# Patient Record
Sex: Female | Born: 1945 | Race: White | Hispanic: No | Smoking: Never smoker
Health system: Southern US, Community
[De-identification: ages and names within clinical notes are randomized; demographics above are authoritative.]

## PROBLEM LIST (undated history)

## (undated) DIAGNOSIS — K219 Gastro-esophageal reflux disease without esophagitis: Secondary | ICD-10-CM

## (undated) DIAGNOSIS — T4145XA Adverse effect of unspecified anesthetic, initial encounter: Secondary | ICD-10-CM

## (undated) DIAGNOSIS — R569 Unspecified convulsions: Secondary | ICD-10-CM

## (undated) DIAGNOSIS — F419 Anxiety disorder, unspecified: Secondary | ICD-10-CM

## (undated) DIAGNOSIS — F329 Major depressive disorder, single episode, unspecified: Secondary | ICD-10-CM

## (undated) DIAGNOSIS — G9332 Myalgic encephalomyelitis/chronic fatigue syndrome: Secondary | ICD-10-CM

## (undated) DIAGNOSIS — H3530 Unspecified macular degeneration: Secondary | ICD-10-CM

## (undated) DIAGNOSIS — K5790 Diverticulosis of intestine, part unspecified, without perforation or abscess without bleeding: Secondary | ICD-10-CM

## (undated) DIAGNOSIS — Z8719 Personal history of other diseases of the digestive system: Secondary | ICD-10-CM

## (undated) DIAGNOSIS — T8859XA Other complications of anesthesia, initial encounter: Secondary | ICD-10-CM

## (undated) DIAGNOSIS — L309 Dermatitis, unspecified: Secondary | ICD-10-CM

## (undated) DIAGNOSIS — K589 Irritable bowel syndrome without diarrhea: Secondary | ICD-10-CM

## (undated) DIAGNOSIS — K648 Other hemorrhoids: Secondary | ICD-10-CM

## (undated) DIAGNOSIS — M1990 Unspecified osteoarthritis, unspecified site: Secondary | ICD-10-CM

## (undated) DIAGNOSIS — F32A Depression, unspecified: Secondary | ICD-10-CM

## (undated) DIAGNOSIS — R5382 Chronic fatigue, unspecified: Secondary | ICD-10-CM

## (undated) HISTORY — PX: THERAPEUTIC ABORTION: SHX798

## (undated) HISTORY — DX: Dermatitis, unspecified: L30.9

## (undated) HISTORY — DX: Diverticulosis of intestine, part unspecified, without perforation or abscess without bleeding: K57.90

## (undated) HISTORY — PX: BUNIONECTOMY: SHX129

## (undated) HISTORY — DX: Other hemorrhoids: K64.8

## (undated) HISTORY — PX: FACELIFT W/BLEPHAROPLASTY: SHX1568

## (undated) HISTORY — PX: BLADDER SUSPENSION: SHX72

## (undated) HISTORY — PX: ABDOMINAL HYSTERECTOMY: SHX81

## (undated) HISTORY — PX: NASAL RECONSTRUCTION: SHX2069

## (undated) HISTORY — PX: CATARACT EXTRACTION, BILATERAL: SHX1313

---

## 1999-11-22 ENCOUNTER — Emergency Department (HOSPITAL_COMMUNITY): Admission: EM | Admit: 1999-11-22 | Discharge: 1999-11-22 | Payer: Self-pay | Admitting: Emergency Medicine

## 2000-05-02 ENCOUNTER — Emergency Department (HOSPITAL_COMMUNITY): Admission: EM | Admit: 2000-05-02 | Discharge: 2000-05-02 | Payer: Self-pay | Admitting: *Deleted

## 2001-08-07 ENCOUNTER — Emergency Department (HOSPITAL_COMMUNITY): Admission: EM | Admit: 2001-08-07 | Discharge: 2001-08-07 | Payer: Self-pay | Admitting: Emergency Medicine

## 2001-11-03 ENCOUNTER — Ambulatory Visit (HOSPITAL_COMMUNITY): Admission: RE | Admit: 2001-11-03 | Discharge: 2001-11-03 | Payer: Self-pay | Admitting: Gastroenterology

## 2001-11-07 ENCOUNTER — Emergency Department (HOSPITAL_COMMUNITY): Admission: EM | Admit: 2001-11-07 | Discharge: 2001-11-07 | Payer: Self-pay | Admitting: Emergency Medicine

## 2002-07-01 ENCOUNTER — Emergency Department (HOSPITAL_COMMUNITY): Admission: EM | Admit: 2002-07-01 | Discharge: 2002-07-01 | Payer: Self-pay | Admitting: Emergency Medicine

## 2003-10-20 ENCOUNTER — Emergency Department (HOSPITAL_COMMUNITY): Admission: EM | Admit: 2003-10-20 | Discharge: 2003-10-20 | Payer: Self-pay | Admitting: Emergency Medicine

## 2003-10-25 ENCOUNTER — Encounter: Admission: RE | Admit: 2003-10-25 | Discharge: 2003-12-06 | Payer: Self-pay | Admitting: Podiatry

## 2003-11-23 ENCOUNTER — Encounter: Payer: Self-pay | Admitting: Gastroenterology

## 2003-11-30 ENCOUNTER — Ambulatory Visit (HOSPITAL_COMMUNITY): Admission: RE | Admit: 2003-11-30 | Discharge: 2003-11-30 | Payer: Self-pay | Admitting: Gastroenterology

## 2003-11-30 ENCOUNTER — Encounter: Payer: Self-pay | Admitting: Gastroenterology

## 2003-12-01 ENCOUNTER — Emergency Department (HOSPITAL_COMMUNITY): Admission: EM | Admit: 2003-12-01 | Discharge: 2003-12-01 | Payer: Self-pay | Admitting: Emergency Medicine

## 2003-12-16 ENCOUNTER — Other Ambulatory Visit: Admission: RE | Admit: 2003-12-16 | Discharge: 2003-12-16 | Payer: Self-pay | Admitting: Family Medicine

## 2003-12-16 ENCOUNTER — Encounter: Admission: RE | Admit: 2003-12-16 | Discharge: 2003-12-16 | Payer: Self-pay | Admitting: Family Medicine

## 2004-01-04 ENCOUNTER — Encounter: Admission: RE | Admit: 2004-01-04 | Discharge: 2004-01-04 | Payer: Self-pay | Admitting: Family Medicine

## 2004-01-05 ENCOUNTER — Encounter: Admission: RE | Admit: 2004-01-05 | Discharge: 2004-01-05 | Payer: Self-pay | Admitting: Family Medicine

## 2004-01-10 ENCOUNTER — Encounter: Admission: RE | Admit: 2004-01-10 | Discharge: 2004-01-10 | Payer: Self-pay | Admitting: Sports Medicine

## 2004-02-14 ENCOUNTER — Encounter: Admission: RE | Admit: 2004-02-14 | Discharge: 2004-02-14 | Payer: Self-pay | Admitting: Sports Medicine

## 2004-02-17 ENCOUNTER — Encounter: Admission: RE | Admit: 2004-02-17 | Discharge: 2004-02-17 | Payer: Self-pay | Admitting: Family Medicine

## 2004-03-09 ENCOUNTER — Encounter: Admission: RE | Admit: 2004-03-09 | Discharge: 2004-03-09 | Payer: Self-pay | Admitting: Family Medicine

## 2004-07-11 ENCOUNTER — Ambulatory Visit: Payer: Self-pay | Admitting: Family Medicine

## 2004-08-30 ENCOUNTER — Ambulatory Visit: Payer: Self-pay | Admitting: Family Medicine

## 2004-09-08 ENCOUNTER — Ambulatory Visit: Payer: Self-pay | Admitting: Sports Medicine

## 2004-09-08 ENCOUNTER — Encounter: Admission: RE | Admit: 2004-09-08 | Discharge: 2004-09-08 | Payer: Self-pay | Admitting: Sports Medicine

## 2004-09-26 ENCOUNTER — Ambulatory Visit: Payer: Self-pay | Admitting: Sports Medicine

## 2005-01-11 ENCOUNTER — Ambulatory Visit: Payer: Self-pay | Admitting: Family Medicine

## 2005-01-12 ENCOUNTER — Emergency Department (HOSPITAL_COMMUNITY): Admission: EM | Admit: 2005-01-12 | Discharge: 2005-01-12 | Payer: Self-pay | Admitting: Emergency Medicine

## 2005-01-19 ENCOUNTER — Ambulatory Visit: Payer: Self-pay | Admitting: Family Medicine

## 2005-01-26 ENCOUNTER — Ambulatory Visit: Payer: Self-pay | Admitting: Family Medicine

## 2005-04-02 ENCOUNTER — Emergency Department (HOSPITAL_COMMUNITY): Admission: EM | Admit: 2005-04-02 | Discharge: 2005-04-03 | Payer: Self-pay | Admitting: Emergency Medicine

## 2005-04-04 ENCOUNTER — Ambulatory Visit: Payer: Self-pay | Admitting: Family Medicine

## 2005-04-06 ENCOUNTER — Encounter: Admission: RE | Admit: 2005-04-06 | Discharge: 2005-04-06 | Payer: Self-pay | Admitting: Sports Medicine

## 2005-04-18 ENCOUNTER — Ambulatory Visit (HOSPITAL_COMMUNITY): Admission: RE | Admit: 2005-04-18 | Discharge: 2005-04-18 | Payer: Self-pay | Admitting: Orthopedic Surgery

## 2005-04-18 ENCOUNTER — Other Ambulatory Visit: Admission: RE | Admit: 2005-04-18 | Discharge: 2005-04-18 | Payer: Self-pay | Admitting: Obstetrics and Gynecology

## 2005-11-05 ENCOUNTER — Ambulatory Visit: Payer: Self-pay | Admitting: Gastroenterology

## 2005-11-07 ENCOUNTER — Ambulatory Visit: Payer: Self-pay | Admitting: Gastroenterology

## 2005-11-07 ENCOUNTER — Ambulatory Visit (HOSPITAL_COMMUNITY): Admission: RE | Admit: 2005-11-07 | Discharge: 2005-11-07 | Payer: Self-pay | Admitting: Gastroenterology

## 2005-11-07 ENCOUNTER — Encounter (INDEPENDENT_AMBULATORY_CARE_PROVIDER_SITE_OTHER): Payer: Self-pay | Admitting: Specialist

## 2005-11-18 ENCOUNTER — Encounter (INDEPENDENT_AMBULATORY_CARE_PROVIDER_SITE_OTHER): Payer: Self-pay | Admitting: *Deleted

## 2005-11-18 LAB — CONVERTED CEMR LAB

## 2005-11-21 ENCOUNTER — Emergency Department (HOSPITAL_COMMUNITY): Admission: EM | Admit: 2005-11-21 | Discharge: 2005-11-21 | Payer: Self-pay | Admitting: Family Medicine

## 2005-12-03 ENCOUNTER — Ambulatory Visit: Payer: Self-pay | Admitting: Sports Medicine

## 2005-12-03 ENCOUNTER — Other Ambulatory Visit: Admission: RE | Admit: 2005-12-03 | Discharge: 2005-12-03 | Payer: Self-pay | Admitting: Family Medicine

## 2005-12-10 ENCOUNTER — Ambulatory Visit: Payer: Self-pay | Admitting: Gastroenterology

## 2006-01-15 ENCOUNTER — Ambulatory Visit: Payer: Self-pay | Admitting: Gastroenterology

## 2006-06-10 ENCOUNTER — Ambulatory Visit: Payer: Self-pay | Admitting: Family Medicine

## 2006-10-17 DIAGNOSIS — F411 Generalized anxiety disorder: Secondary | ICD-10-CM | POA: Insufficient documentation

## 2006-10-17 DIAGNOSIS — K5732 Diverticulitis of large intestine without perforation or abscess without bleeding: Secondary | ICD-10-CM | POA: Insufficient documentation

## 2006-10-17 DIAGNOSIS — K219 Gastro-esophageal reflux disease without esophagitis: Secondary | ICD-10-CM | POA: Insufficient documentation

## 2006-10-17 DIAGNOSIS — K649 Unspecified hemorrhoids: Secondary | ICD-10-CM | POA: Insufficient documentation

## 2006-10-17 DIAGNOSIS — K589 Irritable bowel syndrome without diarrhea: Secondary | ICD-10-CM

## 2006-10-17 DIAGNOSIS — F339 Major depressive disorder, recurrent, unspecified: Secondary | ICD-10-CM | POA: Insufficient documentation

## 2006-10-18 ENCOUNTER — Encounter (INDEPENDENT_AMBULATORY_CARE_PROVIDER_SITE_OTHER): Payer: Self-pay | Admitting: *Deleted

## 2006-10-22 ENCOUNTER — Telehealth: Payer: Self-pay | Admitting: *Deleted

## 2006-10-23 ENCOUNTER — Ambulatory Visit: Payer: Self-pay | Admitting: Family Medicine

## 2006-10-23 DIAGNOSIS — M19049 Primary osteoarthritis, unspecified hand: Secondary | ICD-10-CM | POA: Insufficient documentation

## 2007-01-01 ENCOUNTER — Telehealth: Payer: Self-pay | Admitting: *Deleted

## 2007-01-03 ENCOUNTER — Encounter (INDEPENDENT_AMBULATORY_CARE_PROVIDER_SITE_OTHER): Payer: Self-pay | Admitting: *Deleted

## 2007-01-03 ENCOUNTER — Ambulatory Visit: Payer: Self-pay | Admitting: Family Medicine

## 2007-01-03 ENCOUNTER — Encounter (INDEPENDENT_AMBULATORY_CARE_PROVIDER_SITE_OTHER): Payer: Self-pay | Admitting: Family Medicine

## 2007-01-03 DIAGNOSIS — K2970 Gastritis, unspecified, without bleeding: Secondary | ICD-10-CM | POA: Insufficient documentation

## 2007-01-03 DIAGNOSIS — K2990 Gastroduodenitis, unspecified, without bleeding: Secondary | ICD-10-CM

## 2007-01-03 LAB — CONVERTED CEMR LAB
ALT: 15 units/L (ref 0–35)
AST: 22 units/L (ref 0–37)
Albumin: 4.4 g/dL (ref 3.5–5.2)
BUN: 22 mg/dL (ref 6–23)
CO2: 25 meq/L (ref 19–32)
Calcium: 9.7 mg/dL (ref 8.4–10.5)
Chloride: 106 meq/L (ref 96–112)
Potassium: 4.1 meq/L (ref 3.5–5.3)

## 2007-01-04 ENCOUNTER — Encounter (INDEPENDENT_AMBULATORY_CARE_PROVIDER_SITE_OTHER): Payer: Self-pay | Admitting: Family Medicine

## 2007-01-06 ENCOUNTER — Telehealth: Payer: Self-pay | Admitting: *Deleted

## 2007-01-10 ENCOUNTER — Telehealth: Payer: Self-pay | Admitting: *Deleted

## 2007-01-11 ENCOUNTER — Emergency Department (HOSPITAL_COMMUNITY): Admission: EM | Admit: 2007-01-11 | Discharge: 2007-01-11 | Payer: Self-pay | Admitting: Emergency Medicine

## 2007-02-11 ENCOUNTER — Ambulatory Visit: Payer: Self-pay | Admitting: Family Medicine

## 2007-02-24 ENCOUNTER — Emergency Department (HOSPITAL_COMMUNITY): Admission: EM | Admit: 2007-02-24 | Discharge: 2007-02-24 | Payer: Self-pay | Admitting: Family Medicine

## 2007-02-25 ENCOUNTER — Encounter (INDEPENDENT_AMBULATORY_CARE_PROVIDER_SITE_OTHER): Payer: Self-pay | Admitting: Family Medicine

## 2007-03-03 ENCOUNTER — Encounter (INDEPENDENT_AMBULATORY_CARE_PROVIDER_SITE_OTHER): Payer: Self-pay | Admitting: Family Medicine

## 2007-03-03 ENCOUNTER — Ambulatory Visit: Payer: Self-pay | Admitting: Family Medicine

## 2007-03-04 ENCOUNTER — Encounter (INDEPENDENT_AMBULATORY_CARE_PROVIDER_SITE_OTHER): Payer: Self-pay | Admitting: Family Medicine

## 2007-03-04 LAB — CONVERTED CEMR LAB
Cholesterol: 184 mg/dL (ref 0–200)
HDL: 41 mg/dL (ref >39)

## 2007-03-17 ENCOUNTER — Telehealth: Payer: Self-pay | Admitting: *Deleted

## 2007-03-18 ENCOUNTER — Encounter (INDEPENDENT_AMBULATORY_CARE_PROVIDER_SITE_OTHER): Payer: Self-pay | Admitting: *Deleted

## 2007-03-26 ENCOUNTER — Encounter (INDEPENDENT_AMBULATORY_CARE_PROVIDER_SITE_OTHER): Payer: Self-pay | Admitting: Family Medicine

## 2007-04-18 ENCOUNTER — Encounter (INDEPENDENT_AMBULATORY_CARE_PROVIDER_SITE_OTHER): Payer: Self-pay | Admitting: Family Medicine

## 2007-04-24 ENCOUNTER — Encounter (INDEPENDENT_AMBULATORY_CARE_PROVIDER_SITE_OTHER): Payer: Self-pay | Admitting: Family Medicine

## 2007-04-24 ENCOUNTER — Other Ambulatory Visit: Admission: RE | Admit: 2007-04-24 | Discharge: 2007-04-24 | Payer: Self-pay | Admitting: Family Medicine

## 2007-04-24 ENCOUNTER — Ambulatory Visit: Payer: Self-pay | Admitting: Sports Medicine

## 2007-04-24 DIAGNOSIS — R599 Enlarged lymph nodes, unspecified: Secondary | ICD-10-CM | POA: Insufficient documentation

## 2007-04-24 DIAGNOSIS — N329 Bladder disorder, unspecified: Secondary | ICD-10-CM | POA: Insufficient documentation

## 2007-04-24 LAB — CONVERTED CEMR LAB: Whiff Test: NEGATIVE

## 2007-04-28 ENCOUNTER — Telehealth: Payer: Self-pay | Admitting: *Deleted

## 2007-04-28 ENCOUNTER — Ambulatory Visit: Payer: Self-pay | Admitting: Family Medicine

## 2007-04-28 LAB — CONVERTED CEMR LAB
Bilirubin Urine: NEGATIVE
Glucose, Urine, Semiquant: NEGATIVE
Ketones, urine, test strip: NEGATIVE
Specific Gravity, Urine: 1.015
pH: 8

## 2007-04-30 ENCOUNTER — Ambulatory Visit: Payer: Self-pay | Admitting: Cardiology

## 2007-05-02 ENCOUNTER — Encounter: Admission: RE | Admit: 2007-05-02 | Discharge: 2007-05-02 | Payer: Self-pay | Admitting: Sports Medicine

## 2007-05-04 ENCOUNTER — Emergency Department (HOSPITAL_COMMUNITY): Admission: EM | Admit: 2007-05-04 | Discharge: 2007-05-04 | Payer: Self-pay | Admitting: Emergency Medicine

## 2007-06-12 ENCOUNTER — Telehealth (INDEPENDENT_AMBULATORY_CARE_PROVIDER_SITE_OTHER): Payer: Self-pay | Admitting: Family Medicine

## 2008-04-21 ENCOUNTER — Telehealth: Payer: Self-pay | Admitting: *Deleted

## 2008-04-28 ENCOUNTER — Telehealth: Payer: Self-pay | Admitting: *Deleted

## 2008-04-29 ENCOUNTER — Ambulatory Visit: Payer: Self-pay | Admitting: Family Medicine

## 2008-04-29 DIAGNOSIS — N8111 Cystocele, midline: Secondary | ICD-10-CM | POA: Insufficient documentation

## 2008-04-29 LAB — CONVERTED CEMR LAB
Bilirubin Urine: NEGATIVE
Blood in Urine, dipstick: NEGATIVE
Ketones, urine, test strip: NEGATIVE
Protein, U semiquant: NEGATIVE
Urobilinogen, UA: 0.2

## 2008-04-30 ENCOUNTER — Telehealth: Payer: Self-pay | Admitting: *Deleted

## 2008-05-11 ENCOUNTER — Encounter (INDEPENDENT_AMBULATORY_CARE_PROVIDER_SITE_OTHER): Payer: Self-pay | Admitting: Family Medicine

## 2008-06-03 ENCOUNTER — Ambulatory Visit: Payer: Self-pay | Admitting: Obstetrics & Gynecology

## 2008-06-03 ENCOUNTER — Other Ambulatory Visit: Admission: RE | Admit: 2008-06-03 | Discharge: 2008-06-03 | Payer: Self-pay | Admitting: Obstetrics & Gynecology

## 2008-06-04 ENCOUNTER — Encounter (INDEPENDENT_AMBULATORY_CARE_PROVIDER_SITE_OTHER): Payer: Self-pay | Admitting: Family Medicine

## 2008-06-04 ENCOUNTER — Ambulatory Visit: Payer: Self-pay | Admitting: Family Medicine

## 2008-06-04 ENCOUNTER — Other Ambulatory Visit: Admission: RE | Admit: 2008-06-04 | Discharge: 2008-06-04 | Payer: Self-pay | Admitting: Family Medicine

## 2008-06-11 ENCOUNTER — Encounter (INDEPENDENT_AMBULATORY_CARE_PROVIDER_SITE_OTHER): Payer: Self-pay | Admitting: Family Medicine

## 2008-06-11 ENCOUNTER — Ambulatory Visit (HOSPITAL_COMMUNITY): Admission: RE | Admit: 2008-06-11 | Discharge: 2008-06-11 | Payer: Self-pay | Admitting: Family Medicine

## 2008-06-11 ENCOUNTER — Ambulatory Visit: Payer: Self-pay | Admitting: Family Medicine

## 2008-06-14 ENCOUNTER — Ambulatory Visit (HOSPITAL_COMMUNITY): Admission: RE | Admit: 2008-06-14 | Discharge: 2008-06-14 | Payer: Self-pay | Admitting: Obstetrics and Gynecology

## 2008-07-08 ENCOUNTER — Telehealth (INDEPENDENT_AMBULATORY_CARE_PROVIDER_SITE_OTHER): Payer: Self-pay | Admitting: Family Medicine

## 2008-07-14 ENCOUNTER — Ambulatory Visit: Payer: Self-pay | Admitting: Obstetrics & Gynecology

## 2008-08-27 ENCOUNTER — Telehealth: Payer: Self-pay | Admitting: *Deleted

## 2008-10-16 ENCOUNTER — Telehealth (INDEPENDENT_AMBULATORY_CARE_PROVIDER_SITE_OTHER): Payer: Self-pay | Admitting: Family Medicine

## 2008-10-30 ENCOUNTER — Emergency Department (HOSPITAL_COMMUNITY): Admission: EM | Admit: 2008-10-30 | Discharge: 2008-10-30 | Payer: Self-pay | Admitting: Emergency Medicine

## 2008-11-24 ENCOUNTER — Ambulatory Visit: Payer: Self-pay | Admitting: Obstetrics and Gynecology

## 2008-11-25 ENCOUNTER — Encounter: Payer: Self-pay | Admitting: Obstetrics and Gynecology

## 2008-11-25 LAB — CONVERTED CEMR LAB
Clue Cells Wet Prep HPF POC: NONE SEEN
GC Probe Amp, Genital: NEGATIVE

## 2008-12-21 ENCOUNTER — Emergency Department (HOSPITAL_COMMUNITY): Admission: EM | Admit: 2008-12-21 | Discharge: 2008-12-21 | Payer: Self-pay | Admitting: Emergency Medicine

## 2009-01-10 ENCOUNTER — Encounter (INDEPENDENT_AMBULATORY_CARE_PROVIDER_SITE_OTHER): Payer: Self-pay | Admitting: Family Medicine

## 2009-07-01 ENCOUNTER — Emergency Department (HOSPITAL_COMMUNITY): Admission: EM | Admit: 2009-07-01 | Discharge: 2009-07-01 | Payer: Self-pay | Admitting: Emergency Medicine

## 2009-10-06 ENCOUNTER — Ambulatory Visit: Payer: Self-pay | Admitting: Family Medicine

## 2009-10-06 ENCOUNTER — Encounter (INDEPENDENT_AMBULATORY_CARE_PROVIDER_SITE_OTHER): Payer: Self-pay | Admitting: *Deleted

## 2009-10-07 ENCOUNTER — Ambulatory Visit: Payer: Self-pay | Admitting: Gastroenterology

## 2009-10-07 ENCOUNTER — Telehealth: Payer: Self-pay | Admitting: Gastroenterology

## 2009-10-07 DIAGNOSIS — R198 Other specified symptoms and signs involving the digestive system and abdomen: Secondary | ICD-10-CM

## 2009-10-10 ENCOUNTER — Ambulatory Visit: Payer: Self-pay | Admitting: Gastroenterology

## 2009-10-12 ENCOUNTER — Telehealth: Payer: Self-pay | Admitting: Gastroenterology

## 2009-10-17 ENCOUNTER — Telehealth: Payer: Self-pay | Admitting: Gastroenterology

## 2009-10-28 ENCOUNTER — Ambulatory Visit: Payer: Self-pay | Admitting: Gastroenterology

## 2009-11-14 ENCOUNTER — Ambulatory Visit: Payer: Self-pay | Admitting: Family Medicine

## 2009-11-14 DIAGNOSIS — R1012 Left upper quadrant pain: Secondary | ICD-10-CM

## 2009-11-14 LAB — CONVERTED CEMR LAB
Bilirubin Urine: NEGATIVE
Hemoglobin: 12.1 g/dL (ref 12.0–15.0)
Lymphocytes Relative: 37 % (ref 12–46)
Monocytes Absolute: 0.5 10*3/uL (ref 0.1–1.0)
Neutro Abs: 4.6 10*3/uL (ref 1.7–7.7)
Nitrite: NEGATIVE
Platelets: 265 10*3/uL (ref 150–400)
RDW: 12.5 % (ref 11.5–15.5)
WBC Urine, dipstick: NEGATIVE
pH: 6

## 2009-11-15 LAB — CONVERTED CEMR LAB
Albumin: 4.4 g/dL (ref 3.5–5.2)
BUN: 16 mg/dL (ref 6–23)
Calcium: 9.4 mg/dL (ref 8.4–10.5)
Chloride: 104 meq/L (ref 96–112)
Glucose, Bld: 116 mg/dL — ABNORMAL HIGH (ref 70–99)
Potassium: 4 meq/L (ref 3.5–5.3)
TSH: 6.298 microintl units/mL — ABNORMAL HIGH (ref 0.350–4.500)

## 2009-11-16 ENCOUNTER — Telehealth: Payer: Self-pay | Admitting: Family Medicine

## 2009-11-17 ENCOUNTER — Ambulatory Visit: Payer: Self-pay | Admitting: Family Medicine

## 2009-11-17 DIAGNOSIS — R5383 Other fatigue: Secondary | ICD-10-CM

## 2009-11-17 DIAGNOSIS — E559 Vitamin D deficiency, unspecified: Secondary | ICD-10-CM | POA: Insufficient documentation

## 2009-11-17 DIAGNOSIS — E079 Disorder of thyroid, unspecified: Secondary | ICD-10-CM | POA: Insufficient documentation

## 2009-11-17 DIAGNOSIS — R5381 Other malaise: Secondary | ICD-10-CM | POA: Insufficient documentation

## 2009-11-18 LAB — CONVERTED CEMR LAB
Free T4: 0.9 ng/dL (ref 0.80–1.80)
T3, Free: 2.9 pg/mL (ref 2.3–4.2)

## 2009-11-21 ENCOUNTER — Encounter: Payer: Self-pay | Admitting: Family Medicine

## 2009-11-22 ENCOUNTER — Encounter: Payer: Self-pay | Admitting: Family Medicine

## 2009-11-22 LAB — CONVERTED CEMR LAB
Iron: 92 ug/dL (ref 42–145)
Vitamin B-12: 661 pg/mL (ref 211–911)

## 2009-11-23 LAB — CONVERTED CEMR LAB
Basophils Absolute: 0 10*3/uL (ref 0.0–0.1)
Cholesterol: 194 mg/dL (ref 0–200)
Eosinophils Relative: 2 % (ref 0–5)
LDL Cholesterol: 132 mg/dL — ABNORMAL HIGH (ref 0–99)
Lymphocytes Relative: 40 % (ref 12–46)
Neutro Abs: 3.3 10*3/uL (ref 1.7–7.7)
Platelets: 250 10*3/uL (ref 150–400)
RDW: 13.1 % (ref 11.5–15.5)
Triglycerides: 74 mg/dL (ref <150)
Vit D, 25-Hydroxy: 32 ng/mL (ref 30–89)

## 2009-11-25 ENCOUNTER — Emergency Department (HOSPITAL_COMMUNITY): Admission: EM | Admit: 2009-11-25 | Discharge: 2009-11-25 | Payer: Self-pay | Admitting: Emergency Medicine

## 2009-12-01 ENCOUNTER — Ambulatory Visit: Payer: Self-pay | Admitting: Obstetrics & Gynecology

## 2009-12-12 ENCOUNTER — Ambulatory Visit (HOSPITAL_COMMUNITY): Admission: RE | Admit: 2009-12-12 | Discharge: 2009-12-12 | Payer: Self-pay | Admitting: Obstetrics & Gynecology

## 2009-12-22 ENCOUNTER — Encounter: Admission: RE | Admit: 2009-12-22 | Discharge: 2009-12-22 | Payer: Self-pay | Admitting: Obstetrics & Gynecology

## 2009-12-26 ENCOUNTER — Encounter: Payer: Self-pay | Admitting: Obstetrics & Gynecology

## 2009-12-26 ENCOUNTER — Inpatient Hospital Stay (HOSPITAL_COMMUNITY): Admission: RE | Admit: 2009-12-26 | Discharge: 2009-12-27 | Payer: Self-pay | Admitting: Obstetrics & Gynecology

## 2009-12-26 ENCOUNTER — Ambulatory Visit: Payer: Self-pay | Admitting: Obstetrics & Gynecology

## 2009-12-29 ENCOUNTER — Inpatient Hospital Stay (HOSPITAL_COMMUNITY): Admission: AD | Admit: 2009-12-29 | Discharge: 2009-12-29 | Payer: Self-pay | Admitting: Obstetrics & Gynecology

## 2009-12-29 ENCOUNTER — Ambulatory Visit: Payer: Self-pay | Admitting: Family Medicine

## 2010-01-02 ENCOUNTER — Ambulatory Visit: Payer: Self-pay | Admitting: Obstetrics & Gynecology

## 2010-01-03 ENCOUNTER — Inpatient Hospital Stay (HOSPITAL_COMMUNITY): Admission: AD | Admit: 2010-01-03 | Discharge: 2010-01-03 | Payer: Self-pay | Admitting: Obstetrics and Gynecology

## 2010-01-03 ENCOUNTER — Ambulatory Visit: Payer: Self-pay | Admitting: Family

## 2010-01-11 ENCOUNTER — Ambulatory Visit: Payer: Self-pay | Admitting: Obstetrics & Gynecology

## 2010-01-12 ENCOUNTER — Encounter: Payer: Self-pay | Admitting: Obstetrics & Gynecology

## 2010-02-07 ENCOUNTER — Ambulatory Visit: Payer: Self-pay | Admitting: Obstetrics & Gynecology

## 2010-02-28 ENCOUNTER — Encounter: Admission: RE | Admit: 2010-02-28 | Discharge: 2010-02-28 | Payer: Self-pay | Admitting: Family Medicine

## 2010-02-28 ENCOUNTER — Ambulatory Visit: Payer: Self-pay | Admitting: Family Medicine

## 2010-02-28 DIAGNOSIS — R1031 Right lower quadrant pain: Secondary | ICD-10-CM

## 2010-02-28 DIAGNOSIS — R339 Retention of urine, unspecified: Secondary | ICD-10-CM

## 2010-03-13 ENCOUNTER — Encounter: Payer: Self-pay | Admitting: Family Medicine

## 2010-06-30 ENCOUNTER — Ambulatory Visit: Payer: Self-pay | Admitting: Family Medicine

## 2010-06-30 DIAGNOSIS — R300 Dysuria: Secondary | ICD-10-CM

## 2010-06-30 LAB — CONVERTED CEMR LAB
Ketones, urine, test strip: NEGATIVE
Protein, U semiquant: NEGATIVE
pH: 6

## 2010-07-01 ENCOUNTER — Encounter: Payer: Self-pay | Admitting: Family Medicine

## 2010-07-05 ENCOUNTER — Encounter: Payer: Self-pay | Admitting: Family Medicine

## 2010-07-25 ENCOUNTER — Telehealth: Payer: Self-pay | Admitting: Family Medicine

## 2010-08-01 ENCOUNTER — Ambulatory Visit: Payer: Self-pay | Admitting: Obstetrics & Gynecology

## 2010-08-02 ENCOUNTER — Encounter: Payer: Self-pay | Admitting: Obstetrics & Gynecology

## 2010-09-21 NOTE — Progress Notes (Signed)
  Phone Note Call from Patient Call back at Home Phone [phone]   Caller: Patient Reason for Call: Lab or Test Results Details of Action Taken: Referred to Dr. Patrcia Dolly office Summary of Call: I spoke with Shelley [name] on 11/16/09 in regards to her lab work. I informed her of her thyroid level and the note you added to the results. After explaining the symptoms of hypothyroidism she said they fit her exactly. She does not have a PCP so I referred her to Dr. Shelah Lewandowsky office for possible treatment and told her I would inform you of our call.  Initial call taken by: Lajean Saver RN,  November 16, 2009 1:42 PM

## 2010-09-21 NOTE — Assessment & Plan Note (Signed)
Summary: Frequent, painful urination x 1 wk rm 4   Vital Signs:  Patient Profile:   65 Years Old Female CC:      Frequent, painful urination x 1 wk Height:     64.25 inches Weight:      148 pounds O2 Sat:      100 % O2 treatment:    Room Air Temp:     98.0 degrees F oral Pulse rate:   84 / minute Pulse rhythm:   regular Resp:     16 per minute BP sitting:   109 / 75  (left arm) Cuff size:   regular  Vitals Entered By: Areta Haber CMA (June 30, 2010 4:27 PM)                  Current Allergies: ! PCN ! ASA ! AMOXICILLIN ! DEMEROL ! BETADINE     History of Present Illness Chief Complaint: Frequent, painful urination x 1 wk History of Present Illness:  Subjective:  Patient complains of increased dysuria for about a week.  She has a history of chronic urinary retention and incontinence, followed by [facility].  She normally catheterizes herself, and has been having increased discomfort.  No fevers, chills, and sweats.  No nausea/vomiting.  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) BLADDER PROLAPSE (ICD-596.9) SYMPTOM, ENLARGEMENT, LYMPH NODES (ICD-785.6) GASTRITIS (ICD-535.50) OSTEOARTHROSIS NOS, HAND (ICD-715.94) IRRITABLE BOWEL SYNDROME (ICD-564.1) HEMORRHOIDS, NOS (ICD-455.6) GASTROESOPHAGEAL REFLUX, NO ESOPHAGITIS (ICD-530.81) DIVERTICULITIS OF COLON, NOS (ICD-562.11) DEPRESSION, MAJOR, RECURRENT (ICD-296.30) ANXIETY (ICD-300.00)   Current Meds AMITRIPTYLINE HCL 150 MG TABS (AMITRIPTYLINE HCL) Take 1 tablet by mouth at bedtime PEPTO-BISMOL 262 MG TABS (BISMUTH  SUBSALICYLATE) Take as needed GAS-X EXTRA STRENGTH 125 MG CAPS (SIMETHICONE) Take as needed FAMOTIDINE 40 MG TABS (FAMOTIDINE) one tablet by mouth once daily ATIVAN 0.5 MG TABS (LORAZEPAM) Take 1 tab by mouth three times a day  as needed anxiety MACROBID 100 MG CAPS (NITROFURANTOIN MONOHYD MACRO) 1 by mouth q12hr with food PYRIDIUM 200 MG TABS (PHENAZOPYRIDINE HCL) 1 by mouth three times a day pc  REVIEW OF SYSTEMS Constitutional Symptoms      Denies fever, chills, night sweats, weight loss, weight gain, and fatigue.  Eyes       Denies change in vision, eye pain, eye discharge, glasses, contact lenses, and eye surgery. Ear/Nose/Throat/Mouth       Denies hearing loss/aids, change in hearing, ear pain, ear discharge, dizziness, frequent runny nose, frequent nose bleeds, sinus problems, sore throat, hoarseness, and tooth pain or bleeding.  Respiratory       Denies dry cough, productive cough, wheezing, shortness of breath, asthma, bronchitis, and emphysema/COPD.  Cardiovascular       Denies murmurs, chest pain, and tires easily with exhertion.    Gastrointestinal       Denies stomach pain, nausea/vomiting, diarrhea, constipation, blood in bowel movements, and indigestion. Genitourniary       Complains of painful urination.      Denies kidney stones and loss of urinary control.      Comments: Frequent x 1 wk Neurological       Denies paralysis, seizures, and fainting/blackouts. Musculoskeletal       Denies muscle pain, joint pain, joint stiffness, decreased range of motion, redness, swelling,  muscle weakness, and gout.  Skin       Denies bruising, unusual mles/lumps or sores, and hair/skin or nail changes.  Psych       Denies mood changes, temper/anger issues, anxiety/stress, speech problems, depression, and sleep problems. Other Comments: Pt states she has been out of town x 2wks and has not seen her Urologist or PCP for this.   Past History:  Past Medical History: Last updated:  11/17/2009 Depression, Major recurrent--  [facility] in [place] Anxiety GERD IBS Diverticuliis of colon Hemmerhoids Osteoarthritis ? 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) VAGINAL DISCHARGE (ICD-623.5) DYSURIA (ICD-788.1) BLADDER PROLAPSE (ICD-596.9) SCREENING FOR MALIGNANT NEOPLASM, CERVIX (ICD-V76.2) SYMPTOM, ENLARGEMENT, LYMPH NODES (ICD-785.6) HEALTH SCREENING (ICD-V70.0) GASTRITIS (ICD-535.50) OSTEOARTHROSIS NOS, HAND (ICD-715.94) IRRITABLE BOWEL SYNDROME (ICD-564.1) HEMORRHOIDS, NOS (ICD-455.6) GASTROESOPHAGEAL REFLUX, NO ESOPHAGITIS (ICD-530.81) DIVERTICULITIS OF COLON, NOS (ICD-562.11) DEPRESSION, MAJOR, RECURRENT (ICD-296.30) ANXIETY (ICD-300.00)    Past Surgical History: Last updated: 10/07/2009 Bunion surgery bilateral - 12/16/2003 Colonoscopy -  diverticuli - 12/16/2003,  Endoscopy - 12/16/2003, 12/2006-mild H. Pylori gastritis esophagial dilation - 12/16/2003,  hemorroid banding - 12/16/2003,  Lipoma removal axillary `79/84 - 12/16/2003 rhinoplasty  Family History: Last updated: 10/07/2009 dad with chf, htn, stroke? Mom had COPD. Dad and brother with Gout. Maternal GM w/ CVA Family History of Esophageal Cancer: Cousin No FH of Colon Cancer: Family History of Colon Polyps: Mother, Brother Family History of Stomach Cancer: Maternal Great Grandmother, Uncle??? Family History of Diabetes: Father, Paternal Aunts, Brother Family History of Heart Disease: Father Family History of Clotting disorder: Brother  Social History: Last updated: 10/07/2009 exercises by Sarina Ser, ab lounge.  on disability;  going through a very compicated difficult divorce. Patient has never smoked.  Alcohol Use - no Illicit Drug Use - no  Risk Factors: Smoking Status: never (10/07/2009)   Objective:  No acute distress  Eyes:  Pupils are equal, round, and reactive to light and  accomdation.  Extraocular movement is intact.  Conjunctivae are not inflamed.  Mouth:  moist mucous membranes  Neck:  Supple.  No adenopathy is present. Lungs:  Clear to auscultation.  Breath sounds are equal.  Heart:  Regular rate and rhythm without murmurs, rubs, or gallops.  Abdomen:   Mild suprapubic tenderness without masses or hepatosplenomegaly.  Bowel sounds are present.  No CVA or flank tenderness.  urinalysis (dipstick):  trace blood, +1 leuks Assessment New Problems: DYSURIA (ICD-788.1)  SUSPECT UTI  Plan New Medications/Changes: PYRIDIUM 200 MG TABS (PHENAZOPYRIDINE HCL) 1 by mouth three times a day pc  #9 x 0, 06/30/2010, Donna Christen MD MACROBID 100 MG CAPS (NITROFURANTOIN MONOHYD MACRO) 1 by mouth q12hr with food  #14 x 0, 06/30/2010, Donna Christen MD  New Orders: Urinalysis [81003-65000] T-Culture, Urine [[id number]] Est. Patient Level III [35009] Planning Comments:   Culture urine. Begin Macrobid and Pyridium. Follow-up with [facility].  To ER for increasing pain, fever, etc.   The patient and/or caregiver has been counseled thoroughly with regard to medications prescribed including dosage, schedule, interactions, rationale for use, and possible side effects and they verbalize understanding.  Diagnoses and expected course of recovery discussed and will return if not improved as expected or if the condition worsens. Patient and/or caregiver verbalized understanding.  Prescriptions: PYRIDIUM 200 MG TABS (PHENAZOPYRIDINE HCL) 1 by mouth three times a day pc  #9 x 0   Entered and Authorized by:   Donna Christen MD  Signed by:   Donna Christen MD on 06/30/2010   Method used:   Print then Give to Patient   RxID:   [id number] MACROBID 100 MG CAPS (NITROFURANTOIN MONOHYD MACRO) 1 by mouth q12hr with food  #14 x 0   Entered and Authorized by:   Donna Christen MD   Signed by:   Donna Christen MD on 06/30/2010   Method used:   Print then Give to  Patient   RxID:   [id number]   Orders Added: 1)  Urinalysis [81003-65000] 2)  T-Culture, Urine [[id number]] 3)  Est. Patient Level III [63875]    Laboratory Results   Urine Tests  Date/Time Received: June 30, 2010 4:50 PM  Date/Time Reported: June 30, 2010 4:50 PM   Routine Urinalysis   Color: lt. yellow Appearance: Hazy Glucose: negative   (Normal Range: Negative) Bilirubin: negative   (Normal Range: Negative) Ketone: negative   (Normal Range: Negative) Spec. Gravity: 1.015   (Normal Range: 1.003-1.035) Blood: trace-intact   (Normal Range: Negative) pH: 6.0   (Normal Range: 5.0-8.0) Protein: negative   (Normal Range: Negative) Urobilinogen: 0.2   (Normal Range: 0-1) Nitrite: negative   (Normal Range: Negative) Leukocyte Esterace: small   (Normal Range: Negative)

## 2010-09-21 NOTE — Assessment & Plan Note (Signed)
Summary: voiding problems   Vital Signs:  Patient profile:   65 year old female Height:      64.25 inches Weight:      133 pounds BMI:     22.73 O2 Sat:      96 % on Room air Pulse rate:   101 / minute BP sitting:   110 / 71  (left arm) Cuff size:   regular  Vitals Entered By: Payton Spark CMA (February 28, 2010 1:38 PM)  O2 Flow:  Room air CC: f/u thyroid   Primary Care Provider:  Seymour Bars DO  CC:  f/u thyroid.  History of Present Illness: 65 yo WF presents for f/u borderline hypothyroidism.  She never did start on her Synthroid 25 micrograms/ day and is still tired.    She had her bladder  sling with vaginal hysterectomy by Dr Marice Potter in May.  She is healing up but still having troiuble with leaking urine and getting her bladder to empty completely.  She is also having frequent stools and some constipation which is new along with RLQ pain on and off and more frequent stools.  Denies any vag bleeding but has some irritation and pain.  Denies rectal bleeding or blood in the urine.    She had a colonoscopy in Feb.  with Dr Arlyce Dice which showed diverticulosis and constipation.     Current Medications (verified): 1)  Amitriptyline Hcl 150 Mg Tabs (Amitriptyline Hcl) .... Take 1 Tablet By Mouth At Bedtime 2)  Pepto-Bismol 262 Mg Tabs (Bismuth Subsalicylate) .... Take As Needed 3)  Gas-X Extra Strength 125 Mg Caps (Simethicone) .... Take As Needed 4)  Famotidine 40 Mg Tabs (Famotidine) .... One Tablet By Mouth Once Daily 5)  Ativan 0.5 Mg Tabs (Lorazepam) .... Take 1 Tab By Mouth Three Times A Day  As Needed Anxiety 6)  Synthroid 25 Mcg Tabs (Levothyroxine Sodium) .Marland Kitchen.. 1 Tab By Mouth Daily As Directed  Allergies (verified): 1)  ! Pcn 2)  ! Asa 3)  ! Amoxicillin 4)  ! Demerol 5)  ! Betadine  Past History:  Past Medical History: Reviewed history from 11/17/2009 and no changes required. Depression, Major recurrent--  [facility] in [place] Anxiety GERD IBS Diverticuliis of  colon Hemmerhoids Osteoarthritis ? 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 Bunion surgery bilateral - 12/16/2003 Colonoscopy -  diverticuli - 12/16/2003,  Endoscopy - 12/16/2003, 12/2006-mild H. Pylori gastritis esophagial dilation - 12/16/2003,  hemorroid banding - 12/16/2003,  Lipoma removal axillary `79/84 - 12/16/2003 rhinoplasty  Social History: Reviewed history from 10/07/2009 and no changes required. 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 Patient has never smoked.  Alcohol Use - no Illicit Drug Use - no  Review of Systems      See HPI  Physical Exam  General:  alert, well-developed, well-nourished, and well-hydrated.   Head:  normocephalic and atraumatic.   Eyes:  sclera non icteric Mouth:  pharynx pink and moist.   Neck:  no masses.  Lungs:  Normal respiratory effort, chest expands symmetrically. Lungs are clear to auscultation, no crackles or wheezes. Heart:  Normal rate and regular rhythm. S1 and S2 normal without gallop, murmur, click, rub or other extra sounds. Abdomen:  RLQ with vol guarding on deep palpation.  ND.  soft.  No HSM.  NABS Extremities:  no LE edema Skin:  color normal.   Psych:  good eye contact, not anxious appearing,  and not depressed appearing.     Impression & Recommendations:  Problem # 1:  RLQ PAIN (ICD-789.03) Started after surgery.  DDx includes constipation, adhesions.  Will get a flat/ upright abdomen due to recent surgery.  No sign of acute abdomen.  REviewed her recent colonoscopy with Dr Arlyce Dice in Feb.  May need to go back to see him if not improving. Her updated medication list for this problem includes:    Gas-x Extra Strength 125 Mg Caps (Simethicone) .Marland Kitchen... Take as needed  Orders: T-DG ABD 2 Views [phone])  Problem # 2:  INCOMPLETE VOIDING (ICD-788.21) After bladder sling, she continues to have some stress incontinence and incomplete emptying.  Will refer to urology for urodynamic studies. Orders: Urology Referral (Urology)  Problem # 3:  THYROID STIMULATING HORMONE, ABNORMAL (ICD-246.9) Subclinical hypothyroidism.  Given the option to start low dose synthroid a few mos ago due to fatigue but she never did fill the RX.    Complete Medication List: 1)  Amitriptyline Hcl 150 Mg Tabs (Amitriptyline hcl) .... Take 1 tablet by mouth at bedtime 2)  Pepto-bismol 262 Mg Tabs (Bismuth subsalicylate) .... Take as needed 3)  Gas-x Extra Strength 125 Mg Caps (Simethicone) .... Take as needed 4)  Famotidine 40 Mg Tabs (Famotidine) .... One tablet by mouth once daily 5)  Ativan 0.5 Mg Tabs (Lorazepam) .... Take 1 tab by mouth three times a day  as needed anxiety  Patient Instructions: 1)  Xray Abdomen downstairs today. 2)  Will call you w/ results tomorrow. 3)  Will get you in with urologist for voiding issues. 4)  I will send a note to Dr Arlyce Dice about your ongoing GI issues.

## 2010-09-21 NOTE — Procedures (Signed)
Summary: Colonoscopy  Patient: [name] Note: All result statuses are Final unless otherwise noted.  Tests: (1) Colonoscopy (COL)   COL Colonoscopy           DONE (C)     [facility]     [address]     [place], [place]  [address]           COLONOSCOPY PROCEDURE REPORT           PATIENT:  Shelley [name], Shelley [name]  MR#:  [id number]     BIRTHDATE:  1945/12/23, 63 yrs. old  GENDER:  female           ENDOSCOPIST:  Barbette Hair. Arlyce Dice, MD     Referred by:           PROCEDURE DATE:  10/10/2009     PROCEDURE:  Colonoscopy, Diagnostic     ASA CLASS:  Class II     INDICATIONS:  constipation           MEDICATIONS:   Fentanyl 75 mcg IV, Versed 10 mg IV, benadryl 25mg      IV (correction)           DESCRIPTION OF PROCEDURE:   After the risks benefits and     alternatives of the procedure were thoroughly explained, informed     consent was obtained.  Digital rectal exam was performed and     revealed no abnormalities.   The LB [id number] endoscope     was introduced through the anus and advanced to the cecum, which     was identified by both the appendix and ileocecal valve, without     limitations.  The quality of the prep was Moviprep fair.  The     instrument was then slowly withdrawn as the colon was fully     examined.     <<PROCEDUREIMAGES>>           FINDINGS:  Mild diverticulosis was found sigmoid to descending     This was otherwise a normal examination of the colon (see image2,     image3, image5, image6, image8, image9, image12, and image13).     Retroflexed views in the rectum revealed no abnormalities.    The     scope was then withdrawn from the patient and the procedure     completed.           COMPLICATIONS:  None           ENDOSCOPIC IMPRESSION:     1) Mild diverticulosis in the sigmoid to descending     2) Otherwise normal examination     3) Functional constipation     RECOMMENDATIONS:     1) call office next 1-3 days to schedule followup visit in  2-3     weeks     2) begin miralax 1-2 times daily every 3 days as needed           REPEAT EXAM:  No           ______________________________     Barbette Hair. Arlyce Dice, MD           CC:           n.     REVISED:  10/26/2009 10:41 AM     eSIGNED:   Barbette Hair. Kaplan at 10/26/2009 10:41 AM           Shelley [name], [id number]  Note: An exclamation mark (!) indicates a result that was not  dispersed into the flowsheet. Document Creation Date: 10/26/2009 10:42 AM _______________________________________________________________________  (1) Order result status: Final Collection or observation date-time: 10/10/2009 15:37 Requested date-time:  Receipt date-time:  Reported date-time:  Referring Physician:   Ordering Physician: Melvia Heaps [phone]) Specimen Source:  Source: Shelley [name] Order Number: [id number] Lab site:

## 2010-09-21 NOTE — Procedures (Signed)
Summary: Colonoscopy/[facility]  Colonoscopy/[facility]   Imported By: Sherian Rein 10/11/2009 09:43:43  _____________________________________________________________________  External Attachment:    Type:   Image     Comment:   External Document

## 2010-09-21 NOTE — Letter (Signed)
Summary: [facility] Instructions  [facility]  [address] [place], [place] [address]   Phone: [phone]  Fax: [phone]       Shelley [name]    1945-10-09    MRN: [id number]        Procedure Day /Date:MONDAY 10/10/2009     Arrival Time:2PM     Procedure Time:3PM     Location of Procedure:                    [facility] (4th Floor)   PREPARATION FOR COLONOSCOPY WITH MOVIPREP/ENDO   Starting 5 days prior to your procedure TODAY do not eat nuts, seeds, popcorn, corn, beans, peas,  salads, or any raw vegetables.  Do not take any fiber supplements (e.g. Metamucil, Citrucel, and Benefiber).  THE DAY BEFORE YOUR PROCEDURE         DATE:10/09/2009  DAY: SUNDAY  1.  Drink clear liquids the entire day-NO SOLID FOOD  2.  Do not drink anything colored red or purple.  Avoid juices with pulp.  No orange juice.  3.  Drink at least 64 oz. (8 glasses) of fluid/clear liquids during the day to prevent dehydration and help the prep work efficiently.  CLEAR LIQUIDS INCLUDE: Water Jello Ice Popsicles Tea (sugar ok, no milk/cream) Powdered fruit flavored drinks Coffee (sugar ok, no milk/cream) Gatorade Juice: apple, white grape, white cranberry  Lemonade Clear bullion, consomm, broth Carbonated beverages (any kind) Strained chicken noodle soup Hard Candy                             4.  In the morning, mix first dose of MoviPrep solution:    Empty 1 Pouch A and 1 Pouch B into the disposable container    Add lukewarm drinking water to the top line of the container. Mix to dissolve    Refrigerate (mixed solution should be used within 24 hrs)  5.  Begin drinking the prep at 5:00 p.m. The MoviPrep container is divided by 4 marks.   Every 15 minutes drink the solution down to the next mark (approximately 8 oz) until the full liter is complete.   6.  Follow completed prep with 16 oz of clear liquid of your choice (Nothing red or purple).  Continue to  drink clear liquids until bedtime.  7.  Before going to bed, mix second dose of MoviPrep solution:    Empty 1 Pouch A and 1 Pouch B into the disposable container    Add lukewarm drinking water to the top line of the container. Mix to dissolve    Refrigerate  THE DAY OF YOUR PROCEDURE      DATE: 10/10/2009 DAY: MONDAY  Beginning at 10a.m. (5 hours before procedure):         1. Every 15 minutes, drink the solution down to the next mark (approx 8 oz) until the full liter is complete.  2. Follow completed prep with 16 oz. of clear liquid of your choice.    3. You may drink clear liquids until 1PM (2 HOURS BEFORE PROCEDURE).   MEDICATION INSTRUCTIONS  Unless otherwise instructed, you should take regular prescription medications with a small sip of water   as early as possible the morning of your procedure.        OTHER INSTRUCTIONS  You will need a responsible adult at least 63 years of age to accompany you and drive you home.  This person must remain in the waiting room during your procedure.  Wear loose fitting clothing that is easily removed.  Leave jewelry and other valuables at home.  However, you may wish to bring a book to read or  an iPod/MP3 player to listen to music as you wait for your procedure to start.  Remove all body piercing jewelry and leave at home.  Total time from sign-in until discharge is approximately 2-3 hours.  You should go home directly after your procedure and rest.  You can resume normal activities the  day after your procedure.  The day of your procedure you should not:   Drive   Make legal decisions   Operate machinery   Drink alcohol   Return to work  You will receive specific instructions about eating, activities and medications before you leave.    The above instructions have been reviewed and explained to me by   _______________________    I fully understand and can verbalize these instructions  _____________________________ Date _________

## 2010-09-21 NOTE — Assessment & Plan Note (Signed)
Summary: INJURY TO L SIDE/KH   Vital Signs:  Patient Profile:   65 Years Old Female CC:      Left flank pain from fall x 1 week Height:     64.25 inches Weight:      130 pounds O2 Sat:      99 % O2 treatment:    Room Air Temp:     97.5 degrees F oral Pulse rate:   85 / minute Pulse rhythm:   regular Resp:     12 per minute BP sitting:   105 / 66  (right arm) Cuff size:   regular  Vitals Entered By: Emilio Math (November 14, 2009 3:12 PM)                  Current Allergies (reviewed today): ! PCN ! ASA ! AMOXICILLIN ! DEMEROL ! BETADINEHistory of Present Illness Chief Complaint: Left flank pain from fall x 1 week History of Present Illness: Subjective:  Patient was playing with her boyfriend about 1.5 weeks ago when she fell, striking her left anterior/lateral ribs, and has had persistent pain in that area.  The pain is worse with movement and respiration.  No shortness of breath or cough.  She has had ? chills at night past several days.  She has a history of uterine/bladder prolapse scheduled for surgery April 14.  She complains of vague left upper quadrant discomfort.  She has a long history of constipation, with no recent changes in her bowel movements.  No dysuria or frequency.  Current Meds AMITRIPTYLINE HCL 150 MG TABS (AMITRIPTYLINE HCL) Take 1 tablet by mouth at bedtime PHILLIPS LIQUI-GELS 100 MG CAPS (DOCUSATE SODIUM) Take 1 capsule by mouth as needed PEPTO-BISMOL 262 MG TABS (BISMUTH SUBSALICYLATE) Take as needed GAS-X EXTRA STRENGTH 125 MG CAPS (SIMETHICONE) Take as needed ATIVAN 0.5 MG TABS (LORAZEPAM) Take 1 tablet by mouth 1-3 times daily as needed FAMOTIDINE 40 MG TABS (FAMOTIDINE) one tablet by mouth once daily ATIVAN 0.5 MG TABS (LORAZEPAM)   REVIEW OF SYSTEMS Constitutional Symptoms      Denies fever, chills, night sweats, weight loss, weight gain, and fatigue.  Eyes       Denies change in vision, eye pain, eye discharge, glasses, contact lenses, and  eye surgery. Ear/Nose/Throat/Mouth       Denies hearing loss/aids, change in hearing, ear pain, ear discharge, dizziness, frequent runny nose, frequent nose bleeds, sinus problems, sore throat, hoarseness, and tooth pain or bleeding.  Respiratory       Denies dry cough, productive cough, wheezing, shortness of breath, asthma, bronchitis, and emphysema/COPD.  Cardiovascular       Denies murmurs, chest pain, and tires easily with exhertion.    Gastrointestinal       Denies stomach pain, nausea/vomiting, diarrhea, constipation, blood in bowel movements, and indigestion. Genitourniary       Denies painful urination, kidney stones, and loss of urinary control. Neurological       Denies paralysis, seizures, and fainting/blackouts. Musculoskeletal       Complains of muscle pain, joint pain, joint stiffness, and decreased range of motion.      Denies redness, swelling, muscle weakness, and gout.  Skin       Denies bruising, unusual mles/lumps or sores, and hair/skin or nail changes.  Psych       Denies mood changes, temper/anger issues, anxiety/stress, speech problems, depression, and sleep problems.  Past History:  Past Medical History: Reviewed history from 06/04/2008 and no changes required.  Depression, Major recurrent Anxiety GERD IBS Diverticuliis of colon Hemmerhoids Osteoarthritis ? 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 Bunion surgery bilateral - 12/16/2003 Colonoscopy -  diverticuli - 12/16/2003,  Endoscopy - 12/16/2003, 12/2006-mild H. Pylori gastritis esophagial dilation - 12/16/2003,  hemorroid banding - 12/16/2003,  Lipoma removal axillary `79/84 - 12/16/2003 rhinoplasty  Family History: Reviewed history from 10/07/2009 and no changes required. dad with chf, htn, stroke? Mom had COPD. Dad and brother with Gout. Maternal GM w/ CVA Family History of Esophageal Cancer: Cousin No FH of Colon Cancer: Family History of Colon Polyps: Mother, Brother Family History of Stomach Cancer: Maternal Great Grandmother, Uncle??? Family History of Diabetes: Father, Paternal Aunts, Brother Family History of Heart Disease: Father Family History of Clotting disorder: Brother  Social History: Reviewed history from 10/07/2009 and no changes required. 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 Patient has never smoked.  Alcohol Use - no Illicit Drug Use - no   Objective:  No acute distress  Eyes:  Pupils are equal, round, and reactive to light and accomdation.  Extraocular movement is intact.  Conjunctivae are not inflamed.  Pharynx:  Normal  Neck:  Supple.  No adenopathy is present.  No thyromegaly is present  Lungs:  Clear to auscultation.  Breath sounds are equal.  Chest:  Tenderness left anterior/lateral/inferior ribs.  No crepitance Heart:  Regular rate and rhythm without murmurs, rubs, or gallops.  Abdomen:  Vague diffuse mild tenderness without masses or hepatosplenomegaly.  Bowel sounds are present.   Mild left flank tenderness present Extremities:  No edema.   urinalysis (dipstick):  Negative CBC:  WBC 8.1, Hgb 12.1   Assessment New Problems: LUQ PAIN (ICD-789.02) CONTUSION, LEFT RIB (ICD-922.1)  No evidence UTI.  With  history of chronic constipation, will check TSH (chart review reveals no recent TSH).  Plan New Orders: T-DG Ribs Unilateral w/Chest*L* [71101] T-Comprehensive Metabolic Panel [[id number]] T-CBC w/Diff [[id number]] Urinalysis [81003-65000] T-TSH [[id number]] New Patient Level III [32440] Planning Comments:   Dispensed rib belt.  Apply heating pad to ribs 2 or 3 times daily Tylenol for pain CMP pending. Follow-up with PCP   The patient and/or caregiver has been counseled thoroughly with regard to medications prescribed including dosage, schedule, interactions, rationale for use, and possible side effects and they verbalize understanding.  Diagnoses and expected course of recovery discussed and will return if not improved as expected or if the condition worsens. Patient and/or caregiver verbalized understanding.   Laboratory Results   Urine Tests  Date/Time Received: November 14, 2009 4:38 PM  Date/Time Reported: November 14, 2009 4:38 PM   Routine Urinalysis   Color: yellow Appearance: Clear Glucose: negative   (Normal Range: Negative) Bilirubin: negative   (Normal Range: Negative) Ketone: negative   (Normal Range: Negative) Spec. Gravity: 1.010   (Normal Range: 1.003-1.035) Blood: negative   (Normal Range: Negative) pH: 6.0   (Normal Range: 5.0-8.0) Protein: negative   (Normal Range: Negative) Urobilinogen: 0.2   (Normal Range: 0-1) Nitrite:  negative   (Normal Range: Negative) Leukocyte Esterace: negative   (Normal Range: Negative)

## 2010-09-21 NOTE — Progress Notes (Signed)
Summary: constipation   Phone Note Call from Patient Call back at Work Phone [phone]   Caller: Patient Call For: Dr. Arlyce Dice Reason for Call: Talk to Nurse Summary of Call: pt says she had EGD last Monday and has not had a BM since Initial call taken by: Vallarie Mare,  October 17, 2009 4:26 PM  Follow-up for Phone Call        Pt. had an Endo/Colon on 10-10-09, no BM since. Pt. has chronic constipation.  1) Miralax 17gm mixed in 8oz. water or juice-daily.May use BID PRN. 2) Pt. to keep scheduled office visit. 3) Pt. instructed to call back as needed.  Follow-up by: Laureen Ochs LPN,  October 17, 2009 4:40 PM

## 2010-09-21 NOTE — Progress Notes (Signed)
Summary: Triage   Phone Note Call from Patient Call back at Home Phone [phone] Call back at [phone]  (cell)   Caller: Patient Call For: Dr. Arlyce Dice Reason for Call: Talk to Nurse Summary of Call: pt would like to be worked in sooner then current appt date/time... pt complaining of waking up with a lot of acid in her esophagus and not being able to get bladder surgery done until this is taken care of Initial call taken by: Vallarie Mare,  October 07, 2009 11:05 AM  Follow-up for Phone Call        Pt. c/o worsening GERD for 2 weeks. Takes Zantac as needed and was started on Prilosec yesterday. Also c/o severe constipation. She cannot have bladder surgery until her GI issues get addressed.  She will see Dr.Taysean Wager today at 4pm. Follow-up by: Laureen Ochs LPN,  October 07, 2009 11:30 AM

## 2010-09-21 NOTE — Consult Note (Signed)
Summary: [facility]  [facility]   Imported By: Lanelle Bal 03/20/2010 12:41:34  _____________________________________________________________________  External Attachment:    Type:   Image     Comment:   External Document

## 2010-09-21 NOTE — Assessment & Plan Note (Signed)
Summary: NOV TSH f/u   Vital Signs:  Patient profile:   65 year old female Height:      64.25 inches Weight:      133 pounds BMI:     22.73 O2 Sat:      98 % on Room air Temp:     98.3 degrees F oral Pulse rate:   90 / minute BP sitting:   114 / 68  (left arm) Cuff size:   regular  Vitals Entered By: Payton Spark CMA (November 17, 2009 2:07 PM)  O2 Flow:  Room air CC: New to est. F/U labs from UC.    Primary Care Provider:  Seymour Bars DO  CC:  New to est. F/U labs from UC. Marland Kitchen  History of Present Illness: 65 yo WF presents for NOV.  She was previous seen at [facility].  She has hx of H. Pylori.  She is due for fasting labs.  She c/o feeling tired, having chronic constipation and hair loss.  She had CMP and TSH done by UC last wk and her TSH was > 6.  She has never had hypothyroidism and denies a fam hx of this.    She sees Dr Arlyce Dice for chronic GI problems.  She r/o'd for rib fx at UC with normal Xrays.  Her rib pain has improved.  She has hx of depression and anxiety -- follows with psych in [place].    Current Medications (verified): 1)  Amitriptyline Hcl 150 Mg Tabs (Amitriptyline Hcl) .... Take 1 Tablet By Mouth At Bedtime 2)  Phillips Liqui-Gels 100 Mg Caps (Docusate Sodium) .... Take 1 Capsule By Mouth As Needed 3)  Pepto-Bismol 262 Mg Tabs (Bismuth Subsalicylate) .... Take As Needed 4)  Gas-X Extra Strength 125 Mg Caps (Simethicone) .... Take As Needed 5)  Ativan 0.5 Mg Tabs (Lorazepam) .... Take 1 Tablet By Mouth 1-3 Times Daily As Needed 6)  Famotidine 40 Mg Tabs (Famotidine) .... One Tablet By Mouth Once Daily 7)  Ativan 0.5 Mg Tabs (Lorazepam) .... Take 1 Tab By Mouth Three Times A Day  As Needed Anxiety  Allergies (verified): 1)  ! Pcn 2)  ! Asa 3)  ! Amoxicillin 4)  ! Demerol 5)  ! Betadine  Past History:  Past Medical History: Depression, Major recurrent--  [facility] in [place] Anxiety GERD IBS Diverticuliis of colon Hemmerhoids Osteoarthritis ? 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 Bunion surgery bilateral - 12/16/2003 Colonoscopy -  diverticuli - 12/16/2003,  Endoscopy - 12/16/2003, 12/2006-mild H. Pylori gastritis esophagial dilation - 12/16/2003,  hemorroid banding - 12/16/2003,  Lipoma removal axillary `79/84 - 12/16/2003 rhinoplasty  Family History: Reviewed history from 10/07/2009 and no changes required. dad with chf, htn, stroke? Mom had COPD. Dad and brother with Gout. Maternal GM w/ CVA Family History of Esophageal Cancer: Cousin No FH of Colon Cancer: Family History of Colon Polyps: Mother, Brother Family History of Stomach Cancer: Maternal Great Grandmother, Uncle??? Family History of Diabetes: Father, Paternal Aunts, Brother Family History of Heart Disease: Father  Family History of Clotting disorder: Brother  Social History: Reviewed history from 10/07/2009 and no changes required. 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 Patient has never smoked.  Alcohol Use - no Illicit Drug Use - no  Review of Systems       no fevers/sweats/weakness, unexplained wt loss/gain, no change in vision, no difficulty hearing, ringing in ears, no hay fever/allergies, no CP/discomfort, no palpitations,  no breast lump/nipple discharge, no cough/wheeze, no blood in stool, N/V/D, no nocturia, no leaking urine, no unusual vag bleeding, no vaginal/penile discharge, no muscle/joint pain, no rash, no new/changing mole, no HA, no memory loss, no anxiety, no sleep problem, no depression, no unexplained lumps, no easy bruising/bleeding, no concern with sexual function   Physical Exam  General:  alert, well-developed, well-nourished, and well-hydrated.   Head:  normocephalic and atraumatic.   Eyes:  slightly sunken orbits with loss of temporal fat Ears:  no external deformities.   Nose:  no nasal discharge.   Mouth:  pharynx pink and moist and fair dentition.   Neck:  no masses.   Lungs:  Normal respiratory effort, chest expands symmetrically. Lungs are clear to auscultation, no crackles or wheezes. Heart:  Normal rate and regular rhythm. S1 and S2 normal without gallop, murmur, click, rub or other extra sounds. Abdomen:  Bowel sounds positive,abdomen soft and non-tender without masses, organomegaly  Extremities:  no LE edema Skin:  color normal and no rashes.   Psych:  flat affect.     Impression & Recommendations:  Problem # 1:  THYROID STIMULATING HORMONE, ABNORMAL (ICD-246.9) Added Free T3 and Free T4 on today.  F/U results tomorrow.  Will add thyroid meds if needed. She has many symptoms of hypothyroidism.    Problem # 2:  DEPRESSION, MAJOR, RECURRENT (ICD-296.30) Copy of labs given to pt to bring her her psychiatrist.  Has f/u in [place].  Stable on current meds.  Problem # 3:  IRRITABLE BOWEL SYNDROME (ICD-564.1) Sees Dr Arlyce Dice.  Complete Medication List: 1)  Amitriptyline Hcl 150 Mg Tabs (Amitriptyline hcl) .... Take 1 tablet by mouth at bedtime 2)  Phillips Liqui-gels 100 Mg Caps (Docusate sodium) .... Take 1 capsule by mouth as needed 3)  Pepto-bismol 262 Mg Tabs (Bismuth subsalicylate) .... Take as needed 4)  Gas-x Extra Strength 125 Mg Caps (Simethicone) .... Take as needed 5)   Ativan 0.5 Mg Tabs (Lorazepam) .... Take 1 tablet by mouth 1-3 times daily as needed 6)  Famotidine 40 Mg Tabs (Famotidine) .... One tablet by mouth once daily 7)  Ativan 0.5 Mg Tabs (Lorazepam) .... Take 1 tab by mouth three times a day  as needed anxiety  Other Orders: T-Glucose, Blood [phone]) T-Lipid Profile [phone]) T-CBC w/Diff [phone]) T-Vitamin D (25-Hydroxy) ([id number])  Patient Instructions: 1)  Free T3 and Free T4 added to your labs.  Will call you w/ these results tomorrow. 2)  Update fasting glucose with fasting cholesterol.   3)  Will call you w/ results. 4)  F/U with psychiatrist. 5)  Return for f/u in 3 mos.

## 2010-09-21 NOTE — Progress Notes (Signed)
Summary: referral request from Patient  Phone Note Call from Patient Call back at Home Phone [phone]   Caller: Patient Summary of Call: Pt called and feels as if she needs a referral to [facility]ne[facility]ology, because of her histerectomy and bladder prolapse trouble. Their phone number is [phone].... If you have any questions please call pt.Michaelle Copas  July 25, 2010 4:38 PM  Initial call taken by: Michaelle Copas,  July 25, 2010 4:38 PM  Follow-up for Phone Call        since she has a gynecologist (Dr Marice Potter), this will need to come from her. Follow-up by: Seymour Bars DO,  July 25, 2010 4:49 PM

## 2010-09-21 NOTE — Assessment & Plan Note (Signed)
Summary: F/U FROM ENDO/COLON                [facility]    History of Present Illness Visit Type: Follow-up Visit Primary GI MD: Melvia Heaps MD [facility] Primary Provider: n/a Requesting Provider: n/a Chief Complaint: F/u from EGD and Colon. Pt states that she still has acid reflux and constipation History of Present Illness:   Shelley [name] is returned for followup of her reflux and constipation.  Upper endoscopy was normal.  She took Nexium but developed GI upset and switch back to famotidine.  She still complains of frequent breakthrough pyrosis.  Colonoscopy demonstrated diverticulosis.  She went 9 days following colonoscopy without a bowel movement.  With constipation she may have lower bowel discomfort.   GI Review of Systems    Reports acid reflux.      Denies abdominal pain, belching, bloating, chest pain, dysphagia with liquids, dysphagia with solids, heartburn, loss of appetite, nausea, vomiting, vomiting blood, weight loss, and  weight gain.      Reports constipation.     Denies anal fissure, black tarry stools, change in bowel habit, diarrhea, diverticulosis, fecal incontinence, heme positive stool, hemorrhoids, irritable bowel syndrome, jaundice, light color stool, liver problems, rectal bleeding, and  rectal pain.    Current Medications (verified): 1)  Amitriptyline Hcl 150 Mg Tabs (Amitriptyline Hcl) .... Take 1 Tablet By Mouth At Bedtime 2)  Phillips Liqui-Gels 100 Mg Caps (Docusate Sodium) .... Take 1 Capsule By Mouth As Needed 3)  Pepto-Bismol 262 Mg Tabs (Bismuth Subsalicylate) .... Take As Needed 4)  Gas-X Extra Strength 125 Mg Caps (Simethicone) .... Take As Needed 5)  Ativan 0.5 Mg Tabs (Lorazepam) .... Take 1 Tablet By Mouth 1-3 Times Daily As Needed 6)  Famotidine 40 Mg Tabs (Famotidine) .... One Tablet By Mouth Once Daily  Allergies (verified): 1)  ! Pcn 2)  ! Asa 3)  ! Amoxicillin 4)  ! Demerol 5)  ! Betadine  Past History:  Past Medical History: Reviewed  history from 06/04/2008 and no changes required. Depression, Major recurrent Anxiety GERD IBS Diverticuliis of colon Hemmerhoids Osteoarthritis ? 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 Bunion surgery bilateral - 12/16/2003 Colonoscopy -  diverticuli - 12/16/2003,  Endoscopy - 12/16/2003, 12/2006-mild H. Pylori gastritis esophagial dilation - 12/16/2003,  hemorroid banding - 12/16/2003,  Lipoma removal axillary `79/84 - 12/16/2003 rhinoplasty  Family History: Reviewed history from 10/07/2009 and no changes required. dad with chf, htn, stroke? Mom had COPD. Dad and brother with Gout. Maternal GM w/ CVA Family History of Esophageal Cancer: Cousin No FH of Colon Cancer: Family History of Colon Polyps: Mother, Brother Family History of Stomach Cancer: Maternal Great Grandmother, Uncle??? Family History of Diabetes: Father, Paternal Aunts, Brother Family History of Heart Disease: Father Family History of Clotting disorder: Brother  Social History: Reviewed history from 10/07/2009 and no changes required. 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 Patient has never smoked.  Alcohol Use - no Illicit Drug Use -  no  Review of Systems       The patient  complains of anxiety-new, back pain, and urination - excessive.  The patient denies allergy/sinus, anemia, arthritis/joint pain, blood in urine, breast changes/lumps, change in vision, confusion, cough, coughing up blood, depression-new, fainting, fatigue, fever, headaches-new, hearing problems, heart murmur, heart rhythm changes, itching, menstrual pain, muscle pains/cramps, night sweats, nosebleeds, pregnancy symptoms, shortness of breath, skin rash, sleeping problems, sore throat, swelling of feet/legs, swollen lymph glands, thirst - excessive , urination - excessive , urination changes/pain, urine leakage, vision changes, and voice change.    Vital Signs:  Patient profile:   65 year old female Height:      64.25 inches Weight:      132 pounds BMI:     22.56 BSA:     1.65 Pulse rate:   88 / minute Pulse rhythm:   regular BP sitting:   106 / 64  (left arm) Cuff size:   regular  Vitals Entered By: Ok Anis CMA (October 28, 2009 2:44 PM)   Impression & Recommendations:  Problem # 1:  GASTROESOPHAGEAL REFLUX, NO ESOPHAGITIS (ICD-530.81) Assessment Unchanged I. given the patient samples of Nexium and AcipHex.  She also claims to have used were Maisie Fus and passed with improvement.  She will decide which PPI tissues.  Problem # 2:  CHANGE IN BOWELS (ZOX-096.04) She is suffered from functional constipation.  Recommendations #1 fiber supplementation daily #2 patient was instructed to use MiraLax every 3-4 days as necessary

## 2010-09-21 NOTE — Assessment & Plan Note (Signed)
Summary: Followup call  Followup call to patient about urine culture results:  left message Donna Christen MD  July 05, 2010 11:07 AM   Patient called back.  She reports that she continues to have urine symptoms.  She states that she has tolerated Cipro in the past, but has GI upset with Septra. Will treat with a lower dose of Cipro.  Stop Macrobid.  Follow-up with urologist. Donna Christen MD  July 05, 2010 4:44 PM    Prescriptions: CIPROFLOXACIN HCL 250 MG TABS (CIPROFLOXACIN HCL) 1 po two times a day  #20 x 0   Entered and Authorized by:   Donna Christen MD   Signed by:   Donna Christen MD on 07/05/2010   Method used:   Electronically to        [facility]  [place] [address] (retail)       [address]       [place], [place]  [address]       Ph: [phone] or [phone]       Fax: [phone]   RxID:   [id number]

## 2010-09-21 NOTE — Letter (Signed)
Summary: New Patient letter  [facility]  [address] [place], [place] [address]   Phone: [phone]  Fax: [phone]       10/06/2009 MRN: [id number]  Shelley [name] [address] [place], [place]  [address]  Dear Shelley [name],  Welcome to the Gastroenterology Division at [facility].    You are scheduled to see Dr.  Arlyce Dice   on  11-07-09 at 2pm on the 3rd floor at [facility], [address]  We ask that you try to arrive at our office 15 minutes prior to your appointment time to allow for check-in.  We would like you to complete the enclosed self-administered evaluation form prior to your visit and bring it with you on the day of your appointment.  We will review it with you.  Also, please bring a complete list of all your medications or, if you prefer, bring the medication bottles and we will list them.  Please bring your insurance card so that we may make a copy of it.  If your insurance requires a referral to see a specialist, please bring your referral form from your primary care physician.  Co-payments are due at the time of your visit and may be paid by cash, check or credit card.     Your office visit will consist of a consult with your physician (includes a physical exam), any laboratory testing he/she may order, scheduling of any necessary diagnostic testing (e.g. x-ray, ultrasound, CT-scan), and scheduling of a procedure (e.g. Endoscopy, Colonoscopy) if required.  Please allow enough time on your schedule to allow for any/all of these possibilities.    If you cannot keep your appointment, please call [phone] to cancel or reschedule prior to your appointment date.  This allows [place] the opportunity to schedule an appointment for another patient in need of care.  If you do not cancel or reschedule by 5 p.m. the business day prior to your appointment date, you will be charged a $50.00 late cancellation/no-show fee.    Thank you for choosing  [facility] for your medical needs.  We appreciate the opportunity to care for you.  Please visit [place] at our website  to learn more about our practice.                     Sincerely,                                                             The Gastroenterology Division

## 2010-09-21 NOTE — Procedures (Signed)
Summary: EGD/[facility]  EGD/[facility]   Imported By: Sherian Rein 10/11/2009 09:46:12  _____________________________________________________________________  External Attachment:    Type:   Image     Comment:   External Document

## 2010-09-21 NOTE — Progress Notes (Signed)
Summary: triage   Phone Note Call from Patient Call back at Home Phone [phone]   Caller: Patient Call For: Dr. Arlyce Dice Reason for Call: Talk to Nurse Summary of Call: per pt, she was instructed at [facility] to sch a 2-3 wk f/u... nothing available until end of March Initial call taken by: Vallarie Mare,  October 12, 2009 4:35 PM  Follow-up for Phone Call        Pt. will see Dr.Ever Halberg on 10-28-09 at 2pm. Pt. instructed to call back as needed.  Follow-up by: Laureen Ochs LPN,  October 12, 2009 4:39 PM

## 2010-09-21 NOTE — Procedures (Signed)
Summary: Upper Endoscopy  Patient: Shelley [name] Note: All result statuses are Final unless otherwise noted.  Tests: (1) Upper Endoscopy (EGD)   EGD Upper Endoscopy       DONE     [facility]     [address]     [place], [place]  [address]           ENDOSCOPY PROCEDURE REPORT           PATIENT:  Shelley [name], Shelley [name]  MR#:  [id number]     BIRTHDATE:  [birth year], 63 yrs. old  GENDER:  female           ENDOSCOPIST:  Barbette Hair. Arlyce Dice, MD     Referred by:           PROCEDURE DATE:  10/10/2009     PROCEDURE:  EGD, diagnostic     ASA CLASS:  Class II     INDICATIONS:  GERD           MEDICATIONS:   There was residual sedation effect present from     prior procedure., Fentanyl 25 mcg IV, Benadryl 25 mg IV,     glycopyrrolate (Robinal) 0.2 mg IV, 0.6cc simethancone 0.6 cc PO     TOPICAL ANESTHETIC:  Exactacain Spray           DESCRIPTION OF PROCEDURE:   After the risks benefits and     alternatives of the procedure were thoroughly explained, informed     consent was obtained.  The LB GIF-H180 [id number] endoscope was     introduced through the mouth and advanced to the third portion of     the duodenum, without limitations.  The instrument was slowly     withdrawn as the mucosa was fully examined.     <<PROCEDUREIMAGES>>           The upper, middle, and distal third of the esophagus were     carefully inspected and no abnormalities were noted. The z-line     was well seen at the GEJ. The endoscope was pushed into the fundus     which was normal including a retroflexed view. The antrum,gastric     body, first and second part of the duodenum were unremarkable (see     image1, image2, image4, image5, image8, image9, and image10).     Retroflexed views revealed no abnormalities.    The scope was then     withdrawn from the patient and the procedure completed.           COMPLICATIONS:  None           ENDOSCOPIC IMPRESSION:           RECOMMENDATIONS:     1) continue PPI -  nexium     2) Call office next 2-3 days to schedule an office appointment     for 2-3 weeks           REPEAT EXAM:  No           ______________________________     Barbette Hair. Arlyce Dice, MD           CC:           n.     eSIGNED:   Barbette Hair. Kaplan at 10/10/2009 03:52 PM           Theophilus Kinds, [phone]  Note: An exclamation mark (!) indicates a result that was not dispersed into the flowsheet. Document Creation Date: 10/10/2009 3:53 PM _______________________________________________________________________  Marland Kitchen  1) Order result status: Final Collection or observation date-time: 10/10/2009 15:47 Requested date-time:  Receipt date-time:  Reported date-time:  Referring Physician:   Ordering Physician: Melvia Heaps [id number]) Specimen Source:  Source: Launa Grill Order Number: [id number] Lab site:

## 2010-09-21 NOTE — Procedures (Signed)
Summary: EGD/[facility]  EGD/[facility]   Imported By: Sherian Rein 10/11/2009 09:45:07  _____________________________________________________________________  External Attachment:    Type:   Image     Comment:   External Document

## 2010-09-21 NOTE — Assessment & Plan Note (Signed)
Summary: WORSENING GERD, SEVERE CONSTIPATION     Shelley [name]    History of Present Illness Visit Type: Initial Consult Primary GI MD: Melvia Heaps MD [facility] Primary Provider: n/a Chief Complaint: Patient c/o 2 days chest pain as well as increased belching and bloating. She also has some nausea but denies any vomiting. She has severe constipation as well and states that she only has 1-2 bm's daily. History of Present Illness:   Ms. Shelley [name] is a 65 year old white female here for evaluation of constipation and reflux.  Constipation has been a severe problem over the past few months.  She may go days without a bowel movement.  This could be followed by several loose stools when she takes a bowel stimulant.  There is no history melena or hematochezia.   She is scheduled for a bladder repair and was requested to be evaluated and treated prior to her surgery.  Colonoscopy in 2005 demonstrated left sided diverticulosis.  [name] also complains of severe pyrosis with belching and regurgitation of gastric contents.  She takes famotidine.  She denies dysphagia, per se.  She has a history of an esophageal stricture.  She was last dilated in 2007.   GI Review of Systems    Reports abdominal pain, acid reflux, belching, bloating, chest pain, and  nausea.     Location of  Abdominal pain: upper abdomen.    Denies dysphagia with liquids, dysphagia with solids, heartburn, loss of appetite, vomiting, vomiting blood, weight loss, and  weight gain.      Reports change in bowel habits, constipation, diverticulosis, and  irritable bowel syndrome.     Denies anal fissure, black tarry stools, diarrhea, fecal incontinence, heme positive stool, hemorrhoids, jaundice, light color stool, liver problems, rectal bleeding, and  rectal pain. Preventive Screening-Counseling & Management  Alcohol-Tobacco     Smoking Status: never      Drug Use:  no.      Current Medications (verified): 1)  Amitriptyline Hcl 150 Mg Tabs  (Amitriptyline Hcl) .... Take 1 Tablet By Mouth At Bedtime 2)  Famotidine 40 Mg  Tabs (Famotidine) .Marland Kitchen.. 1 Tab By Mouth At Bedtime 3)  Phillips Liqui-Gels 100 Mg Caps (Docusate Sodium) .... Take 1 Capsule By Mouth As Needed 4)  Pepto-Bismol 262 Mg Tabs (Bismuth Subsalicylate) .... Take As Needed 5)  Gas-X Extra Strength 125 Mg Caps (Simethicone) .... Take As Needed 6)  Ativan 0.5 Mg Tabs (Lorazepam) .... Take 1 Tablet By Mouth 1-3 Times Daily As Needed  Allergies (verified): 1)  ! Pcn 2)  ! Asa 3)  ! Amoxicillin 4)  ! Demerol 5)  ! Betadine  Past History:  Past Medical History: Reviewed history from 06/04/2008 and no changes required. Depression, Major recurrent Anxiety GERD IBS Diverticuliis of colon Hemmerhoids Osteoarthritis ? 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) VAGINAL DISCHARGE (ICD-623.5) DYSURIA (ICD-788.1) BLADDER PROLAPSE (ICD-596.9) SCREENING FOR MALIGNANT NEOPLASM, CERVIX (ICD-V76.2) SYMPTOM, ENLARGEMENT, LYMPH NODES (ICD-785.6) HEALTH SCREENING (ICD-V70.0) GASTRITIS (ICD-535.50) OSTEOARTHROSIS NOS, HAND (ICD-715.94) IRRITABLE BOWEL SYNDROME (ICD-564.1) HEMORRHOIDS, NOS (ICD-455.6) GASTROESOPHAGEAL REFLUX, NO ESOPHAGITIS (ICD-530.81) DIVERTICULITIS OF COLON, NOS (ICD-562.11) DEPRESSION, MAJOR, RECURRENT (ICD-296.30) ANXIETY (ICD-300.00)    Past Surgical History: Bunion surgery bilateral - 12/16/2003 Colonoscopy -  diverticuli - 12/16/2003,  Endoscopy - 12/16/2003, 12/2006-mild H. Pylori gastritis esophagial dilation - 12/16/2003,  hemorroid banding - 12/16/2003,  Lipoma removal axillary `79/84 - 12/16/2003 rhinoplasty  Family History: dad with chf, htn, stroke? Mom  had COPD. Dad and brother with Gout. Maternal GM w/ CVA Family History of Esophageal Cancer: Cousin No FH of Colon Cancer: Family History of Colon Polyps: Mother, Brother Family History of  Stomach Cancer: Maternal Great Grandmother, Uncle??? 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  on disability;  going through a very compicated difficult divorce. Patient has never smoked.  Alcohol Use - no Illicit Drug Use - no Drug Use:  no  Review of Systems       The patient complains of arthritis/joint pain, back pain, muscle pains/cramps, and urination changes/pain.  The patient denies allergy/sinus, anemia, anxiety-new, blood in urine, breast changes/lumps, confusion, cough, coughing up blood, depression-new, fainting, fatigue, fever, headaches-new, hearing problems, heart murmur, heart rhythm changes, itching, menstrual pain, night sweats, nosebleeds, pregnancy symptoms, shortness of breath, skin rash, sleeping problems, sore throat, swelling of feet/legs, swollen lymph glands, thirst - excessive , urination - excessive , urine leakage, vision changes, and voice change.         All other systems were reviewed and were negative   Vital Signs:  Patient profile:   65 year old female Height:      64.25 inches Weight:      132 pounds BMI:     22.56 BSA:     1.65 Pulse rate:   88 / minute Pulse rhythm:   regular BP sitting:   92 / 62  (right arm)  Vitals Entered By: Hortense Ramal CMA Duncan Dull) (October 07, 2009 4:18 PM)  Physical Exam  Additional Exam:  She is a well-developed well-nourished female  skin: anicteric HEENT: normocephalic; PEERLA; no nasal or pharyngeal abnormalities neck: supple nodes: no cervical lymphadenopathy chest: clear to ausculatation and percussion heart: no murmurs, gallops, or rubs abd: soft, nontender; BS normoactive; no abdominal masses, tenderness, organomegaly rectal: deferred ext: no cynanosis, clubbing, edema skeletal: no deformities neuro: oriented x 3; no focal  abnormalities    Impression & Recommendations:  Problem # 1:  CHANGE IN BOWELS (ICD-787.99) This is probably a variant of her IBS.  A structural abnormality in the colon including neoplasm is less likely.  She could have progression of her diverticular disease.  Recommendations #1 colonoscopy #2 I will work on a  bowel regimen with her provided that no specific active abnormalities  are seen at colonoscopy  Risks, alternatives, and complications of the procedure, including bleeding, perforation, and possible need for surgery, were explained to the patient.  Patient's questions were answered.  Problem # 2:  GASTROESOPHAGEAL REFLUX, NO ESOPHAGITIS (ICD-530.81) Assessment: Deteriorated Patient has symptoms despite therapy with Pepcid.  She has chest discomfort with swallowing which raises the question of a recurrent early esophageal stricture.  Recommendations #1 upper endoscopy with possible dilatation #2 begin Nexium 40 mg daily and discontinue famotidine Orders: Colon/Endo (Colon/Endo)  Patient Instructions: 1)  Your Colon/Endo is scheduled for 10/10/2009 2)  Your MoviPrep will be sent to your pharmacy today 3)  The medication list was reviewed and reconciled.  All changed / newly prescribed medications were explained.  A complete medication list was provided to the patient / caregiver. Prescriptions: MOVIPREP 100 GM  SOLR (PEG-KCL-NACL-NASULF-NA ASC-C) As per prep instructions.  #1 x 0   Entered by:   Merri Ray CMA (AAMA)   Authorized by:   Louis Meckel MD   Signed by:   Merri Ray CMA (AAMA) on 10/07/2009  Method used:   Electronically to        [facility]  [place] [address] (retail)       [address]       [place], [place]  [address]       Ph: [phone] or [phone]       Fax: [phone]   RxID:   [id number] NEXIUM 40 MG  CPDR (ESOMEPRAZOLE MAGNESIUM) 1 capsule each day 30 minutes before meal  #30 x 2   Entered and Authorized by:   Louis Meckel MD    Signed by:   Merri Ray CMA (AAMA) on 10/07/2009   Method used:   [facility]        [facility]  [place] [address] (retail[facility]       [address]       [place], [place]  [address]       Ph: [phone] or [phone]       Fax: [phone]   RxID:   [id number]

## 2010-10-09 ENCOUNTER — Observation Stay (HOSPITAL_COMMUNITY)
Admission: EM | Admit: 2010-10-09 | Discharge: 2010-10-12 | Disposition: A | Discharge status: Home / Self Care | Payer: Medicaid Other | Attending: Family Medicine | Admitting: Family Medicine

## 2010-10-09 DIAGNOSIS — R112 Nausea with vomiting, unspecified: Principal | ICD-10-CM | POA: Insufficient documentation

## 2010-10-09 DIAGNOSIS — R1033 Periumbilical pain: Secondary | ICD-10-CM | POA: Insufficient documentation

## 2010-10-09 DIAGNOSIS — K5900 Constipation, unspecified: Secondary | ICD-10-CM | POA: Insufficient documentation

## 2010-10-09 DIAGNOSIS — K219 Gastro-esophageal reflux disease without esophagitis: Secondary | ICD-10-CM | POA: Insufficient documentation

## 2010-10-09 DIAGNOSIS — F411 Generalized anxiety disorder: Secondary | ICD-10-CM | POA: Insufficient documentation

## 2010-10-09 DIAGNOSIS — R300 Dysuria: Secondary | ICD-10-CM | POA: Insufficient documentation

## 2010-10-09 DIAGNOSIS — E876 Hypokalemia: Secondary | ICD-10-CM | POA: Insufficient documentation

## 2010-10-09 DIAGNOSIS — D649 Anemia, unspecified: Secondary | ICD-10-CM | POA: Insufficient documentation

## 2010-10-09 DIAGNOSIS — N390 Urinary tract infection, site not specified: Secondary | ICD-10-CM | POA: Insufficient documentation

## 2010-10-09 LAB — COMPREHENSIVE METABOLIC PANEL
ALT: 13 U/L (ref 0–35)
Albumin: 2.8 g/dL — ABNORMAL LOW (ref 3.5–5.2)
Alkaline Phosphatase: 52 U/L (ref 39–117)
Chloride: 108 mEq/L (ref 96–112)
Glucose, Bld: 123 mg/dL — ABNORMAL HIGH (ref 70–99)
Potassium: 3.8 mEq/L (ref 3.5–5.1)
Sodium: 139 mEq/L (ref 135–145)
Total Bilirubin: 0.3 mg/dL (ref 0.3–1.2)
Total Protein: 5.8 g/dL — ABNORMAL LOW (ref 6.0–8.3)

## 2010-10-09 LAB — CBC
HCT: 36.4 % (ref 36.0–46.0)
Hemoglobin: 12.4 g/dL (ref 12.0–15.0)
MCH: 31.5 pg (ref 26.0–34.0)
MCHC: 34.1 g/dL (ref 30.0–36.0)
MCV: 92.4 fL (ref 78.0–100.0)
RDW: 12.8 % (ref 11.5–15.5)

## 2010-10-09 LAB — URINE MICROSCOPIC-ADD ON

## 2010-10-09 LAB — POCT I-STAT, CHEM 8
BUN: 19 mg/dL (ref 6–23)
Chloride: 106 mEq/L (ref 96–112)
Creatinine, Ser: 0.9 mg/dL (ref 0.4–1.2)
Glucose, Bld: 126 mg/dL — ABNORMAL HIGH (ref 70–99)
Hemoglobin: 12.9 g/dL (ref 12.0–15.0)
Potassium: 4.1 mEq/L (ref 3.5–5.1)

## 2010-10-09 LAB — URINALYSIS, ROUTINE W REFLEX MICROSCOPIC
Bilirubin Urine: NEGATIVE
Protein, ur: NEGATIVE mg/dL
Urine Glucose, Fasting: NEGATIVE mg/dL
Urobilinogen, UA: 1 mg/dL (ref 0.0–1.0)

## 2010-10-09 LAB — DIFFERENTIAL
Eosinophils Relative: 1 % (ref 0–5)
Lymphocytes Relative: 5 % — ABNORMAL LOW (ref 12–46)
Lymphs Abs: 0.5 10*3/uL — ABNORMAL LOW (ref 0.7–4.0)
Monocytes Absolute: 0.5 10*3/uL (ref 0.1–1.0)
Monocytes Relative: 5 % (ref 3–12)
Neutro Abs: 7.6 10*3/uL (ref 1.7–7.7)

## 2010-10-09 LAB — RAPID URINE DRUG SCREEN, HOSP PERFORMED
Amphetamines: NOT DETECTED
Benzodiazepines: POSITIVE — AB

## 2010-10-10 DIAGNOSIS — R112 Nausea with vomiting, unspecified: Secondary | ICD-10-CM

## 2010-10-10 LAB — URINE CULTURE: Culture  Setup Time: 201202210148

## 2010-10-11 DIAGNOSIS — R112 Nausea with vomiting, unspecified: Secondary | ICD-10-CM

## 2010-10-11 LAB — BASIC METABOLIC PANEL
BUN: 6 mg/dL (ref 6–23)
CO2: 27 mEq/L (ref 19–32)
Calcium: 7.8 mg/dL — ABNORMAL LOW (ref 8.4–10.5)
Creatinine, Ser: 0.81 mg/dL (ref 0.4–1.2)
Glucose, Bld: 99 mg/dL (ref 70–99)

## 2010-10-11 LAB — CBC
HCT: 30.2 % — ABNORMAL LOW (ref 36.0–46.0)
Hemoglobin: 9.9 g/dL — ABNORMAL LOW (ref 12.0–15.0)
MCH: 30.2 pg (ref 26.0–34.0)
MCHC: 32.8 g/dL (ref 30.0–36.0)
MCV: 92.1 fL (ref 78.0–100.0)

## 2010-10-11 NOTE — H&P (Signed)
NAME:  Shelley [name], Shelley [name]              ACCOUNT NO.:  [id number]  MEDICAL RECORD NO.:  [id number]           PATIENT TYPE:  E  LOCATION:  [facility]                         FACILITY:  [facility]  PHYSICIAN:  Mariea Stable, MD   DATE OF BIRTH:  1945/10/27  DATE OF ADMISSION:  10/09/2010 DATE OF DISCHARGE:                             HISTORY & PHYSICAL   PRIMARY CARE PROVIDER:  Dr. Cathey Endow in [place], the patient is changing PCP currently.  CHIEF COMPLAINT:  Nausea, vomiting.  HISTORY OF PRESENT ILLNESS:  Ms. Shelley [name] is a 65 year old woman with past medical history significant for symptomatic cystocele with urinary incontinence and uterine prolapse who underwent a total vaginal hysterectomy with bilateral salpingo-oophorectomy, anterior colporrhaphy and pubovaginal sling on Dec 27, 2009.  The patient reports that since that time, she has had ongoing issues with a urinary tract infections requiring repeated antibiotics.  Subsequently, few months after her surgery, she started having difficulty voiding to the point where she was requiring in and out caths.  Due to this, the patient reports having undergone surgery on Friday (3 days prior to admission to help correct this need for in-and-out caths with urinary retention.  She states that she had called her doctor's office on Thursday prior to surgery complaining of a urinary tract infection symptoms.  At that time, she was prescribed nitrofurantoin 100 mg p.o. b.i.d., although she did not start that until Saturday after her surgery when she feels a prescription.  Of note, the doctor's office apparently called her yesterday saying that the bacteria was resistant to the antibiotic and had called in a prescription for Bactrim instead.  However, the patient has had increasing nausea and vomiting since Sunday and unable to tolerate or keep anything oral due to the nausea and vomiting.  Because of this, she has come to the emergency department for  further evaluation.  PAST MEDICAL HISTORY:  History of mixed incontinence with a third-degree cystocele, second-degree uterine prolapse, irritable bowel syndrome, diverticulosis, chronic constipation, endometriosis, recurrent UTIs, and reported urinary retention requiring in and out caths.  MEDICATIONS: 1. Elavil 150 mg p.o. at bedtime. 2. Ativan 0.5 mg daily 2 t.i.d. p.r.n. anxiety. 3. Famotidine 40 mg p.o. at bedtime. 4. Colace p.r.n. constipation. 5. MiraLax p.r.n. constipation.  SOCIAL HISTORY:  The patient is divorced and has 2 children.  She denies any tobacco, alcohol or drug use.  She is currently engaged.  FAMILY HISTORY:  Father died at age 33 from complications of CHF. Mother died at age 62 secondary to COPD/emphysema.  There is also some diabetes in the family.  REVIEW OF SYSTEMS:  As per HPI, all others reviewed and negative.  PHYSICAL EXAMINATION:  VITAL SIGNS:  Temperature 98.0, blood pressure 116/67, heart rate 109, respirations 18, oxygen saturation 98% on room air. GENERAL:  This is an older woman lying in bed, in no acute distress. HEENT:  Head is normocephalic, atraumatic.  Pupils equally round and reactive to light and accommodation.  Extraocular movements are intact. Sclerae anicteric.  Mucous membranes were slightly dry.  There is no oropharyngeal lesions. NECK:  Supple.  There is no carotid bruits or  JVD. LUNGS:  Good air movement, are clear to auscultation bilaterally. HEART:  There is a normal S1 and S2 with a regular rate and rhythm.  No murmurs, gallops or rubs. ABDOMEN:  Positive bowel sounds, soft, nontender, nondistended with no guarding or rebound. EXTREMITIES:  No edema. NEUROLOGIC:  She is alert, oriented x3 and nonfocal.  LABORATORY DATA:  Sodium of 141, potassium 4.1, chloride 106, bicarb of 25, BUN 19, creatinine 0.9, glucose 106.  WBC 8.6, hemoglobin 12.4, platelets 265.  Urinalysis shows 15 ketones, moderate blood, and small leukocyte  esterase but microscopy only has 0-2 wbcs, 7-10 rbcs with rare bacteria and many squamous epithelial cells.  ASSESSMENT AND PLAN: 1. Nausea, vomiting.  This is of unclear etiology and probably     multifactorial.  The patient does have a history of irritable bowel     syndrome making this a possible etiology.  Furthermore, she reports     having a history of Helicobacter pylori that was never successfully     treated because she could not tolerate the oral antibiotic regimen     secondary to nausea and vomiting.  She also takes famotidine for     presumed gastritis, which may be a contributing etiology.     Furthermore, the antibiotic is another possible etiology of side     effect.  Currently, there were no grossly abnormal finding such as     fever or leukocytosis to suggest a more severe infection, whether     it be from the urine or from this recent surgery on Friday.     Furthermore, her electrolyte status and acid base status are rather     unremarkable.  At this point, we will admit for observation and     will hydrate given her initial tachycardia on presentation.  We     will treat this presume the urinary tract infection mentioned in     the history of present illness with Rocephin IV given her     intolerance to oral intake.  However, I do not think that she has     much of a urinary tract infection right now with 0-2 wbcs noted on     her urinalysis.  We will also check a complete metabolic panel to     assess her liver function, although on exam.  There is no     tenderness in the right upper quadrant and as mentioned there is no     fever or leukocytosis to suggest things such as cholecystitis.     Furthermore, we will also check a lipase to rule out pancreatitis,     although clinically the patient does not have pancreatitis because     of the above mentioned.  She reports a diagnosis of H. pylori many     years ago that again was not successfully treated.  We will go      ahead and check a stool antigen to see if H.  pylori is still     present and if so we will consider treatment as an outpatient with     some p.o. antiemetics to see if the patient does tolerate her     antibiotics.  Of note, the serum  H.  pylori antibody would be     useless at this point as she has documented history of H. pylori at     least per reports an this may be a false positive. 2. Anxiety.  We will continue  the patient's home regimen of Ativan     p.r.n. 3. Constipation.  We will continue the patient's Colace/MiraLax     regimen of a p.r.n. basis. 4. Code status.  The patient is a full code.  She states that in case     of an emergency and/or need for medical decision making her fiance,     Orpah Cobb, is to be contacted and his phone number is [phone].     Mariea Stable, MD     MA/MEDQ  D:  10/09/2010  T:  10/09/2010  Job:  [id number]  cc:   Dr. Cathey Endow  Electronically Signed by Mariea Stable MD on 10/11/2010 04:08:20 PM

## 2010-10-12 LAB — IRON AND TIBC
Saturation Ratios: 15 % — ABNORMAL LOW (ref 20–55)
TIBC: 291 ug/dL (ref 250–470)

## 2010-10-12 LAB — BASIC METABOLIC PANEL
CO2: 27 mEq/L (ref 19–32)
Chloride: 108 mEq/L (ref 96–112)
Creatinine, Ser: 0.85 mg/dL (ref 0.4–1.2)
GFR calc Af Amer: >60 mL/min (ref >60)

## 2010-10-12 LAB — VITAMIN B12: Vitamin B-12: 536 pg/mL (ref 211–911)

## 2010-10-12 LAB — FOLATE: Folate: >20 ng/mL

## 2010-10-12 LAB — CBC
Hemoglobin: 9.6 g/dL — ABNORMAL LOW (ref 12.0–15.0)
MCH: 30.8 pg (ref 26.0–34.0)
MCHC: 33 g/dL (ref 30.0–36.0)
Platelets: 231 10*3/uL (ref 150–400)
RDW: 13.5 % (ref 11.5–15.5)

## 2010-10-12 LAB — H.PYLORI ANTIGEN, STOOL

## 2010-10-15 NOTE — Discharge Summary (Addendum)
NAME:  Shelley [name], Shelley [name] NO.:  [id number]  MEDICAL RECORD NO.:  [id number]           PATIENT TYPE:  I  LOCATION:  5525                         FACILITY:  [facility]  PHYSICIAN:  Brendia Sacks, MD    DATE OF BIRTH:  [birth year]  DATE OF ADMISSION:  10/09/2010 DATE OF DISCHARGE:  10/12/2010                              DISCHARGE SUMMARY   PRIMARY CARE PROVIDER:  Dr. Cathey [name] at the [facility] in [place]  DISCHARGE DIAGNOSES: 1. Persistent nausea and vomiting probably secondary to antibiotics. 2. Hypokalemia secondary to persistent nausea and vomiting. 3. Chronic anemia. 4. Urinary tract infection. 5. Gastroesophageal reflux disease. 6. Recent surgery at [facility] to relax vaginal bladder sling. 7. Chronic constipation.  MEDICATIONS: 1. Zofran 8 mg p.o. every 8 hours for 3 days, then Zofran 8 mg p.o.     every 8 hours as needed for nausea. 2. Protonix 40 mg p.o. daily. 3. Phenergan 25 mg p.o. daily at bedtime for 3 days. 4. Elavil 150 mg p.o. daily at bedtime. 5. Lorazepam 0.5 mg p.o. b.i.d. 6. Pepto-Bismol 1-2 tablets p.o. every 4 hours as needed. 7. Famotidine 20 mg 2 tablets p.o. daily at bedtime.  LABS ON ADMISSION:  WBC is 8.6, hemoglobin 12.4, hematocrit 36.4, MCV 92.4, platelets 265, neutrophils 89%, absolute neutrophil 7.6.  Sodium 139, potassium 3.8, chloride 108, CO2 of 25, BUN 14, creatinine 0.77, glucose 123.  Total protein 5.8, albumin 2.8, calcium 8.1, lipase 26. Urinalysis with small leukocytes, moderate blood, 15 ketones, cloudy in appearance, 7-10 rbc's, 0-2 wbc's, many squamous epithelial cells. Urine drug screen was positive for benzos, otherwise negative.  Urine culture yield no growth.  On discharge, sodium 143, potassium 3.7, chloride 108, CO2 of 27, BUN 8, creatinine 0.85, glucose 92. Helicobacter pylori antigens positive.  Anemia panel with iron of 44, TIBC 291, percent saturation 15, UIBC 247.  Vitamin B12 of 536, folate greater than  20, ferritin 52.  RADIOLOGY:  None.  PROCEDURES:  None.  CONSULTS:  Shelley Fee, MD with Gastroenterology.  BRIEF HISTORY:  Shelley [name] is a 65 year old female with past medical history significant for symptomatic cystocele with urinary incontinence and uterine prolapse who underwent a total vaginal hysterectomy with bilateral salpingo-oophorectomy, anterior colporrhaphy and pubovaginal sling on Dec 27, 2009.  The patient reports that since that time, she had on ongoing issues with urinary tract infections requiring repeated antibiotics.  Subsequently few months after her surgery, she started having difficulty voiding to the point where she was required in and out of cast.  Do due this, the patient underwent surgery on October 06, 2010 to relax the sling to correct urinary retention and minimize the need for an in-and-out cath.  She reports to the emergency room on October 09, 2010 complaining of persistent nausea and vomiting.  She indicates that she had been prescribed nitrofurantoin 100 mg p.o. b.i.d. at the time of surgery and since that time she has felt nauseated.  She was unable to tolerate or keep anything oral due to the nausea and vomiting.  She came to the emergency room for further evaluation and treatment.  HOSPITAL COURSE  BY PROBLEM: 1. Persistent nausea, vomiting, probably secondary to nitrofurantoin.     Antibiotic has been discontinued.  The patient was admitted to the     medical floor, was provided with Zofran on an as-needed basis and     improved and short order.  She experienced no vomiting during her     hospitalization.  GI was consulted and recommended that she be put     on scheduled Zofran and nightly Phenergan which was implemented.     The next day, the patient was able to tolerate a regular diet and     reported no nausea.  Of note, the patient reports having a history     of Helicobacter pylori that was never successfully treated because      she could not tolerate the oral antibiotic regimen secondary to     nausea and vomiting.  H. pylori antigen test done during this     hospitalization came back positive.  The patient will follow up     with Dr. Arlyce [name] and [facility] in 10-14 days.  The patient will go     home on scheduled Zofran 8 mg for 3 days and then Zofran to be used     as needed for nausea. 2. Hypokalemia.  Potassium dropped to 3.3 probably secondary to     persistent nausea and vomiting during her hospitalization.     Potassium chloride was provided.  On discharge, the patient's     potassium 3.7. 3. Anemia.  The patient reports no recent history of being treated for     anemia.  The patient experienced no signs or symptoms of bleeding.     Her hemoglobin on admission 12.4.  On discharge after hydration     with IV antibiotics is 9.6.  Anemia panel yields a saturation     percentage of 15, otherwise was unremarkable to be followed up on     outpatient basis with her primary care provider. 4. Urinary tract infection.  The patient was started on Rocephin at     the time of admission in spite of urine wbc's being only 0-2 on     urinalysis.  The patient did report clinical symptoms of UTI when     GI consulted.  On October 11, 2010, Rocephin was discontinued. 5. Gastroesophageal reflux disease, stable during hospitalization.     Her PPI was continued. 6. Recent surgery at [facility] to relax her vaginal sling.  No issues with     urination, currently stable. 7. Chronic constipation.  The patient had 2 BMs while in the hospital.     We will continue on her home medications. 8. Physical exam documented in chart via rounding note dated October 12, 2010.  DISPOSITION AND FOLLOWUP:  The patient is to be discharged home. Followup with gastroenterologist, Dr. Arlyce [name] and [facility] in 10-14 days.  CONDITION ON DISCHARGE:  Stable.  Time spent coordinating discharge, 45 minutes.     Shelley Bender,  NP   ______________________________ Brendia Sacks, MD    KMB/MEDQ  D:  10/12/2010  T:  10/12/2010  Job:  [id number]  Electronically Signed by Toya Smothers  on 10/15/2010 08:29:41 AM Electronically Signed by Brendia Sacks  on 10/25/2010 09:00:42 PM

## 2010-10-20 ENCOUNTER — Telehealth: Payer: Self-pay | Admitting: Gastroenterology

## 2010-10-20 ENCOUNTER — Ambulatory Visit: Payer: Medicaid Other | Admitting: Gastroenterology

## 2010-10-26 NOTE — Progress Notes (Signed)
Summary: Pt cancelled   ---- Converted from flag ---- ---- 10/20/2010 8:07 AM, Tawni Levy wrote: patient cx because she was not feeling well , states she had surgery a couple weeks ago and still not better. OK ------------------------------ Dr Leota Jacobsen

## 2010-10-26 NOTE — Discharge Summary (Signed)
  NAME:  Shelley [name], DEFOOR NO.:  [id number]  MEDICAL RECORD NO.:  [id number]           PATIENT TYPE:  I  LOCATION:  5525                         FACILITY:  [facility]  PHYSICIAN:  Brendia Sacks, MD    DATE OF BIRTH:  1945-09-16  DATE OF ADMISSION:  10/09/2010 DATE OF DISCHARGE:  10/12/2010                              DISCHARGE SUMMARY   ADDENDUM  PRIMARY CARE PHYSICIAN:  Dr. Talmage Nap at [facility] in [place].  This is an addendum to [employer]  Updated medication list which supercedes previous dictation. 1. Promethazine 12.5 mg every 6 hours as needed for nausea or     vomiting, #20, no refills. 2. Protonix 40 mg p.o. daily. 3. Elavil 150 mg p.o. nightly. 4. Lorazepam 0.5 mg p.o. b.i.d. 5. Pepto-Bismol one to two tablets rather p.o. q.4 h. as needed for     stomach distress. 6. Simethicone 80 mg one to two tablets every 6 hours as needed for     gas.  Discontinuing the following medications once matting.     Brendia Sacks, MD     DG/MEDQ  D:  10/12/2010  T:  10/13/2010  Job:  [id number]  cc:   Dorisann Frames, M.D.  Electronically Signed by Brendia Sacks  on 10/25/2010 09:01:20 PM

## 2010-11-06 LAB — URINALYSIS, ROUTINE W REFLEX MICROSCOPIC
Bilirubin Urine: NEGATIVE
Hgb urine dipstick: NEGATIVE
Protein, ur: NEGATIVE mg/dL
Urobilinogen, UA: 0.2 mg/dL (ref 0.0–1.0)

## 2010-11-06 LAB — URINE CULTURE: Colony Count: >=100000

## 2010-11-07 LAB — COMPREHENSIVE METABOLIC PANEL
AST: 22 U/L (ref 0–37)
Albumin: 3.8 g/dL (ref 3.5–5.2)
Alkaline Phosphatase: 68 U/L (ref 39–117)
Chloride: 105 mEq/L (ref 96–112)
GFR calc Af Amer: >60 mL/min (ref >60)
Potassium: 4.3 mEq/L (ref 3.5–5.1)
Total Bilirubin: 0.3 mg/dL (ref 0.3–1.2)

## 2010-11-07 LAB — CBC
Hemoglobin: 10.6 g/dL — ABNORMAL LOW (ref 12.0–15.0)
MCHC: 34 g/dL (ref 30.0–36.0)
MCV: 95.4 fL (ref 78.0–100.0)
Platelets: 235 10*3/uL (ref 150–400)
RDW: 12.9 % (ref 11.5–15.5)
WBC: 6 10*3/uL (ref 4.0–10.5)

## 2010-11-28 LAB — URINALYSIS, ROUTINE W REFLEX MICROSCOPIC
Glucose, UA: NEGATIVE mg/dL
Hgb urine dipstick: NEGATIVE
Protein, ur: NEGATIVE mg/dL

## 2010-11-28 LAB — DIFFERENTIAL
Basophils Absolute: 0 10*3/uL (ref 0.0–0.1)
Basophils Relative: 0 % (ref 0–1)
Eosinophils Absolute: 0.1 10*3/uL (ref 0.0–0.7)
Eosinophils Relative: 2 % (ref 0–5)
Lymphocytes Relative: 48 % — ABNORMAL HIGH (ref 12–46)
Lymphs Abs: 4 10*3/uL (ref 0.7–4.0)
Monocytes Absolute: 0.6 10*3/uL (ref 0.1–1.0)
Monocytes Relative: 7 % (ref 3–12)
Neutro Abs: 3.5 10*3/uL (ref 1.7–7.7)
Neutrophils Relative %: 43 % (ref 43–77)

## 2010-11-28 LAB — CBC
HCT: 37.8 % (ref 36.0–46.0)
Hemoglobin: 13 g/dL (ref 12.0–15.0)
MCHC: 34.5 g/dL (ref 30.0–36.0)
MCV: 93.6 fL (ref 78.0–100.0)
Platelets: 255 10*3/uL (ref 150–400)
RBC: 4.04 MIL/uL (ref 3.87–5.11)
RDW: 13.1 % (ref 11.5–15.5)
WBC: 8.2 10*3/uL (ref 4.0–10.5)

## 2010-11-28 LAB — HEPATIC FUNCTION PANEL
Bilirubin, Direct: <0.1 mg/dL (ref 0.0–0.3)
Total Bilirubin: 0.4 mg/dL (ref 0.3–1.2)

## 2010-11-28 LAB — BASIC METABOLIC PANEL
BUN: 16 mg/dL (ref 6–23)
CO2: 26 mEq/L (ref 19–32)
Calcium: 9.5 mg/dL (ref 8.4–10.5)
Chloride: 104 mEq/L (ref 96–112)
Creatinine, Ser: 0.94 mg/dL (ref 0.4–1.2)
GFR calc Af Amer: >60 mL/min (ref >60)
GFR calc non Af Amer: >60 mL/min (ref >60)
Glucose, Bld: 106 mg/dL — ABNORMAL HIGH (ref 70–99)
Potassium: 3.6 mEq/L (ref 3.5–5.1)
Sodium: 141 mEq/L (ref 135–145)

## 2010-11-28 LAB — CK TOTAL AND CKMB (NOT AT ARMC)
CK, MB: 1.6 ng/mL (ref 0.3–4.0)
Relative Index: INVALID (ref 0.0–2.5)
Total CK: 99 U/L (ref 7–177)

## 2010-11-28 LAB — POCT CARDIAC MARKERS
Myoglobin, poc: 49.8 ng/mL (ref 12–200)
Troponin i, poc: <0.05 ng/mL (ref 0.00–0.09)

## 2010-11-28 LAB — TROPONIN I: Troponin I: 0.01 ng/mL (ref 0.00–0.06)

## 2011-01-02 NOTE — Assessment & Plan Note (Signed)
NAME:  Shelley [name], BASHOR NO.:  [id number]   MEDICAL RECORD NO.:  [id number]          PATIENT TYPE:  POB   LOCATION:  [facility] [place]         FACILITY:  [facility]   PHYSICIAN:  Allie Bossier, MD        DATE OF BIRTH:  [birth year]   DATE OF SERVICE:  08/01/2010                                  CLINIC NOTE   [name] is a 65 year old single white lady who had a TVH/BSO,  anterior colporrhaphy and an advantage pubovaginal sling for the  diagnoses of stress incontinence symptomatic, cystocele and uterine  prolapse.  This surgery was done on Dec 26, 2009.  During that case, she  did have a bladder perforation and she wore her catheter for  approximately a week.  She returns here on Jan 11, 2010, at her 2-week  postop visit, complaining that she was feeling like she was not  emptying.  I recommended timed voiding; however, she said she just did  not have time to do that.  At that time, ultrasound showed a 100 mL of  urine and post void residue was 90.  A culture was sent.  Over the  course of the next couple weeks, she continued to feel like she was not  emptying and on February 07, 2010, I did a postvoid residual here and it was  down to 45 mL.  Her exam was entirely within normal limits.  She  continued to complain of burning and urgency and she might see Dr. Anda Kraft on July 08, 2010.  At that time, Dr. Beverely Pace again  recommended a voiding diary, but she had not done it.  Dr. Beverely Pace  gave her a prescription for VESIcare for bladder spasms and the patient  said she took one, but she decided that she did not want to become  addicted to it.  She has do faith in god and feels that medicines  would not be necessary.  Her desire is to have the sling removed.   On exam today, her urinalysis shows positive nitrites.  I have sent a  culture.  Also, treating her with Bactrim DS for 3 days.  She says that  at the 5-day mark she becomes sensitized to Bactrim or sensitive  to  Bactrim.  I have also recommended that she take the Vesicare and told  her that may help relieve her symptoms.  She has severe vaginal atrophy  on exam, so I have given her samples of Estrace along with a  prescription to be used 1 g every other night.  She says that she found  somebody on the internet that she thinks she would like to see and the  number she has given me as for the [facility].  I spoke with the  secretary there and scheduled her an appointment at her request.  She  has an appointment there August 28, 2010, at 1:40 p.m.  I will see her  back in a few months to see how her visits with these other physicians  has gone.      Allie Bossier, MD     MCD/MEDQ  D:  08/01/2010  T:  08/02/2010  Job:  [id number]

## 2011-01-02 NOTE — Assessment & Plan Note (Signed)
NAME:  Shelley [name], Shelley [name] NO.:  [id number]   MEDICAL RECORD NO.:  [id number]          PATIENT TYPE:  POB   LOCATION:  [facility] at [place]         FACILITY:  [facility]   PHYSICIAN:  Allie Bossier, MD        DATE OF BIRTH:  1946/01/04   DATE OF SERVICE:  01/11/2010                                  CLINIC NOTE   She is a 65 year old lady, who is now 2 weeks postop status post TVH-  BSO, anterior colporrhaphy, and pubovaginal sling (Advantage Fit).  She  comes in because she says she feels like she is not emptying.  She spoke  with Vernona Rieger, the RN, earlier and Vernona Rieger told her to do timed voiding;  however, she says that she has got too many other things on her mind to  do timed voiding.  Her exam is normal.  Please note that she says her  chronic constipation initially resolved after surgery but now is  returning to her normal preop chronic constipation status.  Her urine  appears clear.  Ultrasound shows about 100 mL of urine in the bladder,  postvoid residual shows 90 mL, I sent a culture.  I have explained to  her that it may take a few more weeks before her complete emptying  returns to normal.  I have also recommended that she finds time to do  the timed voiding during the day and that she should come back if she  feels that she is not emptying or if she is uncomfortable.  She will  make a regular scheduled visit for 4 weeks now unless she needs to be  seen earlier.      Allie Bossier, MD     MCD/MEDQ  D:  01/11/2010  T:  01/12/2010  Job:  [id number]

## 2011-01-02 NOTE — Group Therapy Note (Signed)
NAME:  Shelley [name], Shelley [name] NO.:  [id number]   MEDICAL RECORD NO.:  [id number]          PATIENT TYPE:  WOC   [facility]ATION[facility]  [facility]                   FACILITY:  [facility]   PHYSICIAN:  Allie Bossier, MD        DATE OF BIRTH:  06-05-1946   DATE OF SERVICE:  06/03/2008                                  CLINIC NOTE   IDENTIFICATION:  Shelley [name] is a 65 year old non-sexually active, gravida 5,  para 2, abortus 3, who comes in as a referral from [facility] at the  [facility] for evaluation for cystocele and associated  urinary stress incontinence.  She states that she has been using a  pessary daily (she removes it at night) for the last 6 months, but it  was given to her years ago and it has not been refitted.  She has seen  one physician (Dr. Tenny Craw), who told her that she need a hysterectomy and  she did not see him again.  She has suffered from vaginal atrophy and in  fact has vaginal itching, but has not used vaginal estrogen.  On further  discussion with this patient, she mentions that for the last year she  has been having off and on postmenopausal bleeding.  This has not been  evaluated either.  On physical exam, she has a third-degree cystocele,  no rectocele, she has second-degree uterine prolapse, and somewhat  stenotic cervix.   REVIEW OF SYSTEMS:  She has been abstinent for 3 years and also  complains of intermittent bilateral pelvic pain.  On exam, in addition  to the prolapse issue, she had a right periclitoral lesion.  This was  biopsied and sent to Pathology today.  She has excellent bone structure  for a vaginal hysterectomy, should this be needed.   ASSESSMENT AND PLAN:  1. Postmenopausal bleeding.  I will schedule an ultrasound.  2. Symptomatic uterine/bladder prolapse with genuine stress urinary      incontinence.  I have suggested a vaginal hysterectomy, anterior      repair, and tension-free vaginal tape.  She does seem amenable to      this.  I have  recommended that she use 0.5 g of vaginal estrogen      every other night, starting now, and for at least a month preceding      surgery.  All questions were answered.      Allie Bossier, MD     MCD/MEDQ  D:  06/03/2008  T:  06/04/2008  Job:  [id number]
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
## 2011-01-02 NOTE — Assessment & Plan Note (Signed)
[facility]                            CARDIOLOGY OFFICE NOTE   NAME:JOHNSONPorsha, Skilton                     MRN:          [id number]  DATE:04/30/2007                            DOB:          Jun 16, 1946    The patient is a 65 year old female with a past medical history if  irritable bowel syndrome, diverticulitis, hemorrhoids, gastroesophageal  reflux disease, depression, anxiety, and bladder prolapse, who we are  asked to evaluate for chest pain.  She has no prior cardiac history.  Of  note, she also has some dyspnea on exertion, but there is no orthopnea,  PND or peripheral edema.  She has had intermittent chest pain for quite  some time.  The pain is in the left breast area and described as a  squeezing.  The pain does not radiate.  It is not pleuritic or  positional, nor it is related to food.  It is not exertional.  It can  last hours at a time.  Note:  She can have associated shortness of  breath, but this is not routine.  There is an occasional diaphoresis and  nausea, but again, this is not there at all times.  Because of her pain,  we were asked to further evaluate.  Note:  She is also concerned about  these symptoms, as she has had a friend who has had coronary artery  bypass and graft.   PRESENT MEDICATIONS:  1. Elavil 150 mg p.o. nightly.  2. Ativan 0.5 mg p.o. b.i.d.   ALLERGIES:  She states she has multiple drug allergies including  PENICILLIN, ASPIRIN, BETADINE, DEMEROL and LIBRIUM.  Note:  She  apparently has had MULTIPLE MEDICATION INTOLERANCES in the past.  She  states that she has not tolerated any ANTIBIOTICS.   FAMILY HISTORY:  Positive for congestive heart failure in her father,  but this occurred at age 48 by her report.   PAST MEDICAL HISTORY:  There is no diabetes mellitus, hypertension or  hyperlipidemia.  There is a history of a decreased TSH, but followup was  normal by her report.  She does occasionally have bronchitis.   She has a  history of esophageal dilatations, gastroesophageal reflux disease and  hiatal hernia.  She has also diverticulitis and irritable bowel  syndrome.  She has a history of bladder prolapse.  There is a history of  anxiety and depression.   SOCIAL HISTORY:  She does not smoke and only rarely consume alcohol.   REVIEW OF SYSTEMS:  She denies any headaches or fevers or chills.  She  does have a cough.  There is no hemoptysis.  There is no odynophagia,  but she occasionally has dysphagia, but is being seen by a  gastroenterologist.  She also has had an hematochezia, which her primary  care physician knows about.  There is no melena.  There is no dysuria or  hematuria.  There are no rashes or seizure activity.  There is no  orthopnea, PND or peripheral edema.  The remainder systems are negative.   PHYSICAL EXAMINATION:  VITAL SIGNS:  Her blood pressure is  107/67 and  her pulse is 75.  She weighs 147 pounds.  She is well-developed and somewhat anxious.  She is in no acute  distress.  SKIN:  Warm and dry.  There is no peripheral clubbing.  BACK:  Normal.  HEENT:  Normal with normal eyelids.  NECK:  Supple with a normal upstroke bilaterally and there are no bruits  noted.  There is no jugulovenous distention and no thyromegaly is noted.  CHEST:  Clear to auscultation with normal expansion.  CARDIOVASCULAR:  Regular rate and rhythm, normal S1 and S2.  There are  no murmurs, rubs, or gallops noted.  ABDOMEN:  Not tender or distended.  Positive bowel sounds.  No  hepatosplenomegaly and no mass appreciated.  There is no abdominal  bruit.  EXTREMITIES:  She has 2+ femoral pulses bilaterally and no bruits.  Extremities show no edema and I can palpate no cords. She has 2+  dorsalis pedis pulses bilaterally.  NEUROLOGICAL:  Exam is grossly intact.   ELECTROCARDIOGRAM:  Shows a sinus rhythm at a rate of 83.  The axis is  normal.  There are no ST changes noted.   DIAGNOSES:  1.  Atypical chest pain -- her symptoms are very atypical in their      description.  She also has no significant risk factors.  Her      electrocardiogram is normal.  However, she is very concerned about      these symptoms and we will plan to proceed with a stress Myoview      for risk stratification.  If it shows normal perfusion, we will not      proceed with further cardiac workup.  2. History of irritable bowel syndrome/gastroesophageal reflux disease      and hiatal hernia -- this will be managed per her primary care      physician.  3. History of anxiety and depression -- this will be managed by her      primary care physician.  4. The patient has requested carotid Dopplers, abdominal ultrasound      and ankle-brachial indices.  She apparently had no claudication and      I do not hear bruits or feel pulsatile masses on exam.  We will      arrange this, but I have instructed her that this may not be      covered.  She could also consider having these obtained as an      outpatient with one of the services that she asked about today.  We      will see her back on an as-needed basis pending results of her      Myoview.     Madolyn Frieze Jens Som, MD, [facility]  Electronically Signed    BSC/MedQ  DD: 04/30/2007  DT: 05/01/2007  Job #: [id number]   cc:   Johney Maine, M.D.

## 2011-01-02 NOTE — Assessment & Plan Note (Signed)
NAME:  Shelley [name], Shelley [name] NO.:  [id number]   MEDICAL RECORD NO.:  [id number]          PATIENT TYPE:  POB   LOCATION:  [facility] [place]         FACILITY:  [facility]   PHYSICIAN:  Allie Bossier, MD        DATE OF BIRTH:  [birth year]   DATE OF SERVICE:  02/07/2010                                  CLINIC NOTE   [name] is a 65 year old single white lady who is now 6 weeks postop  status post TVH, BSO, anterior colporrhaphy, advantage fit  pubovaginal  sling for the diagnoses of stress incontinence, symptomatic cystocele,  and uterine prolapse.  She came in on Jan 11, 2010, complaining of a  burning sensation in her bladder with voiding and feeling like she is  incompletely emptying.  At that time, her postvoid residual was 90 mL.  Culture was sent, it was negative.  It was recommended that she do timed  voiding, but again she says that she does not have time.  She is too  busy to do timed voiding, but she still complains that she feels like  she is not emptying today.  Her postvoid residual was 45 mL.  Her  urinalysis today is negative.  She also complains of intermittent  diarrhea and chronic constipation.  She saw her GI doctor, Dr. Corinda Gubler  prior to surgery and I have recommended that she see him again.   On exam, the scars from her pubovaginal sling on her symphysis pubis are  not visible.  Her vaginal cuff is healed well.  Bimanual is normal.   Postvoid residual was 45 mL.  Urinalysis is negative today, and I have  told her that I feel that she is getting better.  I have recommended  timed voiding once more, but I feel that she will continue to improve  over time.  I told her that to come back next month if she has not  improved.  With regard to her alternating diarrhea and constipation, I  have recommended she go back to see her gastroenterologist.  Please note  that she has already had intercourse, but to a limited decrease.  I have  told her that she is now  clear to have resumed normal function.      Allie Bossier, MD     MCD/MEDQ  D:  02/07/2010  T:  02/08/2010  Job:  [id number]

## 2011-01-02 NOTE — Group Therapy Note (Signed)
NAME:  Shelley [name], Shelley [name] NO.:  [id number]   MEDICAL RECORD NO.:  [id number]          PATIENT TYPE:  WOC   LOCATION:  [facility]                   FACILITY:  [facility]   PHYSICIAN:  Allie Bossier, MD        DATE OF BIRTH:  [birth year]   DATE OF SERVICE:  07/14/2008                                  CLINIC NOTE   Shelley [name] is a 65 year old gravida 5, para 2, abortus 3, who comes in here  as a follow-up from her last visit on 06/03/2008.  At that time she had  a periclitoral lesion that I biopsied and it showed herpes simplex  virus.  I have given her prescription for acyclovir ointment.  She says  it is currently asymptomatic and we discussed the recurrent nature of  herpes.   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  She has been wearing  a pessary for the last 4 to 5 months but she is displeased with the  results she gets from this and wishes to proceed with a hysterectomy and  TVT and anterior repair.  In preparation for this, I had given her a  prescription for vaginal estrogen at her last visit.  She says that she  tried this but it made her nauseated, but I again explained that due to  her atrophy, she will need to be on some form of vaginal estrogen at  least a month prior to surgery.  I have therefore given her a  prescription for Vagifem 25 mcg tablets to be used 2 to 3 times a week  and preparation for surgery to be scheduled in the beginning of the new  year.  The surgery would be a vaginal hysterectomy, anterior repair,  possible posterior repair, bilateral salpingo-oophorectomy, and a  tension-free vaginal tape.  We discussed risks of surgery, all questions  were explained and all questions were answered, and her paperwork will  be turned in to schedule surgery.      Allie Bossier, MD     MCD/MEDQ  D:  07/14/2008  T:  07/15/2008  Job:  [id number]

## 2011-01-19 ENCOUNTER — Other Ambulatory Visit: Payer: Self-pay | Admitting: Sports Medicine

## 2011-01-26 ENCOUNTER — Other Ambulatory Visit: Payer: Self-pay | Admitting: Sports Medicine

## 2011-03-15 ENCOUNTER — Other Ambulatory Visit: Payer: Self-pay | Admitting: Podiatry

## 2011-06-01 LAB — POCT URINALYSIS DIP (DEVICE)
Hgb urine dipstick: NEGATIVE
Ketones, ur: NEGATIVE
Protein, ur: NEGATIVE
Specific Gravity, Urine: 1.015
pH: 7

## 2011-08-23 ENCOUNTER — Encounter: Payer: Self-pay | Admitting: Emergency Medicine

## 2011-08-23 ENCOUNTER — Emergency Department (HOSPITAL_COMMUNITY)
Admission: EM | Admit: 2011-08-23 | Discharge: 2011-08-23 | Disposition: A | Discharge status: Home / Self Care | Payer: Medicaid Other | Attending: Emergency Medicine | Admitting: Emergency Medicine

## 2011-08-23 ENCOUNTER — Emergency Department (HOSPITAL_COMMUNITY): Payer: Medicaid Other

## 2011-08-23 DIAGNOSIS — K589 Irritable bowel syndrome without diarrhea: Secondary | ICD-10-CM | POA: Insufficient documentation

## 2011-08-23 DIAGNOSIS — Z79899 Other long term (current) drug therapy: Secondary | ICD-10-CM | POA: Insufficient documentation

## 2011-08-23 DIAGNOSIS — K219 Gastro-esophageal reflux disease without esophagitis: Secondary | ICD-10-CM | POA: Insufficient documentation

## 2011-08-23 DIAGNOSIS — M546 Pain in thoracic spine: Secondary | ICD-10-CM | POA: Insufficient documentation

## 2011-08-23 DIAGNOSIS — M549 Dorsalgia, unspecified: Secondary | ICD-10-CM

## 2011-08-23 HISTORY — DX: Irritable bowel syndrome, unspecified: K58.9

## 2011-08-23 HISTORY — DX: Gastro-esophageal reflux disease without esophagitis: K21.9

## 2011-08-23 LAB — COMPREHENSIVE METABOLIC PANEL
ALT: 12 U/L (ref 0–35)
Albumin: 3.4 g/dL — ABNORMAL LOW (ref 3.5–5.2)
BUN: 20 mg/dL (ref 6–23)
CO2: 26 mEq/L (ref 19–32)
Creatinine, Ser: 0.8 mg/dL (ref 0.50–1.10)
GFR calc Af Amer: 89 mL/min — ABNORMAL LOW (ref >90)
Potassium: 4.1 mEq/L (ref 3.5–5.1)
Total Bilirubin: 0.1 mg/dL — ABNORMAL LOW (ref 0.3–1.2)
Total Protein: 7 g/dL (ref 6.0–8.3)

## 2011-08-23 LAB — D-DIMER, QUANTITATIVE: D-Dimer, Quant: 0.42 ug/mL-FEU (ref 0.00–0.48)

## 2011-08-23 LAB — CBC
HCT: 33.8 % — ABNORMAL LOW (ref 36.0–46.0)
MCHC: 32.8 g/dL (ref 30.0–36.0)
MCV: 93.4 fL (ref 78.0–100.0)
Platelets: 264 10*3/uL (ref 150–400)
RDW: 12.9 % (ref 11.5–15.5)

## 2011-08-23 MED ORDER — CYCLOBENZAPRINE HCL 10 MG PO TABS: 10.0000 mg | ORAL_TABLET | Freq: Two times a day (BID) | ORAL | Status: AC | PRN

## 2011-08-23 MED ORDER — IBUPROFEN 800 MG PO TABS: 800.0000 mg | ORAL_TABLET | Freq: Three times a day (TID) | ORAL | Status: AC

## 2011-08-23 MED ORDER — HYDROCODONE-ACETAMINOPHEN 5-500 MG PO TABS: 1.0000 | ORAL_TABLET | Freq: Four times a day (QID) | ORAL | Status: AC | PRN

## 2011-08-23 NOTE — ED Notes (Signed)
PT. REPORTS RIGHT UPPER BACK PAIN FOR 3 DAYS , DENIES FALL OR INJURY ,  SLIGHT SOB , NO COUGH . NO FEVER , SLIGHT CHILLS.

## 2011-08-23 NOTE — ED Notes (Signed)
Patient transported to X-ray 

## 2011-08-23 NOTE — ED Provider Notes (Signed)
History     CSN: [id number]  Arrival date & time 08/23/11  0303   First MD Initiated Contact with Patient 08/23/11 0501      Chief Complaint  Patient presents with  . Back Pain    (Consider location/radiation/quality/duration/timing/severity/associated sxs/prior treatment) Patient is a 66 y.o. female presenting with back pain. The history is provided by the patient.  Back Pain  This is a new problem. Episode onset: About 3 days ago. The problem occurs constantly. The problem has not changed since onset.The pain is associated with no known injury. The pain is present in the thoracic spine. The quality of the pain is described as shooting and stabbing. The pain does not radiate. The pain is moderate. The symptoms are aggravated by bending and twisting (With deep breathing). Worse during: She notices more at nighttime when she goes to bed. Pertinent negatives include no chest pain, no fever, no numbness, no weight loss, no headaches, no abdominal pain, no abdominal swelling, no bowel incontinence, no perianal numbness, no bladder incontinence, no dysuria, no pelvic pain, no leg pain, no paresthesias, no paresis, no tingling and no weakness. She has tried NSAIDs for the symptoms. The treatment provided mild relief.   patient states she has a history of pleurisy and feels similar to this. Hurts when she takes deep breath. She is a nonsmoker and denies any recent cough cold or illness. Pain is located right upper back is sharp and sometimes burning in nature and she states it feels like it's her lungs.  Past Medical History  Diagnosis Date  . GERD (gastroesophageal reflux disease)   . IBS (irritable bowel syndrome)     Past Surgical History  Procedure Date  . Abdominal hysterectomy     No family history on file.  History  Substance Use Topics  . Smoking status: Never Smoker   . Smokeless tobacco: Not on file  . Alcohol Use: No    OB History    Grav Para Term Preterm Abortions TAB  SAB Ect Mult Living                  Review of Systems  Constitutional: Negative for fever, chills and weight loss.  HENT: Negative for neck pain and neck stiffness.   Eyes: Negative for pain.  Respiratory: Negative for cough, shortness of breath and wheezing.   Cardiovascular: Negative for chest pain.  Gastrointestinal: Negative for abdominal pain and bowel incontinence.  Genitourinary: Negative for bladder incontinence, dysuria and pelvic pain.  Musculoskeletal: Positive for back pain.  Skin: Negative for rash.  Neurological: Negative for tingling, weakness, numbness, headaches and paresthesias.  All other systems reviewed and are negative.    Allergies  Aspirin; Meperidine hcl; Povidone-iodine; Amoxicillin; and Penicillins  Home Medications   Current Outpatient Rx  Name Route Sig Dispense Refill  . AMITRIPTYLINE HCL 150 MG PO TABS Oral Take 150 mg by mouth at bedtime.      Marland Kitchen BISMUTH SUBSALICYLATE 262 MG PO CHEW Oral Chew 524 mg by mouth 3 (three) times daily as needed. Stomach pain     . CALCIUM CARB-CHOLECALCIFEROL 500-400 MG-UNIT PO CHEW Oral Chew 1 tablet by mouth daily.      Marland Kitchen FAMOTIDINE 40 MG PO TABS Oral Take 40 mg by mouth at bedtime.      Marland Kitchen LORAZEPAM 0.5 MG PO TABS Oral Take 0.5 mg by mouth 2 (two) times daily as needed. Anxiety and insomnia     . CYCLOBENZAPRINE HCL 10 MG PO TABS  Oral Take 1 tablet (10 mg total) by mouth 2 (two) times daily as needed for muscle spasms. 20 tablet 0  . HYDROCODONE-ACETAMINOPHEN 5-500 MG PO TABS Oral Take 1-2 tablets by mouth every 6 (six) hours as needed for pain. 15 tablet 0  . IBUPROFEN 800 MG PO TABS Oral Take 1 tablet (800 mg total) by mouth 3 (three) times daily. 21 tablet 0    BP 108/67  Pulse 69  Temp(Src) 97.5 F (36.4 C) (Oral)  Resp 18  SpO2 100%  Physical Exam  Constitutional: She is oriented to person, place, and time. She appears well-developed and well-nourished.  HENT:  Head: Normocephalic and atraumatic.    Eyes: Conjunctivae and EOM are normal. Pupils are equal, round, and reactive to light.  Neck: Trachea normal. Neck supple. No thyromegaly present.  Cardiovascular: Normal rate, regular rhythm, S1 normal, S2 normal and normal pulses.     No systolic murmur is present   No diastolic murmur is present  Pulses:      Radial pulses are 2+ on the right side, and 2+ on the left side.  Pulmonary/Chest: Effort normal and breath sounds normal. She has no wheezes. She has no rhonchi. She has no rales. She exhibits no tenderness.  Abdominal: Soft. Normal appearance and bowel sounds are normal. There is no tenderness. There is no CVA tenderness and negative Murphy's sign.  Musculoskeletal:       Pain localized to right upper back and scapular region no reproducible tenderness. No rash or crepitus. No midline cervical or thoracic tenderness.  BLE:s Calves nontender, no cords or erythema, negative Homans sign  Neurological: She is alert and oriented to person, place, and time. She has normal strength. No cranial nerve deficit or sensory deficit. GCS eye subscore is 4. GCS verbal subscore is 5. GCS motor subscore is 6.  Skin: Skin is warm and dry. No rash noted. She is not diaphoretic.  Psychiatric: Her speech is normal.       Cooperative and appropriate    ED Course  Procedures (including critical care time)  Labs Reviewed  CBC - Abnormal; Notable for the following:    RBC 3.62 (*)    Hemoglobin 11.1 (*)    HCT 33.8 (*)    All other components within normal limits  COMPREHENSIVE METABOLIC PANEL - Abnormal; Notable for the following:    Glucose, Bld 118 (*)    Albumin 3.4 (*)    Total Bilirubin 0.1 (*)    GFR calc non Af Amer 77 (*)    GFR calc Af Amer 89 (*)    All other components within normal limits  D-DIMER, QUANTITATIVE   Dg Chest 2 View  08/23/2011  *RADIOLOGY REPORT*  Clinical Data: Posterior chest pain with inspiration  CHEST - 2 VIEW  Comparison: 11/25/2009  Findings: The heart size  and pulmonary vascularity are normal. The lungs appear clear and expanded without focal air space disease or consolidation. No blunting of the costophrenic angles.  Tortuous aorta no pneumothorax.  No significant change since previous study.  IMPRESSION: No evidence of active pulmonary disease.  Original Report Authenticated By: Marlon Pel, M.D.     1. Back pain       MDM   Right upper back pain with some components of pleurisy. Negative d-dimer and no DVT symptoms makes PE very unlikely. There is no rash or zoster. No reproducible pain with movement of arm but is increased with deep breathing. Chest x-ray obtained and reviewed  without evidence of pneumothorax or pneumonia. Pain improved emergency department and patient feels comfortable to go home and followup with her doctor as needed.        Sunnie Nielsen, MD 08/23/11 [phone]

## 2011-08-23 NOTE — ED Notes (Signed)
Patient returned from X-ray 

## 2011-12-21 ENCOUNTER — Encounter (HOSPITAL_COMMUNITY): Payer: Self-pay | Admitting: Emergency Medicine

## 2011-12-21 ENCOUNTER — Emergency Department (HOSPITAL_COMMUNITY)
Admission: EM | Admit: 2011-12-21 | Discharge: 2011-12-21 | Disposition: A | Discharge status: Home / Self Care | Payer: Medicaid Other | Attending: Emergency Medicine | Admitting: Emergency Medicine

## 2011-12-21 DIAGNOSIS — K219 Gastro-esophageal reflux disease without esophagitis: Secondary | ICD-10-CM | POA: Insufficient documentation

## 2011-12-21 DIAGNOSIS — K589 Irritable bowel syndrome without diarrhea: Secondary | ICD-10-CM | POA: Insufficient documentation

## 2011-12-21 DIAGNOSIS — R21 Rash and other nonspecific skin eruption: Secondary | ICD-10-CM

## 2011-12-21 MED ORDER — PERMETHRIN 5 % EX CREA: TOPICAL_CREAM | CUTANEOUS | Status: AC

## 2011-12-21 MED ORDER — HYDROXYZINE HCL 25 MG PO TABS: 25.0000 mg | ORAL_TABLET | Freq: Four times a day (QID) | ORAL | Status: AC

## 2011-12-21 NOTE — ED Notes (Signed)
PT. REPORTS PERSISTENT ITCHY RASHES AT ARMS , UPPER ABDOMEN AND BACK FOR SEVERAL WEEKS , SEEN BY PCP AND DERMATOLOGIST DIAGNOSED WITH DERMATITIS .

## 2011-12-21 NOTE — Discharge Instructions (Signed)
Rash A rash is a change in the color or texture of your skin. There are many different types of rashes. You may have other problems that accompany your rash. CAUSES   Infections.   Allergic reactions. This can include allergies to pets or foods.   Certain medicines.   Exposure to certain chemicals, soaps, or cosmetics.   Heat.   Exposure to poisonous plants.   Tumors, both cancerous and noncancerous.  SYMPTOMS   Redness.   Scaly skin.   Itchy skin.   Dry or cracked skin.   Bumps.   Blisters.   Pain.  DIAGNOSIS  Your caregiver may do a physical exam to determine what type of rash you have. A skin sample (biopsy) may be taken and examined under a microscope. TREATMENT  Treatment depends on the type of rash you have. Your caregiver may prescribe certain medicines. For serious conditions, you may need to see a skin doctor (dermatologist). HOME CARE INSTRUCTIONS   Avoid the substance that caused your rash.   Do not scratch your rash. This can cause infection.   You may take cool baths to help stop itching.   Only take over-the-counter or prescription medicines as directed by your caregiver.   Keep all follow-up appointments as directed by your caregiver.  SEEK IMMEDIATE MEDICAL CARE IF:  You have increasing pain, swelling, or redness.   You have a fever.   You have new or severe symptoms.   You have body aches, diarrhea, or vomiting.   Your rash is not better after 3 days.  MAKE SURE YOU:  Understand these instructions.   Will watch your condition.   Will get help right away if you are not doing well or get worse.  Document Released: 07/27/2002 Document Revised: 07/26/2011 Document Reviewed: 05/21/2011 [employer] Patient Information 2012 [employer]. 

## 2011-12-21 NOTE — ED Provider Notes (Signed)
History     CSN: [id number]  Arrival date & time 12/21/11  2123   First MD Initiated Contact with Patient 12/21/11 2147      9:49 PM HPI Patient reports a rash for several weeks. Was seen by her dermatologist and told that she has dermatitis did not do any test. Reports she had a positive exposure to scabies and fears that she may have scabies. Reports her dermatologist gave her a steroid shot which has not helped symptoms and she continues to have severe pruritus.  Patient is a 66 y.o. female presenting with rash. The history is provided by the patient.  Rash  This is a new problem. The problem has been gradually worsening. The problem is associated with an unknown factor. There has been no fever. The rash is present on the back, abdomen, head, face, left arm, left upper leg, left lower leg, right arm, right upper leg and right lower leg. The pain is at a severity of 0/10. The patient is experiencing no pain. The pain has been constant since onset. Associated symptoms include itching. She has tried steriods for the symptoms. The treatment provided no relief. Risk factors include new environmental exposures.    Past Medical History  Diagnosis Date  . GERD (gastroesophageal reflux disease)   . IBS (irritable bowel syndrome)     Past Surgical History  Procedure Date  . Abdominal hysterectomy     No family history on file.  History  Substance Use Topics  . Smoking status: Never Smoker   . Smokeless tobacco: Not on file  . Alcohol Use: No    OB History    Grav Para Term Preterm Abortions TAB SAB Ect Mult Living                  Review of Systems  Constitutional: Negative for fever and chills.  HENT: Negative for rhinorrhea.   Eyes: Negative for redness.  Respiratory: Negative for cough, shortness of breath and wheezing.   Cardiovascular: Negative for chest pain and palpitations.  Gastrointestinal: Negative for nausea.  Skin: Positive for itching and rash.       Pruritus    All other systems reviewed and are negative.    Allergies  Aspirin; Meperidine hcl; Povidone; Amoxicillin; Betadine; and Penicillins  Home Medications   Current Outpatient Rx  Name Route Sig Dispense Refill  . AMITRIPTYLINE HCL 150 MG PO TABS Oral Take 150 mg by mouth at bedtime.      Marland Kitchen BISMUTH SUBSALICYLATE 262 MG PO CHEW Oral Chew 524 mg by mouth 3 (three) times daily as needed. Stomach pain     . CALCIUM CARB-CHOLECALCIFEROL 500-400 MG-UNIT PO CHEW Oral Chew 1 tablet by mouth daily.      Marland Kitchen FAMOTIDINE 40 MG PO TABS Oral Take 40 mg by mouth at bedtime.      Marland Kitchen LORAZEPAM 0.5 MG PO TABS Oral Take 0.5 mg by mouth 2 (two) times daily as needed. Anxiety and insomnia     . SIMETHICONE 125 MG PO CHEW Oral Chew 125 mg by mouth every 6 (six) hours as needed. For gas      BP 129/81  Pulse 93  Temp(Src) 98 F (36.7 C) (Oral)  Resp 20  SpO2 98%  Physical Exam  Vitals reviewed. Constitutional: She is oriented to person, place, and time. Vital signs are normal. She appears well-developed and well-nourished. No distress.  HENT:  Head: Normocephalic and atraumatic.  Eyes: Pupils are equal, round, and reactive to  light.  Neck: Neck supple.  Pulmonary/Chest: Effort normal.  Neurological: She is alert and oriented to person, place, and time.  Skin: Skin is warm and dry. No rash noted. No erythema. No pallor.       Patient has small macular lesions on abdomen arms and legs. Possible Burrows on abdomen. Lesions appear to be dry and slightly erythematous.  Psychiatric: She has a normal mood and affect. Her behavior is normal.    ED Course  Procedures   MDM   Advised patient that we do not to skin testing for scabies however will prescribe her permethrin and advised followup with her dermatologist for further evaluation if needed. Will also prescribe hydroxyzine for itching. Patient agrees with plan and is ready for discharge       Thomasene Lot, Cordelia Poche 12/21/11 2150

## 2011-12-22 NOTE — ED Provider Notes (Signed)
Medical screening examination/treatment/procedure(s) were performed by non-physician practitioner and as supervising physician I was immediately available for consultation/collaboration.  Sol Odor R. Wetona Viramontes, MD 12/22/11 2337 

## 2012-01-27 ENCOUNTER — Encounter (HOSPITAL_COMMUNITY): Payer: Self-pay

## 2012-01-27 ENCOUNTER — Emergency Department (HOSPITAL_COMMUNITY)
Admission: EM | Admit: 2012-01-27 | Discharge: 2012-01-27 | Disposition: A | Discharge status: Home / Self Care | Payer: Medicaid Other | Source: Home / Self Care | Attending: Emergency Medicine | Admitting: Emergency Medicine

## 2012-01-27 DIAGNOSIS — Z2089 Contact with and (suspected) exposure to other communicable diseases: Secondary | ICD-10-CM

## 2012-01-27 MED ORDER — PERMETHRIN 5 % EX CREA: TOPICAL_CREAM | CUTANEOUS | Status: AC

## 2012-01-27 NOTE — Discharge Instructions (Signed)
Wash all of your bedding in HOT water.  Follow up with your doctor if your symptoms don't get better with this treatment.

## 2012-01-27 NOTE — ED Provider Notes (Signed)
History     CSN: [id number]  Arrival date & time 01/27/12  1542   First MD Initiated Contact with Patient 01/27/12 1735      Chief Complaint  Patient presents with  . Rash    (Consider location/radiation/quality/duration/timing/severity/associated sxs/prior treatment) HPI Comments: Pt reports had scabies a month ago, received tx but feels one tx did not eliminate all of scabies mites.  Feels they are still on her, she still has a few spots where she scratched that haven't healed yet. Also, shares home with someone who was not tx for scabies along with her.   Patient is a 66 y.o. female presenting with rash. The history is provided by the patient.  Rash  This is a recurrent problem. Episode onset: 2 months ago. The problem has been gradually worsening. There has been no fever. The patient is experiencing no pain. Associated symptoms include itching. Treatments tried: tx for scabies. The treatment provided significant relief.    Past Medical History  Diagnosis Date  . GERD (gastroesophageal reflux disease)   . IBS (irritable bowel syndrome)     Past Surgical History  Procedure Date  . Abdominal hysterectomy     History reviewed. No pertinent family history.  History  Substance Use Topics  . Smoking status: Never Smoker   . Smokeless tobacco: Not on file  . Alcohol Use: No    OB History    Grav Para Term Preterm Abortions TAB SAB Ect Mult Living                  Review of Systems  Constitutional: Negative for fever and chills.  Skin: Positive for itching and rash.    Allergies  Aspirin; Meperidine hcl; Povidone; Amoxicillin; Betadine; and Penicillins  Home Medications   Current Outpatient Rx  Name Route Sig Dispense Refill  . AMITRIPTYLINE HCL 150 MG PO TABS Oral Take 150 mg by mouth at bedtime.      Marland Kitchen BISMUTH SUBSALICYLATE 262 MG PO CHEW Oral Chew 524 mg by mouth 3 (three) times daily as needed. Stomach pain     . CALCIUM CARB-CHOLECALCIFEROL 500-400 MG-UNIT  PO CHEW Oral Chew 1 tablet by mouth daily.      Marland Kitchen FAMOTIDINE 40 MG PO TABS Oral Take 40 mg by mouth at bedtime.      Marland Kitchen LORAZEPAM 0.5 MG PO TABS Oral Take 0.5 mg by mouth 2 (two) times daily as needed. Anxiety and insomnia     . PERMETHRIN 5 % EX CREA  Apply from neck down to soles of feet x1, wash off after 8-14 hours.          Pharmacist: please dispense quantity sufficient to treat 2 adults 60 g 0  . SIMETHICONE 125 MG PO CHEW Oral Chew 125 mg by mouth every 6 (six) hours as needed. For gas      BP 115/72  Pulse 73  Temp(Src) 98.3 F (36.8 C) (Oral)  Resp 20  SpO2 98%  Physical Exam  Constitutional: She appears well-developed and well-nourished. No distress.  Pulmonary/Chest: Effort normal.  Skin: Skin is warm and dry.       No rash on hands, no burrows noted.  Pt has 3 small healing sores on back near bra strap on L side that are not completely healed.  No evidence of scabies.      ED Course  Procedures (including critical care time)  Labs Reviewed - No data to display No results found.   1. Exposure to  scabies       MDM  Pt did not wash bedding,etc in hot water. Housemate was not tx for scabies.  Although I see no evidence of active scabies on pt exam, will retreat, treat housemate and have pt wash everything in hot water.         Cathlyn Parsons, NP 01/27/12 [phone]

## 2012-01-27 NOTE — ED Provider Notes (Signed)
Medical screening examination/treatment/procedure(s) were performed by non-physician practitioner and as supervising physician I was immediately available for consultation/collaboration.  Luiz Blare MD   Luiz Blare, MD 01/27/12 2015

## 2012-01-27 NOTE — ED Notes (Signed)
Pt states she has been treated for scabies in May and thinks she still has the mites, she has no visible rash that I can see and states she can feel and see them.

## 2012-02-06 ENCOUNTER — Emergency Department: Admission: EM | Admit: 2012-02-06 | Discharge: 2012-02-06 | Payer: Self-pay | Source: Home / Self Care

## 2012-02-09 ENCOUNTER — Emergency Department (HOSPITAL_COMMUNITY)
Admission: EM | Admit: 2012-02-09 | Discharge: 2012-02-09 | Disposition: A | Discharge status: Home / Self Care | Payer: Medicaid Other | Source: Home / Self Care | Attending: Family Medicine | Admitting: Family Medicine

## 2012-02-09 ENCOUNTER — Encounter (HOSPITAL_COMMUNITY): Payer: Self-pay | Admitting: *Deleted

## 2012-02-09 DIAGNOSIS — L981 Factitial dermatitis: Secondary | ICD-10-CM

## 2012-02-09 HISTORY — DX: Anxiety disorder, unspecified: F41.9

## 2012-02-09 MED ORDER — IVERMECTIN 3 MG PO TABS: ORAL_TABLET | ORAL | Status: DC

## 2012-02-09 MED ORDER — HYDROXYZINE HCL 50 MG PO TABS: 50.0000 mg | ORAL_TABLET | Freq: Four times a day (QID) | ORAL | Status: AC

## 2012-02-09 NOTE — Discharge Instructions (Signed)
Take medicine as prescribed and see your doctor if further problems °

## 2012-02-09 NOTE — ED Notes (Signed)
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

## 2012-02-09 NOTE — ED Provider Notes (Signed)
History     CSN: [id number]  Arrival date & time 02/09/12  1700   First MD Initiated Contact with Patient 02/09/12 1708      Chief Complaint  Patient presents with  . Pruritis    (Consider location/radiation/quality/duration/timing/severity/associated sxs/prior treatment) Patient is a 66 y.o. female presenting with rash.  Rash  This is a chronic problem. The current episode started more than 1 week ago (109mo h/o itching, has used elimite twice, but states sx recur.). The problem is associated with an insect bite/sting (pt feels she has norweigen scabies., cna feel them crawling ). There has been no fever. The rash is present on the scalp, torso, abdomen and back.    Past Medical History  Diagnosis Date  . GERD (gastroesophageal reflux disease)   . IBS (irritable bowel syndrome)   . Anxiety     Past Surgical History  Procedure Date  . Abdominal hysterectomy     History reviewed. No pertinent family history.  History  Substance Use Topics  . Smoking status: Never Smoker   . Smokeless tobacco: Not on file  . Alcohol Use: No    OB History    Grav Para Term Preterm Abortions TAB SAB Ect Mult Living                  Review of Systems  Constitutional: Negative.   Skin: Positive for rash.    Allergies  Aspirin; Meperidine hcl; Povidone; Amoxicillin; Betadine; and Penicillins  Home Medications   Current Outpatient Rx  Name Route Sig Dispense Refill  . AMITRIPTYLINE HCL 150 MG PO TABS Oral Take 150 mg by mouth at bedtime.      Marland Kitchen BISMUTH SUBSALICYLATE 262 MG PO CHEW Oral Chew 524 mg by mouth 3 (three) times daily as needed. Stomach pain     . FAMOTIDINE 40 MG PO TABS Oral Take 40 mg by mouth at bedtime.      Marland Kitchen LORAZEPAM 0.5 MG PO TABS Oral Take 0.5 mg by mouth 2 (two) times daily as needed. Anxiety and insomnia     . SIMETHICONE 125 MG PO CHEW Oral Chew 125 mg by mouth every 6 (six) hours as needed. For gas    . CALCIUM CARB-CHOLECALCIFEROL 500-400 MG-UNIT PO CHEW  Oral Chew 1 tablet by mouth daily.      Marland Kitchen HYDROXYZINE HCL 50 MG PO TABS Oral Take 1 tablet (50 mg total) by mouth every 6 (six) hours. As needed for itching 20 tablet 0  . IVERMECTIN 3 MG PO TABS  Take all tabs as single dose 5 tablet 0    BP 109/74  Pulse 85  Temp 97.6 F (36.4 C) (Oral)  Resp 16  SpO2 97%  Physical Exam  Nursing note and vitals reviewed. Constitutional: She appears well-developed and well-nourished.  Skin: Skin is warm and dry. No rash noted.       No rash or lesions present.    ED Course  Procedures (including critical care time)  Labs Reviewed - No data to display No results found.   1. Dermatitis artifacta       MDM          Linna Hoff, MD 02/09/12 [phone]

## 2012-02-25 ENCOUNTER — Other Ambulatory Visit: Payer: Self-pay | Admitting: Surgery

## 2012-02-27 ENCOUNTER — Emergency Department (HOSPITAL_COMMUNITY): Payer: Medicaid Other

## 2012-02-27 ENCOUNTER — Encounter (HOSPITAL_COMMUNITY): Payer: Self-pay

## 2012-02-27 ENCOUNTER — Emergency Department (HOSPITAL_COMMUNITY)
Admission: EM | Admit: 2012-02-27 | Discharge: 2012-02-27 | Disposition: A | Discharge status: Home / Self Care | Payer: Medicaid Other | Attending: Emergency Medicine | Admitting: Emergency Medicine

## 2012-02-27 DIAGNOSIS — F411 Generalized anxiety disorder: Secondary | ICD-10-CM | POA: Insufficient documentation

## 2012-02-27 DIAGNOSIS — G9332 Myalgic encephalomyelitis/chronic fatigue syndrome: Secondary | ICD-10-CM | POA: Insufficient documentation

## 2012-02-27 DIAGNOSIS — R1013 Epigastric pain: Secondary | ICD-10-CM | POA: Insufficient documentation

## 2012-02-27 DIAGNOSIS — R5382 Chronic fatigue, unspecified: Secondary | ICD-10-CM | POA: Insufficient documentation

## 2012-02-27 DIAGNOSIS — K219 Gastro-esophageal reflux disease without esophagitis: Secondary | ICD-10-CM | POA: Insufficient documentation

## 2012-02-27 DIAGNOSIS — K589 Irritable bowel syndrome without diarrhea: Secondary | ICD-10-CM | POA: Insufficient documentation

## 2012-02-27 DIAGNOSIS — R42 Dizziness and giddiness: Secondary | ICD-10-CM | POA: Insufficient documentation

## 2012-02-27 HISTORY — DX: Chronic fatigue, unspecified: R53.82

## 2012-02-27 HISTORY — DX: Myalgic encephalomyelitis/chronic fatigue syndrome: G93.32

## 2012-02-27 LAB — BASIC METABOLIC PANEL
BUN: 15 mg/dL (ref 6–23)
CO2: 28 mEq/L (ref 19–32)
Glucose, Bld: 107 mg/dL — ABNORMAL HIGH (ref 70–99)
Potassium: 3.8 mEq/L (ref 3.5–5.1)
Sodium: 141 mEq/L (ref 135–145)

## 2012-02-27 LAB — URINALYSIS, ROUTINE W REFLEX MICROSCOPIC
Bilirubin Urine: NEGATIVE
Glucose, UA: NEGATIVE mg/dL
Hgb urine dipstick: NEGATIVE
Specific Gravity, Urine: 1.013 (ref 1.005–1.030)
pH: 8 (ref 5.0–8.0)

## 2012-02-27 LAB — CBC WITH DIFFERENTIAL/PLATELET
Eosinophils Absolute: 0 10*3/uL (ref 0.0–0.7)
Eosinophils Relative: 1 % (ref 0–5)
Hemoglobin: 12.3 g/dL (ref 12.0–15.0)
Lymphocytes Relative: 38 % (ref 12–46)
Lymphs Abs: 2.4 10*3/uL (ref 0.7–4.0)
MCH: 31.1 pg (ref 26.0–34.0)
MCV: 92.9 fL (ref 78.0–100.0)
Monocytes Relative: 7 % (ref 3–12)
Neutrophils Relative %: 54 % (ref 43–77)
Platelets: 253 10*3/uL (ref 150–400)
RBC: 3.96 MIL/uL (ref 3.87–5.11)
WBC: 6.3 10*3/uL (ref 4.0–10.5)

## 2012-02-27 LAB — URINE MICROSCOPIC-ADD ON

## 2012-02-27 MED ORDER — MECLIZINE HCL 50 MG PO TABS: 25.0000 mg | ORAL_TABLET | Freq: Three times a day (TID) | ORAL | Status: AC | PRN

## 2012-02-27 NOTE — ED Notes (Signed)
Per EMS, pt from home c/o syncopal episode while lying in bed this morning after reaching for telephone. SR on monitor, also c/o substernal non-radiating chest pain off and on for 2 weeks now. Has a history of vertigo but was not given ongoing treatment for it. Also had recent treatment for scabies but doesn't feel like it is gone.  18g LAC with NS infusing KVO. VSS 122/57, 78 HR, 96%

## 2012-02-27 NOTE — ED Provider Notes (Signed)
History     CSN: [id number]  Arrival date & time 02/27/12  1221   First MD Initiated Contact with Patient 02/27/12 1529      Chief Complaint  Patient presents with  . Dizziness    pt told EMS she had a syncopal episode while in bed, received recent treatment for scabies but doesn't feel like it got better.     (Consider location/radiation/quality/duration/timing/severity/associated sxs/prior treatment) The history is provided by the patient.   66 y/o female INAD resting comfortable c/o verigo when waking this AM. Pt reports it was so severe that she couldn't stand to use the restroom after waking. Eventually she made it to the restroom and back in the bed. She reports waking at 9 AM when the phone rand and then after using the restroom the same person called her back at 10 AM and she thought it was only a few minutes ago. There fore she believes that she had syncope. Episodes of vertigo are exacerbated by standing. Episodes last for however long she is standing. She has greatly improved since onset and can now walk with only mild vertigo. Pt has had several episodes of vertigo in the past but they have always been accompanied by nausea. Pt also reports a pleuritic sternal chest pain x2 weeks. Denies nausea, vomiting, focal weakness, tinnitus, fever, HA, Neck pain. .   Past Medical History  Diagnosis Date  . GERD (gastroesophageal reflux disease)   . IBS (irritable bowel syndrome)   . Anxiety   . Chronic fatigue syndrome     Past Surgical History  Procedure Date  . Abdominal hysterectomy   . Bladder suspension     No family history on file.  History  Substance Use Topics  . Smoking status: Never Smoker   . Smokeless tobacco: Not on file  . Alcohol Use: No    OB History    Grav Para Term Preterm Abortions TAB SAB Ect Mult Living                  Review of Systems  Constitutional: Negative.  Negative for fever.  HENT: Negative for hearing loss, ear pain, congestion,  neck stiffness and tinnitus.   Respiratory: Negative for shortness of breath.   Cardiovascular:       Intermittent fleeting sternal chest pain  Neurological: Positive for dizziness and syncope. Negative for tremors, speech difficulty, weakness, numbness and headaches.  All other systems reviewed and are negative.    Allergies  Aspirin; Meperidine hcl; Milk-related compounds; Povidone; Amoxicillin; Betadine; and Penicillins  Home Medications   Current Outpatient Rx  Name Route Sig Dispense Refill  . ACETAMINOPHEN 500 MG PO TABS Oral Take 1,000 mg by mouth every 6 (six) hours as needed. For pain    . AMITRIPTYLINE HCL 150 MG PO TABS Oral Take 150 mg by mouth at bedtime.      Marland Kitchen BISMUTH SUBSALICYLATE 262 MG PO CHEW Oral Chew 524 mg by mouth 3 (three) times daily as needed. Stomach pain    . FAMOTIDINE 40 MG PO TABS Oral Take 40 mg by mouth at bedtime.     Marland Kitchen LORAZEPAM 0.5 MG PO TABS Oral Take 0.5 mg by mouth 2 (two) times daily as needed. Anxiety and insomnia    . SIMETHICONE 125 MG PO CHEW Oral Chew 125 mg by mouth every 6 (six) hours as needed. For gas      BP 114/70  Pulse 74  Temp 97.8 F (36.6 C) (Oral)  Resp  17  SpO2 100%  Physical Exam  Vitals reviewed. Constitutional: She is oriented to person, place, and time. She appears well-developed and well-nourished. No distress.  HENT:  Head: Normocephalic and atraumatic.  Right Ear: External ear normal.  Left Ear: External ear normal.  Nose: Nose normal.  Mouth/Throat: Oropharynx is clear and moist.       No signes of trauma  Eyes: Conjunctivae and EOM are normal. Pupils are equal, round, and reactive to light.  Neck: Normal range of motion. Neck supple. No thyromegaly present.  Cardiovascular: Normal rate, regular rhythm and normal heart sounds.   Pulmonary/Chest: Effort normal and breath sounds normal.  Abdominal: Soft. Bowel sounds are normal. She exhibits no distension and no mass. There is no tenderness. There is no  rebound and no guarding.  Musculoskeletal: Normal range of motion.  Neurological: She is alert and oriented to person, place, and time. She displays normal reflexes. No cranial nerve deficit.       CN 3-12 intact. Finger to nose normal. Romberg normal. Dix Hallpike negative.    Psychiatric: She has a normal mood and affect.    ED Course  Procedures (including critical care time)  Labs Reviewed  BASIC METABOLIC PANEL - Abnormal; Notable for the following:    Glucose, Bld 107 (*)     GFR calc non Af Amer 77 (*)     GFR calc Af Amer 89 (*)     All other components within normal limits  URINALYSIS, ROUTINE W REFLEX MICROSCOPIC - Abnormal; Notable for the following:    Leukocytes, UA SMALL (*)     All other components within normal limits  CBC WITH DIFFERENTIAL  URINE MICROSCOPIC-ADD ON   Dg Chest 2 View  02/27/2012  *RADIOLOGY REPORT*  Clinical Data: Epigastric pain.  CHEST - 2 VIEW  Comparison: 08/23/2011  Findings: The lungs are clear without focal infiltrate, edema, pneumothorax or pleural effusion. The cardiopericardial silhouette is within normal limits for size. Imaged bony structures of the thorax are intact.  IMPRESSION: Stable.  No acute findings.  Original Report Authenticated By: ERIC A. MANSELL, M.D.     1. Vertigo       MDM  66 y/o female iNAD c/o vertigo improving since onset this MA.  Pt thinks she may have synopsized. No signs of trauma, Neuro exam normal Dix  Hallpike negative. Patient is rapidly improving symptoms only a trace levels. Lab work and chest x-ray normal. I will d/c patient with Antivert and strict return precautions.  Discussed case with attending who agrees with plan and stability to d/c to home.  Pt verbalized understanding and agrees with care plan. Outpatient follow-up and return precautions given.              Wynetta Emery, PA-C 02/27/12 1732

## 2012-02-27 NOTE — ED Notes (Signed)
PA at bedside.

## 2012-02-28 NOTE — ED Provider Notes (Signed)
Medical screening examination/treatment/procedure(s) were performed by non-physician practitioner and as supervising physician I was immediately available for consultation/collaboration.  Yanixan Mellinger, MD 02/28/12 0904 

## 2012-06-02 ENCOUNTER — Telehealth: Payer: Self-pay | Admitting: Gastroenterology

## 2012-06-02 NOTE — Telephone Encounter (Signed)
Left message for pt to call back.  Pt states she used to take Famotidine for reflux and abdominal discomfort. She changed and started taking Zantac and this helped for a while. Pt states that now her stomach just hurts and aches like a tooth ache. Reports she has some acid in her throat also. Requesting to be seen. Pt scheduled to see Mike Gip PA tomorrow at 3:30pm. Pt aware of appt date and time.

## 2012-06-03 ENCOUNTER — Encounter: Payer: Self-pay | Admitting: Physician Assistant

## 2012-06-03 ENCOUNTER — Other Ambulatory Visit (INDEPENDENT_AMBULATORY_CARE_PROVIDER_SITE_OTHER): Payer: Medicaid Other

## 2012-06-03 ENCOUNTER — Ambulatory Visit (INDEPENDENT_AMBULATORY_CARE_PROVIDER_SITE_OTHER): Payer: Medicaid Other | Admitting: Physician Assistant

## 2012-06-03 VITALS — BP 118/64 | HR 72 | Ht 63.75 in | Wt 140.8 lb

## 2012-06-03 DIAGNOSIS — R141 Gas pain: Secondary | ICD-10-CM

## 2012-06-03 DIAGNOSIS — R140 Abdominal distension (gaseous): Secondary | ICD-10-CM

## 2012-06-03 DIAGNOSIS — R143 Flatulence: Secondary | ICD-10-CM

## 2012-06-03 DIAGNOSIS — R109 Unspecified abdominal pain: Secondary | ICD-10-CM

## 2012-06-03 LAB — COMPREHENSIVE METABOLIC PANEL
Albumin: 3.7 g/dL (ref 3.5–5.2)
Alkaline Phosphatase: 55 U/L (ref 39–117)
Calcium: 9.2 mg/dL (ref 8.4–10.5)
Chloride: 105 mEq/L (ref 96–112)
Glucose, Bld: 98 mg/dL (ref 70–99)
Potassium: 4 mEq/L (ref 3.5–5.1)
Sodium: 139 mEq/L (ref 135–145)
Total Protein: 7.1 g/dL (ref 6.0–8.3)

## 2012-06-03 LAB — CBC WITH DIFFERENTIAL/PLATELET
Eosinophils Relative: 1.3 % (ref 0.0–5.0)
Lymphocytes Relative: 40.1 % (ref 12.0–46.0)
MCV: 95.6 fl (ref 78.0–100.0)
Monocytes Absolute: 0.5 10*3/uL (ref 0.1–1.0)
Monocytes Relative: 7.5 % (ref 3.0–12.0)
Neutrophils Relative %: 50.7 % (ref 43.0–77.0)
Platelets: 283 10*3/uL (ref 150.0–400.0)
RBC: 3.75 Mil/uL — ABNORMAL LOW (ref 3.87–5.11)
WBC: 6.9 10*3/uL (ref 4.5–10.5)

## 2012-06-03 MED ORDER — DEXLANSOPRAZOLE 60 MG PO CPDR: 60.0000 mg | DELAYED_RELEASE_CAPSULE | Freq: Every day | ORAL | Status: DC

## 2012-06-03 MED ORDER — POLYETHYLENE GLYCOL 3350 17 GM/SCOOP PO POWD: 17.0000 g | ORAL | Status: DC | PRN

## 2012-06-03 NOTE — Patient Instructions (Addendum)
Please purchase Miralax over the counter and take as needed   Please stop Pepcid and begin Dexilant one by mouth 30 minutes before breakfast. We have given you samples of Dexilant if this works we can call in a prescription   Your physician has requested that you go to the basement for lab work before leaving today    ______________________________________________________________________________________________________________________   Shelley [name] have been scheduled for a CT scan of the abdomen and pelvis at [facility] ([address]---this is in the same building as Architectural technologist).   You are scheduled on 07-06-2012 at 2pm. You should arrive 15 minutes prior to your appointment time for registration. Please follow the written instructions below on the day of your exam:  WARNING: IF YOU ARE ALLERGIC TO IODINE/X-RAY DYE, PLEASE NOTIFY RADIOLOGY IMMEDIATELY AT [phone]! YOU WILL BE GIVEN A 13 HOUR PREMEDICATION PREP.  1) Do not eat or drink anything after 9am (4 hours prior to your test) 2) You have been given 2 bottles of oral contrast to drink. The solution may taste better if refrigerated, but do NOT add ice or any other liquid to this solution. Shake well before drinking.    Drink 1 bottle of contrast @ 12pm (2 hours prior to your exam)  Drink 1 bottle of contrast @ 1pm (1 hour prior to your exam)  You may take any medications as prescribed with a small amount of water except for the following: Metformin, Glucophage, Glucovance, Avandamet, Riomet, Fortamet, Actoplus Met, Janumet, Glumetza or Metaglip. The above medications must be held the day of the exam AND 48 hours after the exam.  The purpose of you drinking the oral contrast is to aid in the visualization of your intestinal tract. The contrast solution may cause some diarrhea. Before your exam is started, you will be given a small amount of fluid to drink. Depending on your individual set of symptoms, you may also  receive an intravenous injection of x-ray contrast/dye. Plan on being at [facility] for 30 minutes or long, depending on the type of exam you are having performed.  If you have any questions regarding your exam or if you need to reschedule, you may call the CT department at [phone] between the hours of 8:00 am and 5:00 pm, Monday-Friday.  ________________________________________________________________________

## 2012-06-04 ENCOUNTER — Encounter: Payer: Self-pay | Admitting: Physician Assistant

## 2012-06-04 NOTE — Progress Notes (Signed)
Subjective:    Patient ID: Shelley [name], female    DOB: September 20, 1945, 66 y.o.   MRN: [id number]  HPI Shelley [name] is a 66 year old female known previously to after Arlyce Dice with history of GERD and constipation. She last underwent colonoscopy in 2011 this was negative exam with the exception of moderate diverticulosis. Last EGD was done in February of 2011 also and was normal. She had gastritis on endoscopy in 2007 and was H. pyloric positive. Patient relates that she underwent a bladder suspension and total abdominal hysterectomy in 2011 which was complicated by an injury to her bladder. Apparently she had a second surgery done at [facility] in February of 2012 with repair of the bladder and removal of a portion of mesh which had been placed with the original surgery. She was told at that time that she had a rectocele as well. She comes in today with complaints of increased heartburn and indigestion over the past few months and two-week history of mid abdominal and upper abdominal pain which she describes as aching and "toothache-like" she says this is been present constantly and seems to be more uncomfortable at night and is disturbing her sleep. She has also noted a" knot" intermittently in her right lower quadrant. She has had some vague nausea and feels that within one to 2 hours after meals her symptoms seem to be worse. She complains of feeling bloated and distended she's not had any vomiting. She says she said going 2 or 3 days between bowel movements and has difficulty evacuating her stools most of the time. She says she gets some discomfort in her lower abdomen with the bowel movement and often will pass just a small amount of stool in the time usually with urinating. She has been using Pepcid for her reflux which is not working well. She states she had taken Nexium in the past but did not tolerate due to nausea   Review of Systems  Constitutional: Negative.   HENT: Negative.   Eyes: Negative.    Respiratory: Negative.   Cardiovascular: Negative.   Gastrointestinal: Positive for abdominal pain, constipation and abdominal distention.  Genitourinary: Negative.   Musculoskeletal: Positive for back pain.  Neurological: Negative.   Hematological: Negative.   Psychiatric/Behavioral: Negative.    Outpatient Encounter Prescriptions as of 06/03/2012  Medication Sig Dispense Refill  . acetaminophen (TYLENOL) 500 MG tablet Take 1,000 mg by mouth every 6 (six) hours as needed. For pain      . amitriptyline (ELAVIL) 150 MG tablet Take 150 mg by mouth at bedtime.        . bismuth subsalicylate (PEPTO BISMOL) 262 MG chewable tablet Chew 524 mg by mouth 3 (three) times daily as needed. Stomach pain      . LORazepam (ATIVAN) 0.5 MG tablet Take 0.5 mg by mouth 2 (two) times daily as needed. Anxiety and insomnia      . simethicone (MYLICON) 125 MG chewable tablet Chew 125 mg by mouth every 6 (six) hours as needed. For gas      . DISCONTD: famotidine (PEPCID) 40 MG tablet Take 40 mg by mouth at bedtime.       Marland Kitchen dexlansoprazole (DEXILANT) 60 MG capsule Take 1 capsule (60 mg total) by mouth daily.  15 capsule  0  . polyethylene glycol powder (GLYCOLAX/MIRALAX) powder Take 17 g by mouth as needed.           Allergies  Allergen Reactions  . Aspirin Nausea And Vomiting  . Meperidine Hcl  Burning and tingling  . Milk-Related Compounds Nausea And Vomiting  . Povidone Itching    Burning and itching  . Amoxicillin Itching and Rash  . Betadine (Povidone Iodine) Rash    Bumps, burning  . Penicillins Itching and Rash   Patient Active Problem List  Diagnosis  . THYROID STIMULATING HORMONE, ABNORMAL  . UNSPECIFIED VITAMIN D DEFICIENCY  . DEPRESSION, MAJOR, RECURRENT  . ANXIETY  . HEMORRHOIDS, NOS  . GASTROESOPHAGEAL REFLUX, NO ESOPHAGITIS  . GASTRITIS  . DIVERTICULITIS OF COLON, NOS  . IRRITABLE BOWEL SYNDROME  . BLADDER PROLAPSE  . 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  OSTEOARTHROSIS NOS, HAND  . FATIGUE  . SYMPTOM, ENLARGEMENT, LYMPH NODES  . CHANGE IN BOWELS  . DYSURIA  . INCOMPLETE VOIDING  . LUQ PAIN  . RLQ PAIN   History   Social History  . Marital Status: Divorced    Spouse Name: N/A    Number of Children: 2  . Years of Education: N/A   Occupational History  . Disabled    Social History Main Topics  . Smoking status: Never Smoker   . Smokeless tobacco: Not on file  . Alcohol Use: No  . Drug Use: No  . Sexually Active:    Other Topics Concern  . Not on file   Social History Narrative  . No narrative on file    Objective:   Physical Exam Well-developed white female in no acute distress, pleasant blood pressure 118/64 pulse 72 height 5 foot 3 weight 140. HEENT; nontraumatic normocephalic EOMI PERRLA sclera anicteric, Neck ;supple no JVD, cardiovascular; regular rate and rhythm with S1-S2 no murmur or gallop, Pulmonary; clear bilaterally, Abdomen; soft, she is tender in the mid abdomen and peri-umbilical area, also mildly in the right lower quadrant is no palpable mass or palpable hernia no guarding or rebound bowel sounds are active she does have incisional scar, Rectal; exam not done, Psych; mood and affect normal and appropriate, Extremities; no clubbing cyanosis or edema skin warm dry.       Assessment & Plan:  #76 66 year old female with chronic GERD, currently poorly controlled with famotidine #2 two-week history of mid abdominal pain bloating distention and increased reflux. Patient has had multiple abdominal surgeries will need to rule out partial low-grade small bowel obstruction or other intra-abdominal inflammatory process #3 chronic constipation and difficulty with bowel evacuation which is very likely due to pelvic floor dysfunction and her prior pelvic surgeries. Apparently also has had previously documented rectocele. #4 anxiety #5 diverticulosis #6 chronic fatigue  Plan; Will start a trial of Dexilant 60 mg by mouth  every morning; samples were given. This will be in place of her famotidine Schedule for CT scan of the abdomen and pelvis Start MiraLax 17 g in 8 ounces of water daily or every other day patient to adjust CBC and be met today Further plans pending results of CT.

## 2012-06-04 NOTE — Progress Notes (Signed)
Reviewed and agree with management. Asencion Guisinger D. Yassmine Tamm, M.D., FACG  

## 2012-06-05 ENCOUNTER — Ambulatory Visit (INDEPENDENT_AMBULATORY_CARE_PROVIDER_SITE_OTHER)
Admission: RE | Admit: 2012-06-05 | Discharge: 2012-06-05 | Disposition: A | Discharge status: Home / Self Care | Payer: Medicaid Other | Source: Ambulatory Visit | Attending: Physician Assistant | Admitting: Physician Assistant

## 2012-06-05 DIAGNOSIS — R142 Eructation: Secondary | ICD-10-CM

## 2012-06-05 DIAGNOSIS — R109 Unspecified abdominal pain: Secondary | ICD-10-CM

## 2012-06-05 DIAGNOSIS — R140 Abdominal distension (gaseous): Secondary | ICD-10-CM

## 2012-06-05 DIAGNOSIS — R141 Gas pain: Secondary | ICD-10-CM

## 2012-06-05 MED ORDER — IOHEXOL 300 MG/ML  SOLN
100.0000 mL | Freq: Once | INTRAMUSCULAR | Status: AC | PRN
  Administered 2012-06-05: 100 mL via INTRAVENOUS

## 2013-01-01 ENCOUNTER — Telehealth: Payer: Self-pay | Admitting: Gastroenterology

## 2013-01-01 NOTE — Telephone Encounter (Signed)
Left message for pt to call back  °

## 2013-01-06 NOTE — Telephone Encounter (Signed)
Left message for pt to call back  °

## 2013-01-07 NOTE — Telephone Encounter (Signed)
Left message for pt to call back.  Unable to reach pt after multiple attempts.

## 2013-08-03 ENCOUNTER — Encounter: Payer: Self-pay | Admitting: *Deleted

## 2013-08-05 ENCOUNTER — Encounter: Payer: Self-pay | Admitting: Gastroenterology

## 2014-10-22 ENCOUNTER — Encounter: Payer: Self-pay | Admitting: *Deleted

## 2014-10-22 ENCOUNTER — Ambulatory Visit: Payer: Medicaid Other | Admitting: Physician Assistant

## 2014-10-22 NOTE — Progress Notes (Unsigned)
Patient ID: Shelley CanalBonnie D Densmore, female   DOB: 03/15/1946, 69 y.o.   MRN: [id number] I called [facility] office and advised Amy there that this patient did not show for her appointment with Mike GipAmy Esterwood PA-C here at [facility] today, 10-22-2014. She said she would advise the referral cooridinator at their office today.

## 2015-01-03 ENCOUNTER — Encounter (HOSPITAL_COMMUNITY): Payer: Self-pay | Admitting: *Deleted

## 2015-01-04 ENCOUNTER — Other Ambulatory Visit: Payer: Self-pay | Admitting: Gastroenterology

## 2015-01-04 NOTE — Addendum Note (Signed)
Addended by: Willis ModenaUTLAW, Ileene Allie on: 01/04/2015 05:28 PM   Modules accepted: Orders

## 2015-01-05 ENCOUNTER — Ambulatory Visit (HOSPITAL_COMMUNITY): Payer: Medicare Other | Admitting: Anesthesiology

## 2015-01-05 ENCOUNTER — Ambulatory Visit (HOSPITAL_COMMUNITY)
Admission: RE | Admit: 2015-01-05 | Discharge: 2015-01-05 | Disposition: A | Discharge status: Home / Self Care | Payer: Medicare Other | Source: Ambulatory Visit | Attending: Gastroenterology | Admitting: Gastroenterology

## 2015-01-05 ENCOUNTER — Encounter (HOSPITAL_COMMUNITY): Payer: Self-pay | Admitting: *Deleted

## 2015-01-05 ENCOUNTER — Encounter (HOSPITAL_COMMUNITY)
Admission: RE | Disposition: A | Discharge status: Home / Self Care | Payer: Self-pay | Source: Ambulatory Visit | Attending: Gastroenterology

## 2015-01-05 DIAGNOSIS — K644 Residual hemorrhoidal skin tags: Secondary | ICD-10-CM | POA: Diagnosis not present

## 2015-01-05 DIAGNOSIS — F418 Other specified anxiety disorders: Secondary | ICD-10-CM | POA: Diagnosis not present

## 2015-01-05 DIAGNOSIS — K5730 Diverticulosis of large intestine without perforation or abscess without bleeding: Secondary | ICD-10-CM | POA: Diagnosis not present

## 2015-01-05 DIAGNOSIS — B9681 Helicobacter pylori [H. pylori] as the cause of diseases classified elsewhere: Secondary | ICD-10-CM | POA: Insufficient documentation

## 2015-01-05 DIAGNOSIS — K2950 Unspecified chronic gastritis without bleeding: Secondary | ICD-10-CM | POA: Insufficient documentation

## 2015-01-05 DIAGNOSIS — Z8371 Family history of colonic polyps: Secondary | ICD-10-CM | POA: Insufficient documentation

## 2015-01-05 DIAGNOSIS — K921 Melena: Secondary | ICD-10-CM | POA: Insufficient documentation

## 2015-01-05 DIAGNOSIS — K635 Polyp of colon: Secondary | ICD-10-CM | POA: Insufficient documentation

## 2015-01-05 DIAGNOSIS — F329 Major depressive disorder, single episode, unspecified: Secondary | ICD-10-CM | POA: Insufficient documentation

## 2015-01-05 HISTORY — DX: Major depressive disorder, single episode, unspecified: F32.9

## 2015-01-05 HISTORY — DX: Depression, unspecified: F32.A

## 2015-01-05 HISTORY — DX: Other complications of anesthesia, initial encounter: T88.59XA

## 2015-01-05 HISTORY — PX: COLONOSCOPY WITH PROPOFOL: SHX5780

## 2015-01-05 HISTORY — DX: Personal history of other diseases of the digestive system: Z87.19

## 2015-01-05 HISTORY — DX: Adverse effect of unspecified anesthetic, initial encounter: T41.45XA

## 2015-01-05 HISTORY — DX: Unspecified osteoarthritis, unspecified site: M19.90

## 2015-01-05 HISTORY — PX: ESOPHAGOGASTRODUODENOSCOPY (EGD) WITH PROPOFOL: SHX5813

## 2015-01-05 HISTORY — DX: Unspecified convulsions: R56.9

## 2015-01-05 SURGERY — ESOPHAGOGASTRODUODENOSCOPY (EGD) WITH PROPOFOL
Anesthesia: Monitor Anesthesia Care

## 2015-01-05 MED ORDER — PROPOFOL 10 MG/ML IV BOLUS
INTRAVENOUS | Status: AC
  Filled 2015-01-05: qty 20

## 2015-01-05 MED ORDER — BUTAMBEN-TETRACAINE-BENZOCAINE 2-2-14 % EX AERO
INHALATION_SPRAY | CUTANEOUS | Status: DC | PRN
  Administered 2015-01-05: 2 via TOPICAL

## 2015-01-05 MED ORDER — LACTATED RINGERS IV SOLN
INTRAVENOUS | Status: DC
  Administered 2015-01-05: 1000 mL via INTRAVENOUS

## 2015-01-05 MED ORDER — SODIUM CHLORIDE 0.9 % IV SOLN: INTRAVENOUS | Status: DC

## 2015-01-05 MED ORDER — PROPOFOL 10 MG/ML IV BOLUS
INTRAVENOUS | Status: DC | PRN
  Administered 2015-01-05: 20 mg via INTRAVENOUS
  Administered 2015-01-05: 10 mg via INTRAVENOUS
  Administered 2015-01-05: 20 mg via INTRAVENOUS
  Administered 2015-01-05 (×3): 10 mg via INTRAVENOUS
  Administered 2015-01-05: 20 mg via INTRAVENOUS
  Administered 2015-01-05: 10 mg via INTRAVENOUS
  Administered 2015-01-05 (×5): 20 mg via INTRAVENOUS
  Administered 2015-01-05 (×5): 10 mg via INTRAVENOUS
  Administered 2015-01-05 (×2): 20 mg via INTRAVENOUS
  Administered 2015-01-05 (×3): 10 mg via INTRAVENOUS
  Administered 2015-01-05: 20 mg via INTRAVENOUS

## 2015-01-05 SURGICAL SUPPLY — 25 items

## 2015-01-05 NOTE — Discharge Instructions (Signed)
Endoscopy Care After Please read the instructions outlined below and refer to this sheet in the next few weeks. These discharge instructions provide you with general information on caring for yourself after you leave the hospital. Your doctor may also give you specific instructions. While your treatment has been planned according to the most current medical practices available, unavoidable complications occasionally occur. If you have any problems or questions after discharge, please call Dr. Cheresa Siers ([facility]) at [phone].  HOME CARE INSTRUCTIONS Activity You may resume your regular activity but move at a slower pace for the next 24 hours.  Take frequent rest periods for the next 24 hours.  Walking will help expel (get rid of) the air and reduce the bloated feeling in your abdomen.  No driving for 24 hours (because of the anesthesia (medicine) used during the test).  You may shower.  Do not sign any important legal documents or operate any machinery for 24 hours (because of the anesthesia used during the test).  Nutrition Drink plenty of fluids.  You may resume your normal diet.  Begin with a light meal and progress to your normal diet.  Avoid alcoholic beverages for 24 hours or as instructed by your caregiver.  Medications You may resume your normal medications unless your caregiver tells you otherwise. What you can expect today You may experience abdominal discomfort such as a feeling of fullness or "gas" pains.  You may experience a sore throat for 2 to 3 days. This is normal. Gargling with salt water may help this.   SEEK IMMEDIATE MEDICAL CARE IF: You have excessive nausea (feeling sick to your stomach) and/or vomiting.  You have severe abdominal pain and distention (swelling).  You have trouble swallowing.  You have a temperature over 100 F (37.8 C).  You have rectal bleeding or vomiting of blood.  Document Released: 03/20/2004 Document Revised: 04/18/2011  Document Reviewed: 10/01/2007 ExitCare Patient Information 2012 [employer].   Colonoscopy  Post procedure instructions:  Read the instructions outlined below and refer to this sheet in the next few weeks. These discharge instructions provide you with general information on caring for yourself after you leave the hospital. Your doctor may also give you specific instructions. While your treatment has been planned according to the most current medical practices available, unavoidable complications occasionally occur. If you have any problems or questions after discharge, call Dr. Margurette Brener at [facility] ([phone]).  HOME CARE INSTRUCTIONS  ACTIVITY: You may resume your regular activity, but move at a slower pace for the next 24 hours.  Take frequent rest periods for the next 24 hours.  Walking will help get rid of the air and reduce the bloated feeling in your belly (abdomen).  No driving for 24 hours (because of the medicine (anesthesia) used during the test).  You may shower.  Do not sign any important legal documents or operate any machinery for 24 hours (because of the anesthesia used during the test).  NUTRITION: Drink plenty of fluids.  You may resume your normal diet as instructed by your doctor.  Begin with a light meal and progress to your normal diet. Heavy or fried foods are harder to digest and may make you feel sick to your stomach (nauseated).  Avoid alcoholic beverages for 24 hours or as instructed.  MEDICATIONS: You may resume your normal medications unless your doctor tells you otherwise.  WHAT TO EXPECT TODAY: Some feelings of bloating in the abdomen.  Passage of more gas than usual.  Spotting   of blood in your stool or on the toilet paper.  IF YOU HAD POLYPS REMOVED DURING THE COLONOSCOPY: No aspirin products for 7 days or as instructed.  No alcohol for 7 days or as instructed.  Eat a soft diet for the next 24 hours.   FINDING OUT THE RESULTS OF YOUR  TEST  Not all test results are available during your visit. If your test results are not back during the visit, make an appointment with your caregiver to find out the results. Do not assume everything is normal if you have not heard from your caregiver or the medical facility. It is important for you to follow up on all of your test results.     SEEK IMMEDIATE MEDICAL CARE IF:  You have more than a spotting of blood in your stool.  Your belly is swollen (abdominal distention).  You are nauseated or vomiting.  You have a fever.  You have abdominal pain or discomfort that is severe or gets worse throughout the day.    Document Released: 03/20/2004 Document Revised: 04/18/2011 Document Reviewed: 03/18/2008 ExitCare Patient Information 2012 [employer].  

## 2015-01-05 NOTE — Op Note (Addendum)
[facility] [address] [place], [id number]   COLONOSCOPY PROCEDURE REPORT  PATIENT: Shelley [name], Shelley [name]  MR#: [id number] BIRTHDATE: 06/27/1946 , 68  yrs. old GENDER: female ENDOSCOPIST: Willis ModenaWilliam Nathalia Wismer, MD REFERRED BY: PROCEDURE DATE:  01/05/2015 PROCEDURE:   Colonoscopy with biopsy and Colonoscopy with snare polypectomy ASA CLASS:   Class II INDICATIONS:blood in stool, family history colonic polyps. MEDICATIONS: Per Anesthesia  DESCRIPTION OF PROCEDURE:   After the risks benefits and alternatives of the procedure were thoroughly explained, informed consent was obtained.  revealed external hemorrhoids.   The endoscope was introduced through the anus and advanced to the cecum, which was identified by both the appendix and ileocecal valve. No adverse events experienced.   The quality of the prep was good.  The instrument was then slowly withdrawn as the colon was fully examined. Estimated blood loss is zero unless otherwise noted in this procedure report.    Findings:  External hemorrhoids, otherwise normal digital rectal examination.  Prep quality was good.  Few scattered diverticula seen throughout the colon, most in the left-colon.  5mm ascending polyp, removed cold snare.  2mm ascending and 3mm transverse polyps, removed cold biopsy forceps.  No other polyps, masses, vascular ectasias, or inflammatory changes were seen.  Retroflexed view of rectum was normal.           Withdrawal time was about 10 minutes .  The scope was withdrawn and the procedure completed.  COMPLICATIONS: None  ENDOSCOPIC IMPRESSION:     Hemorrhoids likely cause of bleeding. Small polyps removed.  Few scattered left-predominant diverticula.  RECOMMENDATIONS:     1.  Watch for potential complications of procedure. 2.  Await polypectomy results. 3.  Topical therapies (e.g., Preparation-H, Anusol-HC) as needed for hemorrhoidal bleeding. 4.  High fiber diet indefinitely. 5.  Repeat  colonoscopy in 5 years. 6.  Follow-up in office in 3 months.  eSigned:  Willis ModenaWilliam Vickey Boak, MD 01/05/2015 9:43 AM   cc:  CPT CODES: ICD CODES:  The ICD and CPT codes recommended by this software are interpretations from the data that the clinical staff has captured with the software.  The verification of the translation of this report to the ICD and CPT codes and modifiers is the sole responsibility of the health care institution and practicing physician where this report was generated.  [employer] will not be held responsible for the validity of the ICD and CPT codes included on this report.  AMA assumes no liability for data contained or not contained herein. CPT is a Publishing rights managerregistered trademark of the [employer].

## 2015-01-05 NOTE — Transfer of Care (Signed)
Immediate Anesthesia Transfer of Care Note  Patient: Shelley [name]  Procedure(s) Performed: Procedure(s): ESOPHAGOGASTRODUODENOSCOPY (EGD) WITH PROPOFOL (N/A) COLONOSCOPY WITH PROPOFOL (N/A)  Patient Location: PACU  Anesthesia Type:MAC  Level of Consciousness: sedated  Airway & Oxygen Therapy: O2 via nasal cannula  Post-op Assessment: Report given to RN and Post -op Vital signs reviewed and stable  Post vital signs: Reviewed and stable  Last Vitals:  Filed Vitals:   01/05/15 0809  BP: 125/70  Pulse: 83  Temp: 36.6 C  Resp: 12    Complications: No apparent anesthesia complications

## 2015-01-05 NOTE — H&P (Signed)
Patient interval history reviewed.  Patient examined again.  There has been no change from documented H/P dated 11/30/14 (scanned into chart from our office) except as documented above.  Assessment:  1.  Epigastric abdominal pain. 2.  History H. Pylori. 3.  Blood in stool. 4.  Family history of colonic polyps (mother).  Plan:  1.  Endoscopy. 2.  Risks (bleeding, infection, bowel perforation that could require surgery, sedation-related changes in cardiopulmonary systems), benefits (identification and possible treatment of source of symptoms, exclusion of certain causes of symptoms), and alternatives (watchful waiting, radiographic imaging studies, empiric medical treatment) of upper endoscopy (EGD) were explained to patient/family in detail and patient wishes to proceed. 3.  Colonoscopy. 4.  Risks (bleeding, infection, bowel perforation that could require surgery, sedation-related changes in cardiopulmonary systems), benefits (identification and possible treatment of source of symptoms, exclusion of certain causes of symptoms), and alternatives (watchful waiting, radiographic imaging studies, empiric medical treatment) of colonoscopy were explained to patient/family in detail and patient wishes to proceed.

## 2015-01-05 NOTE — Anesthesia Preprocedure Evaluation (Addendum)
Anesthesia Evaluation  Patient identified by MRN, date of birth, ID band Patient awake    Reviewed: Allergy & Precautions, NPO status , Patient's Chart, lab work & pertinent test results  Airway Mallampati: II  TM Distance: >3 FB Neck ROM: Full    Dental no notable dental hx.    Pulmonary neg pulmonary ROS,  breath sounds clear to auscultation  Pulmonary exam normal       Cardiovascular negative cardio ROS Normal cardiovascular examRhythm:Regular Rate:Normal     Neuro/Psych Seizures -,  PSYCHIATRIC DISORDERS Anxiety Depression    GI/Hepatic Neg liver ROS, hiatal hernia, GERD-  ,  Endo/Other  negative endocrine ROS  Renal/GU negative Renal ROS  negative genitourinary   Musculoskeletal  (+) Arthritis -,   Abdominal   Peds  Hematology negative hematology ROS (+)   Anesthesia Other Findings   Reproductive/Obstetrics negative OB ROS                             Anesthesia Physical Anesthesia Plan  ASA: II  Anesthesia Plan: MAC   Post-op Pain Management:    Induction: Intravenous  Airway Management Planned:   Additional Equipment: None  Intra-op Plan:   Post-operative Plan:   Informed Consent: I have reviewed the patients History and Physical, chart, labs and discussed the procedure including the risks, benefits and alternatives for the proposed anesthesia with the patient or authorized representative who has indicated his/her understanding and acceptance.   Dental advisory given  Plan Discussed with: CRNA  Anesthesia Plan Comments:         Anesthesia Quick Evaluation

## 2015-01-05 NOTE — Anesthesia Postprocedure Evaluation (Signed)
Anesthesia Post Note  Patient: Richardean CanalBonnie D Murdock  Procedure(s) Performed: Procedure(s) (LRB): ESOPHAGOGASTRODUODENOSCOPY (EGD) WITH PROPOFOL (N/A) COLONOSCOPY WITH PROPOFOL (N/A)  Anesthesia type: MAC  Patient location: PACU  Post pain: Pain level controlled  Post assessment: Post-op Vital signs reviewed  Last Vitals: BP 132/72 mmHg  Pulse 70  Temp(Src) 36.5 C (Oral)  Resp 10  Ht 5\' 4"  (1.626 m)  Wt 140 lb (63.504 kg)  BMI 24.02 kg/m2  SpO2 100%  Post vital signs: Reviewed  Level of consciousness: awake  Complications: No apparent anesthesia complications

## 2015-01-05 NOTE — Op Note (Signed)
[facility] [address] [place], [id number]   ENDOSCOPY PROCEDURE REPORT  PATIENT: Shelley [name], [name]  MR#: [id number] BIRTHDATE: October 10, 1945 , 68  yrs. old GENDER: female ENDOSCOPIST: Willis ModenaWilliam Abdirahim Flavell, MD REFERRED BY:  Virl Sonammy Boyd, MD PROCEDURE DATE:  01/05/2015 PROCEDURE:  Esophagogastroduodenoscopy with biopsy ASA CLASS:     ASA-II INDICATIONS:  Epigastric abdominal pain, history of H. pylori MEDICATIONS: Per Anesthesia TOPICAL ANESTHETIC:  DESCRIPTION OF PROCEDURE: After the risks benefits and alternatives of the procedure were thoroughly explained, informed consent was obtained.  The [employer] GASTROSCOPE [id number] endoscope was introduced through the mouth and advanced to the second portion of the duodenum. The instrument was slowly withdrawn as the mucosa was fully examined. Estimated blood loss is zero unless otherwise noted in this procedure report.    Findings:  Normal esophagus.  Mild diffuse gastritis, biopsied with cold forceps.  Otherwise normal stomach and pylorus.  Retroflexed views of cardia normal.  Normal duodenum to the second portion.              The scope was then withdrawn from the patient and the procedure completed.  COMPLICATIONS: There were no immediate complications.  ENDOSCOPIC IMPRESSION:     As above.  No obvious explanation for patient's epigastric abdominal pain identified on today's endoscopy.  RECOMMENDATIONS:     1.  Watch for potential complications of procedure. 2.  Await biopsy results. 3.  Proceed with colonoscopy today.  eSigned:  Willis ModenaWilliam Rhea Thrun, MD 01/05/2015 9:46 AM   CC:  CPT CODES: ICD CODES:  The ICD and CPT codes recommended by this software are interpretations from the data that the clinical staff has captured with the software.  The verification of the translation of this report to the ICD and CPT codes and modifiers is the sole responsibility of the health care institution and practicing physician  where this report was generated.  [employer] will not be held responsible for the validity of the ICD and CPT codes included on this report.  AMA assumes no liability for data contained or not contained herein. CPT is a Publishing rights managerregistered trademark of the [employer].

## 2015-01-06 ENCOUNTER — Encounter (HOSPITAL_COMMUNITY): Payer: Self-pay | Admitting: Gastroenterology

## 2015-04-27 ENCOUNTER — Other Ambulatory Visit: Payer: Self-pay | Admitting: Family Medicine

## 2015-04-27 DIAGNOSIS — Z1231 Encounter for screening mammogram for malignant neoplasm of breast: Secondary | ICD-10-CM

## 2015-12-05 ENCOUNTER — Encounter: Payer: Self-pay | Admitting: Gastroenterology

## 2017-03-06 ENCOUNTER — Other Ambulatory Visit: Payer: Self-pay | Admitting: Family Medicine

## 2017-03-06 DIAGNOSIS — Z1231 Encounter for screening mammogram for malignant neoplasm of breast: Secondary | ICD-10-CM

## 2017-04-26 ENCOUNTER — Encounter (HOSPITAL_COMMUNITY): Payer: Self-pay | Admitting: Emergency Medicine

## 2017-04-26 ENCOUNTER — Emergency Department (HOSPITAL_COMMUNITY)
Admission: EM | Admit: 2017-04-26 | Discharge: 2017-04-26 | Disposition: A | Discharge status: Home / Self Care | Payer: Medicare Other | Attending: Emergency Medicine | Admitting: Emergency Medicine

## 2017-04-26 ENCOUNTER — Emergency Department (HOSPITAL_COMMUNITY): Payer: Medicare Other

## 2017-04-26 DIAGNOSIS — R110 Nausea: Secondary | ICD-10-CM | POA: Insufficient documentation

## 2017-04-26 DIAGNOSIS — Z79899 Other long term (current) drug therapy: Secondary | ICD-10-CM | POA: Insufficient documentation

## 2017-04-26 DIAGNOSIS — R51 Headache: Secondary | ICD-10-CM | POA: Diagnosis not present

## 2017-04-26 DIAGNOSIS — R519 Headache, unspecified: Secondary | ICD-10-CM

## 2017-04-26 MED ORDER — DEXAMETHASONE SODIUM PHOSPHATE 10 MG/ML IJ SOLN
10.0000 mg | Freq: Once | INTRAMUSCULAR | Status: AC
  Administered 2017-04-26: 10 mg via INTRAVENOUS
  Filled 2017-04-26: qty 1

## 2017-04-26 MED ORDER — PROCHLORPERAZINE EDISYLATE 5 MG/ML IJ SOLN
10.0000 mg | Freq: Once | INTRAMUSCULAR | Status: AC
  Administered 2017-04-26: 10 mg via INTRAVENOUS
  Filled 2017-04-26: qty 2

## 2017-04-26 MED ORDER — SODIUM CHLORIDE 0.9 % IV BOLUS (SEPSIS)
1000.0000 mL | Freq: Once | INTRAVENOUS | Status: AC
  Administered 2017-04-26: 1000 mL via INTRAVENOUS

## 2017-04-26 MED ORDER — LORAZEPAM 2 MG/ML IJ SOLN
1.0000 mg | Freq: Once | INTRAMUSCULAR | Status: AC
  Administered 2017-04-26: 1 mg via INTRAVENOUS
  Filled 2017-04-26: qty 1

## 2017-04-26 MED ORDER — KETOROLAC TROMETHAMINE 30 MG/ML IJ SOLN
15.0000 mg | Freq: Once | INTRAMUSCULAR | Status: AC
  Administered 2017-04-26: 15 mg via INTRAVENOUS
  Filled 2017-04-26: qty 1

## 2017-04-26 NOTE — ED Triage Notes (Addendum)
Pt in from home with c/o nausea and "worst HA of entire life". Nausea began yesterday afternoon and HA started soon after. Pt denies vomiting or abdominal pain. States the headache is mostly the back of her head, radiates down her neck. Pt is afebrile, 97.7. A&ox4, took 2 tylenol PTA

## 2017-04-26 NOTE — ED Provider Notes (Signed)
[facility]-EMERGENCY DEPT Provider Note   CSN: [id number] Arrival date & time: 04/26/17  0740     History   Chief Complaint Chief Complaint  Patient presents with  . Headache  . Nausea    HPI Shelley [name] is a 71 y.o. female with h/o anxiety, depression, chronic fatigue syndrome, GERD presents to ED for evaluation of headache and nausea x 1 day. Nausea began yesterday and has been intermittent. Headache began after nausea, gradual, 10/10 and worse than any headache she has had before. Headache is on top of her head and radiates down back of head and neck. Has taken tylenol without relief. Has h/o occasional headache that typically respond to two tylenol, but she has taken 4 without relief. Denies fever, light-headedness, slurred speech, facial drooping, visual changes, neck stiffness, generalized rash, CP, SOB, vomiting, numbness/weakness to extremities. No head trauma.   HPI  Past Medical History:  Diagnosis Date  . Anxiety   . Arthritis    osteoarthritis- back, knees, hands  . Chronic fatigue syndrome   . Complication of anesthesia    had decreased temp  . Depression   . Diverticulosis   . GERD (gastroesophageal reflux disease)   . History of hiatal hernia   . IBS (irritable bowel syndrome)   . Internal hemorrhoids   . Seizures (HCC)    migraines    Patient Active Problem List   Diagnosis Date Noted  . DYSURIA 06/30/2010  . INCOMPLETE VOIDING 02/28/2010  . RLQ PAIN 02/28/2010  . THYROID STIMULATING HORMONE, ABNORMAL 11/17/2009  . UNSPECIFIED VITAMIN D DEFICIENCY 11/17/2009  . FATIGUE 11/17/2009  . LUQ PAIN 11/14/2009  . CHANGE IN BOWELS 10/07/2009  . 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 BLADDER PROLAPSE 04/24/2007  . SYMPTOM, ENLARGEMENT, LYMPH NODES 04/24/2007  . GASTRITIS 01/03/2007  . OSTEOARTHROSIS NOS, HAND 10/23/2006  . DEPRESSION, MAJOR, RECURRENT 10/17/2006  . ANXIETY 10/17/2006  . HEMORRHOIDS, NOS 10/17/2006  .  GASTROESOPHAGEAL REFLUX, NO ESOPHAGITIS 10/17/2006  . DIVERTICULITIS OF COLON, NOS 10/17/2006  . IRRITABLE BOWEL SYNDROME 10/17/2006    Past Surgical History:  Procedure Laterality Date  . ABDOMINAL HYSTERECTOMY    . BLADDER SUSPENSION     Prolasped bladder/mesh: revision surgeon -[facility] "remains with retention and constipation"needing more surgery"  . BUNIONECTOMY     bilateral  . CATARACT EXTRACTION, BILATERAL Bilateral   . COLONOSCOPY WITH PROPOFOL N/A 01/05/2015   Procedure: COLONOSCOPY WITH PROPOFOL;  Surgeon: Willis ModenaWilliam Outlaw, MD;  Location: [facility] ENDOSCOPY;  Service: Endoscopy;  Laterality: N/A;  . ESOPHAGOGASTRODUODENOSCOPY (EGD) WITH PROPOFOL N/A 01/05/2015   Procedure: ESOPHAGOGASTRODUODENOSCOPY (EGD) WITH PROPOFOL;  Surgeon: Willis ModenaWilliam Outlaw, MD;  Location: [facility] ENDOSCOPY;  Service: Endoscopy;  Laterality: N/A;  . FACELIFT W/BLEPHAROPLASTY    . NASAL RECONSTRUCTION    . THERAPEUTIC ABORTION     '93    OB History    No data available       Home Medications    Prior to Admission medications   Medication Sig Start Date End Date Taking? Authorizing Provider  acetaminophen (TYLENOL) 500 MG tablet Take 1,000 mg by mouth every 6 (six) hours as needed. For pain   Yes [provider]  amitriptyline (ELAVIL) 150 MG tablet Take 150 mg by mouth at bedtime.     Yes [provider]  bismuth subsalicylate (PEPTO BISMOL) 262 MG chewable tablet Chew 524 mg by mouth 3 (three) times daily as needed. Stomach pain   Yes [provider]  famotidine (PEPCID) 40 MG  tablet Take 40 mg by mouth every evening.    Yes [provider]  LORazepam (ATIVAN) 0.5 MG tablet Take 0.5 mg by mouth 2 (two) times daily. Anxiety and insomnia   Yes [provider]  polyethylene glycol powder (GLYCOLAX/MIRALAX) powder Take 17 g by mouth as needed. Patient taking differently: Take 17 g by mouth daily as needed for mild constipation.  06/03/12  Yes Esterwood, Amy S, PA-C    simethicone (MYLICON) 125 MG chewable tablet Chew 125 mg by mouth every 6 (six) hours as needed. For gas   Yes [provider]  dexlansoprazole (DEXILANT) 60 MG capsule Take 1 capsule (60 mg total) by mouth daily. Patient not taking: Reported on 04/26/2017 06/03/12   Sammuel Cooper, PA-C    Family History Family History  Problem Relation Age of Onset  . Emphysema Mother        Died at 1  . Heart failure Father   . Hypertension Father   . Diabetes type II Brother   . Alcohol abuse Sister        x2  . Alcohol abuse Brother        x3    Social History Social History  Substance Use Topics  . Smoking status: Never Smoker  . Smokeless tobacco: Never Used  . Alcohol use No     Allergies   Nitrofurantoin; Aspirin; Chlordiazepoxide hcl; Meperidine hcl; Milk-related compounds; Povidone-iodine; Amoxicillin; Betadine [povidone iodine]; Lactalbumin; Penicillins; and Whey   Review of Systems Review of Systems  Constitutional: Negative for chills, diaphoresis and fever.  HENT: Negative for congestion.   Eyes: Negative for visual disturbance.  Respiratory: Negative for shortness of breath.   Cardiovascular: Negative for chest pain.  Gastrointestinal: Positive for nausea. Negative for abdominal pain and vomiting.  Musculoskeletal: Positive for neck pain. Negative for neck stiffness.  Skin: Negative for rash.  Neurological: Positive for headaches. Negative for syncope, speech difficulty, weakness, light-headedness and numbness.     Physical Exam Updated Vital Signs BP (!) 151/80   Pulse 72   Resp 16   Ht  (1.626 m)   Wt 59 kg (130 lb)   SpO2 100%   BMI 22.31 kg/m   Physical Exam  Constitutional: She is oriented to person, place, and time. She appears well-developed and well-nourished. No distress.  NAD  HENT:  Head: Normocephalic and atraumatic.  Nose: Nose normal.  Mouth/Throat: Oropharynx is clear and moist. No oropharyngeal exudate.  Eyes:  Conjunctivae are normal.  PERRL and EOMs intact bilaterally No nystagmus No temporal artery tenderness  Neck: Normal range of motion.  Neck is supple. Painless PROM without rigidity   Cardiovascular: Normal rate, regular rhythm, normal heart sounds and intact distal pulses.   No murmur heard. Pulmonary/Chest: Effort normal and breath sounds normal. No respiratory distress. She has no wheezes. She has no rales. She exhibits no tenderness.  Abdominal: Soft. Bowel sounds are normal. She exhibits no distension. There is no tenderness.  Musculoskeletal: Normal range of motion. She exhibits no deformity.  Lymphadenopathy:    She has no cervical adenopathy.  Neurological: She is alert and oriented to person, place, and time. No sensory deficit.  A&O to self, place and time. Speech and phonation normal.  Thought process coherent.   Strength 5/5 in upper and lower extremities.   Sensation to light touch intact in upper and lower extremities.  Gait normal.   Negative Romberg. No leg drift.  Intact finger to nose test. CN I not  tested CN II full visual fields  CN III, IV, VI PEERL and EOMs intact bilaterally CN V light touch intact in all 3 divisions of trigeminal nerve CN VII facial nerve movements intact, symmetric, bilaterally CN VIII hearing intact to finger rub, bilaterally CN IX, X no uvula deviation, symmetric soft palate rise CN XI 5/5 SCM and trapezius strength bilaterally  CN XII Tongue midline with symmetric L/R movement  Skin: Skin is warm and dry. Capillary refill takes less than 2 seconds. No rash noted.  Psychiatric: She has a normal mood and affect. Her behavior is normal. Judgment and thought content normal.  Nursing note and vitals reviewed.    ED Treatments / Results  Labs (all labs ordered are listed, but only abnormal results are displayed) Labs Reviewed - No data to display  EKG  EKG Interpretation None       Radiology No results  found.  Procedures Procedures (including critical care time)  Medications Ordered in ED Medications  sodium chloride 0.9 % bolus 1,000 mL (0 mLs Intravenous Stopped 04/26/17 1033)  prochlorperazine (COMPAZINE) injection 10 mg (10 mg Intravenous Given 04/26/17 0900)  dexamethasone (DECADRON) injection 10 mg (10 mg Intravenous Given 04/26/17 0859)  LORazepam (ATIVAN) injection 1 mg (1 mg Intravenous Given 04/26/17 0901)  ketorolac (TORADOL) 30 MG/ML injection 15 mg (15 mg Intravenous Given 04/26/17 0900)     Initial Impression / Assessment and Plan / ED Course  I have reviewed the triage vital signs and the nursing notes.  Pertinent labs & imaging results that were available during my care of the patient were reviewed by me and considered in my medical decision making (see chart for details).  Clinical Course as of Apr 26 1036  Fri Apr 26, 2017  0865 Pt unable to tolerate CT scan states her stomach is too queasy. Explained risks vs benefits of CT scan and she again refused. She is requesting to go home and f/u with PCP after IVF complete. Headache has improved after migraine cocktail.  [CG]    Clinical Course User Index [CG] Liberty Handy, PA-C    71 yo female presents to ED for headache and nausea x 1 day. Worse than other headaches, not responsive to tylenol. No fevers, new neuro deficits, neck stiffness, rash, trauma. Not on anticoagulants. On exam VS WNL, neuro intact. Recommended CT scan but patient did not tolerate despite anxiolytic. I offered pt some time and repeat anxiolytic however she declined and requested discharge with PCP. Explained risk vs benefits of CT scan, pt was given time to think about decision however she declined again. She appears to have sound decision making ability. Will dc with close PCP f/u and strict ED return precautions. She verbalized understanding and agreeable with plan.   Final Clinical Impressions(s) / ED Diagnoses   Final diagnoses:  Bad headache     New Prescriptions New Prescriptions   No medications on file     Jerrell Mylar 04/26/17 1037    Raeford Razor, MD 04/26/17 1115

## 2017-04-26 NOTE — Discharge Instructions (Signed)
You were seen in the ED for headache. You declined CT scan today. Your headache improved with medication.   Please follow up with primary care provider as soon as possible for persistent headache  Return to ED if your headache worsens or is associated with fever, neck stiffness, vision changes, facial drooping, slurred speech, numbness or weakness to extremities

## 2017-12-17 ENCOUNTER — Other Ambulatory Visit: Payer: Self-pay | Admitting: Physician Assistant

## 2017-12-17 DIAGNOSIS — K219 Gastro-esophageal reflux disease without esophagitis: Secondary | ICD-10-CM

## 2017-12-17 DIAGNOSIS — R1310 Dysphagia, unspecified: Secondary | ICD-10-CM

## 2017-12-19 ENCOUNTER — Ambulatory Visit
Admission: RE | Admit: 2017-12-19 | Discharge: 2017-12-19 | Disposition: A | Discharge status: Home / Self Care | Payer: Medicare Other | Source: Ambulatory Visit | Attending: Physician Assistant | Admitting: Physician Assistant

## 2017-12-19 DIAGNOSIS — K219 Gastro-esophageal reflux disease without esophagitis: Secondary | ICD-10-CM

## 2017-12-19 DIAGNOSIS — R1310 Dysphagia, unspecified: Secondary | ICD-10-CM

## 2018-06-10 ENCOUNTER — Other Ambulatory Visit: Payer: Self-pay

## 2018-06-10 ENCOUNTER — Encounter (HOSPITAL_COMMUNITY): Payer: Self-pay | Admitting: Emergency Medicine

## 2018-06-10 ENCOUNTER — Emergency Department (HOSPITAL_COMMUNITY): Payer: Medicare Other

## 2018-06-10 ENCOUNTER — Emergency Department (HOSPITAL_COMMUNITY)
Admission: EM | Admit: 2018-06-10 | Discharge: 2018-06-11 | Disposition: A | Discharge status: Home / Self Care | Payer: Medicare Other | Attending: Emergency Medicine | Admitting: Emergency Medicine

## 2018-06-10 DIAGNOSIS — R197 Diarrhea, unspecified: Secondary | ICD-10-CM

## 2018-06-10 DIAGNOSIS — R1030 Lower abdominal pain, unspecified: Secondary | ICD-10-CM | POA: Diagnosis present

## 2018-06-10 DIAGNOSIS — N390 Urinary tract infection, site not specified: Secondary | ICD-10-CM

## 2018-06-10 DIAGNOSIS — K529 Noninfective gastroenteritis and colitis, unspecified: Secondary | ICD-10-CM

## 2018-06-10 DIAGNOSIS — Z79899 Other long term (current) drug therapy: Secondary | ICD-10-CM | POA: Diagnosis not present

## 2018-06-10 LAB — URINALYSIS, ROUTINE W REFLEX MICROSCOPIC
Bilirubin Urine: NEGATIVE
Glucose, UA: NEGATIVE mg/dL
Ketones, ur: 5 mg/dL — AB
Nitrite: POSITIVE — AB
PH: 5 (ref 5.0–8.0)
Protein, ur: NEGATIVE mg/dL
SPECIFIC GRAVITY, URINE: 1.023 (ref 1.005–1.030)

## 2018-06-10 LAB — COMPREHENSIVE METABOLIC PANEL
ALBUMIN: 3.3 g/dL — AB (ref 3.5–5.0)
ALT: 15 U/L (ref 0–44)
AST: 19 U/L (ref 15–41)
Alkaline Phosphatase: 69 U/L (ref 38–126)
Anion gap: 10 (ref 5–15)
BUN: 14 mg/dL (ref 8–23)
CO2: 24 mmol/L (ref 22–32)
CREATININE: 0.99 mg/dL (ref 0.44–1.00)
Calcium: 8.9 mg/dL (ref 8.9–10.3)
Chloride: 102 mmol/L (ref 98–111)
GFR calc non Af Amer: 56 mL/min — ABNORMAL LOW (ref >60)
GLUCOSE: 122 mg/dL — AB (ref 70–99)
Potassium: 3.7 mmol/L (ref 3.5–5.1)
Sodium: 136 mmol/L (ref 135–145)
TOTAL PROTEIN: 6.9 g/dL (ref 6.5–8.1)
Total Bilirubin: 0.6 mg/dL (ref 0.3–1.2)

## 2018-06-10 LAB — CBC
HCT: 36.2 % (ref 36.0–46.0)
Hemoglobin: 11.2 g/dL — ABNORMAL LOW (ref 12.0–15.0)
MCH: 29.2 pg (ref 26.0–34.0)
MCHC: 30.9 g/dL (ref 30.0–36.0)
MCV: 94.5 fL (ref 80.0–100.0)
PLATELETS: 328 10*3/uL (ref 150–400)
RBC: 3.83 MIL/uL — ABNORMAL LOW (ref 3.87–5.11)
RDW: 12.6 % (ref 11.5–15.5)
WBC: 13.8 10*3/uL — ABNORMAL HIGH (ref 4.0–10.5)
nRBC: 0 % (ref 0.0–0.2)

## 2018-06-10 LAB — LIPASE, BLOOD: LIPASE: 27 U/L (ref 11–51)

## 2018-06-10 MED ORDER — ONDANSETRON 8 MG PO TBDP: 8.0000 mg | ORAL_TABLET | Freq: Three times a day (TID) | ORAL | 0 refills | Status: DC | PRN

## 2018-06-10 MED ORDER — CEFTRIAXONE SODIUM 1 G IJ SOLR
1.0000 g | Freq: Once | INTRAMUSCULAR | Status: AC
  Administered 2018-06-10: 1 g via INTRAVENOUS
  Filled 2018-06-10: qty 10

## 2018-06-10 MED ORDER — ONDANSETRON HCL 4 MG/2ML IJ SOLN
4.0000 mg | Freq: Once | INTRAMUSCULAR | Status: AC
  Administered 2018-06-10: 4 mg via INTRAVENOUS
  Filled 2018-06-10: qty 2

## 2018-06-10 MED ORDER — SODIUM CHLORIDE 0.9 % IV BOLUS
1000.0000 mL | Freq: Once | INTRAVENOUS | Status: AC
  Administered 2018-06-10: 1000 mL via INTRAVENOUS

## 2018-06-10 MED ORDER — CEPHALEXIN 500 MG PO CAPS: 500.0000 mg | ORAL_CAPSULE | Freq: Four times a day (QID) | ORAL | 0 refills | Status: DC

## 2018-06-10 NOTE — Discharge Instructions (Addendum)
It was our pleasure to provide your ER care today - we hope that you feel better.  Rest. Drink plenty of fluids.  Your lab tests show a urinary tract infection - drink plenty of fluids, take antibiotic (keflex) as prescribed. Take zofran as need for nausea. The ct shows 'colitis' - take antibiotic as prescribed.   Follow up with primary care doctor in 1 week if symptoms fail to improve/resolve.  Return to ER if worse, new symptoms, persistent vomiting, worsening or severe abdominal pain, other concern.

## 2018-06-10 NOTE — ED Notes (Signed)
Patient transported to CT 

## 2018-06-10 NOTE — ED Triage Notes (Signed)
Pt reports she has not been able to eat for a week due to the pain and disrrhea.  She states she gets very nauseas when she eats or is up for too long.  Pt states at this time she is "only passing mucus"

## 2018-06-10 NOTE — ED Provider Notes (Signed)
[facility] EMERGENCY DEPARTMENT Provider Note   CSN: [id number] Arrival date & time: 06/10/18  2032     History   Chief Complaint Chief Complaint  Patient presents with  . Abdominal Pain  . Diarrhea    HPI Shelley [name] is a 72 y.o. female.  Patient c/o lower abd pain for the past few days. Pain constant, dull, moderate, bil lower quadrant L>R, non radiating. Also notes diarrhea in past couple days with several loose to mucousy stools per day. +nausea. No vomiting. Hx diverticulosis. Remote hx hysterectomy, no other abd surgery. Denies dysuria or flank pain. ?subj fever.  The history is provided by the patient.  Abdominal Pain   Associated symptoms include diarrhea and nausea. Pertinent negatives include dysuria and headaches.  Diarrhea   Associated symptoms include abdominal pain. Pertinent negatives include no headaches and no cough.    Past Medical History:  Diagnosis Date  . Anxiety   . Arthritis    osteoarthritis- back, knees, hands  . Chronic fatigue syndrome   . Complication of anesthesia    had decreased temp  . Depression   . Diverticulosis   . GERD (gastroesophageal reflux disease)   . History of hiatal hernia   . IBS (irritable bowel syndrome)   . Internal hemorrhoids   . Seizures (HCC)    migraines    Patient Active Problem List   Diagnosis Date Noted  . DYSURIA 06/30/2010  . INCOMPLETE VOIDING 02/28/2010  . RLQ PAIN 02/28/2010  . THYROID STIMULATING HORMONE, ABNORMAL 11/17/2009  . UNSPECIFIED VITAMIN D DEFICIENCY 11/17/2009  . FATIGUE 11/17/2009  . LUQ PAIN 11/14/2009  . CHANGE IN BOWELS 10/07/2009  . 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 BLADDER PROLAPSE 04/24/2007  . SYMPTOM, ENLARGEMENT, LYMPH NODES 04/24/2007  . GASTRITIS 01/03/2007  . OSTEOARTHROSIS NOS, HAND 10/23/2006  . DEPRESSION, MAJOR, RECURRENT 10/17/2006  . ANXIETY 10/17/2006  . HEMORRHOIDS, NOS 10/17/2006  . GASTROESOPHAGEAL  REFLUX, NO ESOPHAGITIS 10/17/2006  . DIVERTICULITIS OF COLON, NOS 10/17/2006  . IRRITABLE BOWEL SYNDROME 10/17/2006    Past Surgical History:  Procedure Laterality Date  . ABDOMINAL HYSTERECTOMY    . BLADDER SUSPENSION     Prolasped bladder/mesh: revision surgeon -[facility] "remains with retention and constipation"needing more surgery"  . BUNIONECTOMY     bilateral  . CATARACT EXTRACTION, BILATERAL Bilateral   . COLONOSCOPY WITH PROPOFOL N/A 01/05/2015   Procedure: COLONOSCOPY WITH PROPOFOL;  Surgeon: Willis Modena, MD;  Location: [facility] ENDOSCOPY;  Service: Endoscopy;  Laterality: N/A;  . ESOPHAGOGASTRODUODENOSCOPY (EGD) WITH PROPOFOL N/A 01/05/2015   Procedure: ESOPHAGOGASTRODUODENOSCOPY (EGD) WITH PROPOFOL;  Surgeon: Willis Modena, MD;  Location: [facility] ENDOSCOPY;  Service: Endoscopy;  Laterality: N/A;  . FACELIFT W/BLEPHAROPLASTY    . NASAL RECONSTRUCTION    . THERAPEUTIC ABORTION     '93     OB History   None      Home Medications    Prior to Admission medications   Medication Sig Start Date End Date Taking? Authorizing Provider  acetaminophen (TYLENOL) 500 MG tablet Take 1,000 mg by mouth every 6 (six) hours as needed. For pain    [provider]  amitriptyline (ELAVIL) 150 MG tablet Take 150 mg by mouth at bedtime.      [provider]  bismuth subsalicylate (PEPTO BISMOL) 262 MG chewable tablet Chew 524 mg by mouth 3 (three) times daily as needed. Stomach pain    [provider]  dexlansoprazole (DEXILANT) 60 MG capsule Take 1  capsule (60 mg total) by mouth daily. Patient not taking: Reported on 04/26/2017 06/03/12   Esterwood, Amy S, PA-C  famotidine (PEPCID) 40 MG tablet Take 40 mg by mouth every evening.     [provider]  LORazepam (ATIVAN) 0.5 MG tablet Take 0.5 mg by mouth 2 (two) times daily. Anxiety and insomnia    [provider]  polyethylene glycol powder (GLYCOLAX/MIRALAX) powder Take 17 g by mouth as needed. Patient taking  differently: Take 17 g by mouth daily as needed for mild constipation.  06/03/12   Esterwood, Amy S, PA-C  simethicone (MYLICON) 125 MG chewable tablet Chew 125 mg by mouth every 6 (six) hours as needed. For gas    [provider]    Family History Family History  Problem Relation Age of Onset  . Emphysema Mother        Died at 65  . Heart failure Father   . Hypertension Father   . Diabetes type II Brother   . Alcohol abuse Sister        x2  . Alcohol abuse Brother        x3    Social History Social History   Tobacco Use  . Smoking status: Never Smoker  . Smokeless tobacco: Never Used  Substance Use Topics  . Alcohol use: No  . Drug use: No     Allergies   Nitrofurantoin; Aspirin; Chlordiazepoxide hcl; Meperidine hcl; Milk-related compounds; Povidone-iodine; Amoxicillin; Betadine [povidone iodine]; Lactalbumin; Penicillins; and Whey   Review of Systems Review of Systems  Constitutional: Positive for appetite change.  HENT: Negative for sore throat.   Eyes: Negative for redness.  Respiratory: Negative for cough and shortness of breath.   Cardiovascular: Negative for chest pain.  Gastrointestinal: Positive for abdominal pain, diarrhea and nausea.  Endocrine: Negative for polyuria.  Genitourinary: Negative for dysuria and flank pain.  Musculoskeletal: Negative for neck pain.  Skin: Negative for rash.  Neurological: Negative for headaches.  Hematological: Does not bruise/bleed easily.  Psychiatric/Behavioral: Negative for confusion.     Physical Exam Updated Vital Signs BP 104/60 (BP Location: Right Arm)   Pulse 90   Temp 98.4 F (36.9 C) (Oral)   Resp 14   Ht 1.626 m (5\' 4" )   Wt 59 kg   SpO2 99%   BMI 22.31 kg/m   Physical Exam  Constitutional: She appears well-developed and well-nourished.  HENT:  Mouth/Throat: Oropharynx is clear and moist.  Eyes: Conjunctivae are normal. No scleral icterus.  Neck: Neck supple. No tracheal deviation  present.  Cardiovascular: Normal rate, regular rhythm, normal heart sounds and intact distal pulses. Exam reveals no gallop and no friction rub.  No murmur heard. Pulmonary/Chest: Effort normal and breath sounds normal. No respiratory distress.  Abdominal: Soft. Normal appearance and bowel sounds are normal. She exhibits no distension and no mass. There is tenderness. There is no rebound and no guarding. No hernia.  Moderate LLQ tenderness.   Genitourinary:  Genitourinary Comments: No cva tenderness.   Musculoskeletal: She exhibits no edema.  Neurological: She is alert.  Skin: Skin is warm and dry. No rash noted.  Psychiatric: She has a normal mood and affect.  Nursing note and vitals reviewed.    ED Treatments / Results  Labs (all labs ordered are listed, but only abnormal results are displayed) Results for orders placed or performed during the hospital encounter of 06/10/18  Lipase, blood  Result Value Ref Range   Lipase 27 11 - 51 U/L  Comprehensive metabolic panel  Result Value Ref Range   Sodium 136 135 - 145 mmol/L   Potassium 3.7 3.5 - 5.1 mmol/L   Chloride 102 98 - 111 mmol/L   CO2 24 22 - 32 mmol/L   Glucose, Bld 122 (H) 70 - 99 mg/dL   BUN 14 8 - 23 mg/dL   Creatinine, Ser 1.61 0.44 - 1.00 mg/dL   Calcium 8.9 8.9 - 09.6 mg/dL   Total Protein 6.9 6.5 - 8.1 g/dL   Albumin 3.3 (L) 3.5 - 5.0 g/dL   AST 19 15 - 41 U/L   ALT 15 0 - 44 U/L   Alkaline Phosphatase 69 38 - 126 U/L   Total Bilirubin 0.6 0.3 - 1.2 mg/dL   GFR calc non Af Amer 56 (L) >60 mL/min   GFR calc Af Amer >60 >60 mL/min   Anion gap 10 5 - 15  CBC  Result Value Ref Range   WBC 13.8 (H) 4.0 - 10.5 K/uL   RBC 3.83 (L) 3.87 - 5.11 MIL/uL   Hemoglobin 11.2 (L) 12.0 - 15.0 g/dL   HCT 04.5 40.9 - 81.1 %   MCV 94.5 80.0 - 100.0 fL   MCH 29.2 26.0 - 34.0 pg   MCHC 30.9 30.0 - 36.0 g/dL   RDW 91.4 78.2 - 95.6 %   Platelets 328 150 - 400 K/uL   nRBC 0.0 0.0 - 0.2 %  Urinalysis, Routine w reflex  microscopic  Result Value Ref Range   Color, Urine YELLOW YELLOW   APPearance HAZY (A) CLEAR   Specific Gravity, Urine 1.023 1.005 - 1.030   pH 5.0 5.0 - 8.0   Glucose, UA NEGATIVE NEGATIVE mg/dL   Hgb urine dipstick MODERATE (A) NEGATIVE   Bilirubin Urine NEGATIVE NEGATIVE   Ketones, ur 5 (A) NEGATIVE mg/dL   Protein, ur NEGATIVE NEGATIVE mg/dL   Nitrite POSITIVE (A) NEGATIVE   Leukocytes, UA MODERATE (A) NEGATIVE   RBC / HPF 6-10 0 - 5 RBC/hpf   WBC, UA 21-50 0 - 5 WBC/hpf   Bacteria, UA MANY (A) NONE SEEN   Squamous Epithelial / LPF 0-5 0 - 5   Mucus PRESENT    Hyaline Casts, UA PRESENT    EKG None  Radiology Ct Abdomen Pelvis Wo Contrast  Result Date: 06/10/2018 CLINICAL DATA:  Acute abdominal pain. Abdominal pain for 1 week diarrhea. EXAM: CT ABDOMEN AND PELVIS WITHOUT CONTRAST TECHNIQUE: Multidetector CT imaging of the abdomen and pelvis was performed following the standard protocol without IV contrast. COMPARISON:  CT 06/05/2012 FINDINGS: Lower chest: Mild lingular scarring. No consolidation or pleural fluid. Hepatobiliary: No focal abnormality on noncontrast CT. Gallbladder physiologically distended, no calcified stone. No biliary dilatation. Pancreas: Not well evaluated due to lack contrast and adjacent unopacified bowel. Allowing for this, no pancreatic inflammation. No evidence of ductal dilatation. Spleen: Normal in size without focal abnormality. Adrenals/Urinary Tract: Stable 16 mm left adrenal adenoma. The right adrenal gland is normal. No perinephric edema. Bilateral extrarenal pelvis configuration of both kidneys, similar to prior exam. No frank hydronephrosis. Small cyst in the right kidney on prior is not well seen in the absence of contrast. Urinary bladder is physiologically distended. No bladder wall thickening. No urolithiasis. Stomach/Bowel: Bowel evaluation is limited in the absence of enteric contrast. The stomach is nondistended. Duodenal diverticulum as before  without acute inflammation. Small bowel is nondistended, no obstruction. There is pan colonic wall thickening with mild pericolonic edema. Colon is tortuous. Appendix is tentatively  identified and normal. Vascular/Lymphatic: Mild aorta bi-iliac atherosclerosis. No aneurysm. Lack contrast and paucity of intra-abdominal fat partially obscures evaluation for adenopathy. No bulky adenopathy is seen. Reproductive: Status post hysterectomy. No adnexal masses. Other: No free air, ascites, or intra-abdominal abscess. Musculoskeletal: There are no acute or suspicious osseous abnormalities. Degenerative change throughout spine. IMPRESSION: 1. Pancolitis, likely infectious or inflammatory. Consider C-diff. No perforation. 2. Chronic findings are unchanged, as described. 3.  Aortic Atherosclerosis (ICD10-I70.0). Electronically Signed   By: Narda Rutherford M.D.   On: 06/10/2018 23:51    Procedures Procedures (including critical care time)  Medications Ordered in ED Medications  sodium chloride 0.9 % bolus 1,000 mL (has no administration in time range)  ondansetron (ZOFRAN) injection 4 mg (has no administration in time range)  cefTRIAXone (ROCEPHIN) 1 g in sodium chloride 0.9 % 100 mL IVPB (has no administration in time range)     Initial Impression / Assessment and Plan / ED Course  I have reviewed the triage vital signs and the nursing notes.  Pertinent labs & imaging results that were available during my care of the patient were reviewed by me and considered in my medical decision making (see chart for details).  Iv ns bolus. zofran iv.   Labs.   Reviewed nursing notes and prior charts for additional history.   Persistent LLQ tenderness, will get ct imaging.   Labs reviewed - ua is positive, will cx and tx. No dysuria or cva tenderness, and as persistent LLQ tenderness, hx diverticulosis, await ct.   Ct reviewed - c/w colitis. Pt was on antibiotic a couple weeks ago w her surgical procedure - ?  Possible c diff, although pts description of diarrhea is not particularly foul and/or malodorous.  Pt instructed if has a diarrheal stool in ED, will send for studies - as of yet, no recurrent diarrhea in ED. On recheck, abd is soft and non tender, and pt is not toxic appearing.   Po fluids. No vomiting.   Discussed imaging and labs w pt.   Given recent abx therapy and now diarrhea w colitis on ct, will tx for possible c diff. Abx given for uti as well.  rec close pcp f/u.  Return precautions provided.     Final Clinical Impressions(s) / ED Diagnoses   Final diagnoses:  None    ED Discharge Orders    None       Cathren Laine, MD 06/11/18 [phone]

## 2018-06-11 DIAGNOSIS — N390 Urinary tract infection, site not specified: Secondary | ICD-10-CM | POA: Diagnosis not present

## 2018-06-11 MED ORDER — VANCOMYCIN HCL 125 MG PO CAPS: 125.0000 mg | ORAL_CAPSULE | Freq: Four times a day (QID) | ORAL | 0 refills | Status: DC

## 2018-06-11 NOTE — ED Notes (Signed)
Patient verbalizes understanding of discharge instructions. Opportunity for questioning and answers were provided. Armband removed by staff, pt discharged from ED ambulatory.   

## 2018-06-12 ENCOUNTER — Observation Stay (HOSPITAL_COMMUNITY)
Admission: EM | Admit: 2018-06-12 | Discharge: 2018-06-14 | Disposition: A | Discharge status: Home / Self Care | Payer: Medicare Other | Attending: Internal Medicine | Admitting: Internal Medicine

## 2018-06-12 ENCOUNTER — Other Ambulatory Visit: Payer: Self-pay

## 2018-06-12 ENCOUNTER — Encounter (HOSPITAL_COMMUNITY): Payer: Self-pay

## 2018-06-12 DIAGNOSIS — R109 Unspecified abdominal pain: Secondary | ICD-10-CM | POA: Diagnosis present

## 2018-06-12 DIAGNOSIS — K589 Irritable bowel syndrome without diarrhea: Secondary | ICD-10-CM | POA: Insufficient documentation

## 2018-06-12 DIAGNOSIS — Z885 Allergy status to narcotic agent status: Secondary | ICD-10-CM | POA: Diagnosis not present

## 2018-06-12 DIAGNOSIS — M19041 Primary osteoarthritis, right hand: Secondary | ICD-10-CM | POA: Insufficient documentation

## 2018-06-12 DIAGNOSIS — K219 Gastro-esophageal reflux disease without esophagitis: Secondary | ICD-10-CM | POA: Diagnosis present

## 2018-06-12 DIAGNOSIS — K449 Diaphragmatic hernia without obstruction or gangrene: Secondary | ICD-10-CM | POA: Diagnosis not present

## 2018-06-12 DIAGNOSIS — Z90710 Acquired absence of both cervix and uterus: Secondary | ICD-10-CM | POA: Diagnosis not present

## 2018-06-12 DIAGNOSIS — M479 Spondylosis, unspecified: Secondary | ICD-10-CM | POA: Diagnosis not present

## 2018-06-12 DIAGNOSIS — E559 Vitamin D deficiency, unspecified: Secondary | ICD-10-CM | POA: Insufficient documentation

## 2018-06-12 DIAGNOSIS — M170 Bilateral primary osteoarthritis of knee: Secondary | ICD-10-CM | POA: Diagnosis not present

## 2018-06-12 DIAGNOSIS — Z880 Allergy status to penicillin: Secondary | ICD-10-CM | POA: Diagnosis not present

## 2018-06-12 DIAGNOSIS — F418 Other specified anxiety disorders: Secondary | ICD-10-CM | POA: Diagnosis present

## 2018-06-12 DIAGNOSIS — E871 Hypo-osmolality and hyponatremia: Secondary | ICD-10-CM | POA: Diagnosis not present

## 2018-06-12 DIAGNOSIS — K529 Noninfective gastroenteritis and colitis, unspecified: Secondary | ICD-10-CM | POA: Diagnosis not present

## 2018-06-12 DIAGNOSIS — Z886 Allergy status to analgesic agent status: Secondary | ICD-10-CM | POA: Diagnosis not present

## 2018-06-12 DIAGNOSIS — Z79899 Other long term (current) drug therapy: Secondary | ICD-10-CM | POA: Diagnosis not present

## 2018-06-12 DIAGNOSIS — M19042 Primary osteoarthritis, left hand: Secondary | ICD-10-CM | POA: Insufficient documentation

## 2018-06-12 DIAGNOSIS — Z888 Allergy status to other drugs, medicaments and biological substances status: Secondary | ICD-10-CM | POA: Insufficient documentation

## 2018-06-12 DIAGNOSIS — Z8249 Family history of ischemic heart disease and other diseases of the circulatory system: Secondary | ICD-10-CM | POA: Insufficient documentation

## 2018-06-12 DIAGNOSIS — I708 Atherosclerosis of other arteries: Secondary | ICD-10-CM | POA: Diagnosis not present

## 2018-06-12 DIAGNOSIS — E860 Dehydration: Secondary | ICD-10-CM | POA: Insufficient documentation

## 2018-06-12 DIAGNOSIS — Z881 Allergy status to other antibiotic agents status: Secondary | ICD-10-CM | POA: Insufficient documentation

## 2018-06-12 LAB — URINALYSIS, ROUTINE W REFLEX MICROSCOPIC
Bilirubin Urine: NEGATIVE
GLUCOSE, UA: NEGATIVE mg/dL
KETONES UR: 80 mg/dL — AB
Leukocytes, UA: NEGATIVE
Nitrite: NEGATIVE
PH: 6 (ref 5.0–8.0)
Protein, ur: NEGATIVE mg/dL
SPECIFIC GRAVITY, URINE: 1.015 (ref 1.005–1.030)

## 2018-06-12 LAB — CBC
HCT: 35.9 % — ABNORMAL LOW (ref 36.0–46.0)
HEMOGLOBIN: 11.3 g/dL — AB (ref 12.0–15.0)
MCH: 29.4 pg (ref 26.0–34.0)
MCHC: 31.5 g/dL (ref 30.0–36.0)
MCV: 93.2 fL (ref 80.0–100.0)
PLATELETS: 355 10*3/uL (ref 150–400)
RBC: 3.85 MIL/uL — ABNORMAL LOW (ref 3.87–5.11)
RDW: 12.6 % (ref 11.5–15.5)
WBC: 18.4 10*3/uL — ABNORMAL HIGH (ref 4.0–10.5)
nRBC: 0 % (ref 0.0–0.2)

## 2018-06-12 LAB — LIPASE, BLOOD: Lipase: 24 U/L (ref 11–51)

## 2018-06-12 LAB — COMPREHENSIVE METABOLIC PANEL
ALT: 18 U/L (ref 0–44)
ANION GAP: 13 (ref 5–15)
AST: 23 U/L (ref 15–41)
Albumin: 3 g/dL — ABNORMAL LOW (ref 3.5–5.0)
Alkaline Phosphatase: 71 U/L (ref 38–126)
BILIRUBIN TOTAL: 0.7 mg/dL (ref 0.3–1.2)
BUN: 10 mg/dL (ref 8–23)
CO2: 24 mmol/L (ref 22–32)
Calcium: 8.9 mg/dL (ref 8.9–10.3)
Chloride: 97 mmol/L — ABNORMAL LOW (ref 98–111)
Creatinine, Ser: 0.96 mg/dL (ref 0.44–1.00)
GFR calc Af Amer: >60 mL/min (ref >60)
GFR, EST NON AFRICAN AMERICAN: 59 mL/min — AB (ref >60)
Glucose, Bld: 98 mg/dL (ref 70–99)
POTASSIUM: 3.6 mmol/L (ref 3.5–5.1)
Sodium: 134 mmol/L — ABNORMAL LOW (ref 135–145)
TOTAL PROTEIN: 6.6 g/dL (ref 6.5–8.1)

## 2018-06-12 LAB — URINE CULTURE: Culture: <10000 — AB

## 2018-06-12 NOTE — ED Triage Notes (Signed)
Pt here for not being able to keep food down for over a week. Seen here two days ago and given abx treatment for c diff and UTI. Pt states nothing is helping and still cannot eat.

## 2018-06-13 ENCOUNTER — Emergency Department (HOSPITAL_COMMUNITY): Payer: Medicare Other

## 2018-06-13 ENCOUNTER — Encounter (HOSPITAL_COMMUNITY): Payer: Self-pay | Admitting: Internal Medicine

## 2018-06-13 DIAGNOSIS — K529 Noninfective gastroenteritis and colitis, unspecified: Secondary | ICD-10-CM | POA: Diagnosis not present

## 2018-06-13 DIAGNOSIS — E871 Hypo-osmolality and hyponatremia: Secondary | ICD-10-CM | POA: Diagnosis present

## 2018-06-13 DIAGNOSIS — F418 Other specified anxiety disorders: Secondary | ICD-10-CM | POA: Diagnosis present

## 2018-06-13 LAB — GASTROINTESTINAL PANEL BY PCR, STOOL (REPLACES STOOL CULTURE)

## 2018-06-13 LAB — I-STAT CG4 LACTIC ACID, ED: Lactic Acid, Venous: 0.56 mmol/L (ref 0.5–1.9)

## 2018-06-13 LAB — C DIFFICILE QUICK SCREEN W PCR REFLEX
C DIFFICILE (CDIFF) TOXIN: POSITIVE — AB
C Diff antigen: POSITIVE — AB
C Diff interpretation: DETECTED

## 2018-06-13 MED ORDER — POTASSIUM CHLORIDE IN NACL 20-0.9 MEQ/L-% IV SOLN
INTRAVENOUS | Status: DC
  Administered 2018-06-13: 21:00:00 via INTRAVENOUS
  Administered 2018-06-13: 100 mL/h via INTRAVENOUS
  Filled 2018-06-13 (×4): qty 1000

## 2018-06-13 MED ORDER — POLYETHYL GLYC-PROPYL GLYC PF 0.4-0.3 % OP SOLN: 1.0000 [drp] | Freq: Every day | OPHTHALMIC | Status: DC | PRN

## 2018-06-13 MED ORDER — ALUM & MAG HYDROXIDE-SIMETH 200-200-20 MG/5ML PO SUSP
30.0000 mL | ORAL | Status: DC | PRN
  Administered 2018-06-13: 30 mL via ORAL
  Filled 2018-06-13: qty 30

## 2018-06-13 MED ORDER — SODIUM CHLORIDE 0.9 % IV BOLUS
1000.0000 mL | Freq: Once | INTRAVENOUS | Status: AC
  Administered 2018-06-13: 1000 mL via INTRAVENOUS

## 2018-06-13 MED ORDER — MORPHINE SULFATE (PF) 4 MG/ML IV SOLN: 4.0000 mg | Freq: Once | INTRAVENOUS | Status: DC

## 2018-06-13 MED ORDER — METOCLOPRAMIDE HCL 5 MG/ML IJ SOLN
10.0000 mg | INTRAMUSCULAR | Status: AC
  Administered 2018-06-13: 10 mg via INTRAVENOUS
  Filled 2018-06-13: qty 2

## 2018-06-13 MED ORDER — POLYVINYL ALCOHOL 1.4 % OP SOLN
1.0000 [drp] | Freq: Every day | OPHTHALMIC | Status: DC | PRN
  Filled 2018-06-13: qty 15

## 2018-06-13 MED ORDER — FAMOTIDINE 20 MG PO TABS
40.0000 mg | ORAL_TABLET | Freq: Every day | ORAL | Status: DC
  Administered 2018-06-13: 40 mg via ORAL
  Filled 2018-06-13: qty 2

## 2018-06-13 MED ORDER — CIPROFLOXACIN IN D5W 400 MG/200ML IV SOLN
400.0000 mg | Freq: Once | INTRAVENOUS | Status: AC
  Administered 2018-06-13: 400 mg via INTRAVENOUS
  Filled 2018-06-13: qty 200

## 2018-06-13 MED ORDER — ACETAMINOPHEN 325 MG PO TABS: 650.0000 mg | ORAL_TABLET | Freq: Four times a day (QID) | ORAL | Status: DC | PRN

## 2018-06-13 MED ORDER — LORAZEPAM 0.5 MG PO TABS: 0.5000 mg | ORAL_TABLET | ORAL | Status: DC

## 2018-06-13 MED ORDER — METRONIDAZOLE IN NACL 5-0.79 MG/ML-% IV SOLN
500.0000 mg | Freq: Three times a day (TID) | INTRAVENOUS | Status: DC
  Administered 2018-06-13 – 2018-06-14 (×4): 500 mg via INTRAVENOUS
  Filled 2018-06-13 (×5): qty 100

## 2018-06-13 MED ORDER — PANTOPRAZOLE SODIUM 40 MG PO TBEC
40.0000 mg | DELAYED_RELEASE_TABLET | Freq: Every day | ORAL | Status: DC
  Administered 2018-06-13 – 2018-06-14 (×2): 40 mg via ORAL
  Filled 2018-06-13 (×2): qty 1

## 2018-06-13 MED ORDER — METRONIDAZOLE IN NACL 5-0.79 MG/ML-% IV SOLN
500.0000 mg | Freq: Once | INTRAVENOUS | Status: AC
  Administered 2018-06-13: 500 mg via INTRAVENOUS
  Filled 2018-06-13: qty 100

## 2018-06-13 MED ORDER — HYDROMORPHONE HCL 1 MG/ML IJ SOLN
0.5000 mg | Freq: Once | INTRAMUSCULAR | Status: AC
  Administered 2018-06-13: 0.5 mg via INTRAVENOUS
  Filled 2018-06-13: qty 1

## 2018-06-13 MED ORDER — CIPROFLOXACIN IN D5W 400 MG/200ML IV SOLN: 400.0000 mg | Freq: Two times a day (BID) | INTRAVENOUS | Status: DC

## 2018-06-13 MED ORDER — AMITRIPTYLINE HCL 50 MG PO TABS
150.0000 mg | ORAL_TABLET | Freq: Every day | ORAL | Status: DC
  Administered 2018-06-13: 150 mg via ORAL
  Filled 2018-06-13: qty 3

## 2018-06-13 MED ORDER — ONDANSETRON HCL 4 MG/2ML IJ SOLN: 4.0000 mg | Freq: Four times a day (QID) | INTRAMUSCULAR | Status: DC | PRN

## 2018-06-13 MED ORDER — ACETAMINOPHEN 650 MG RE SUPP: 650.0000 mg | Freq: Four times a day (QID) | RECTAL | Status: DC | PRN

## 2018-06-13 MED ORDER — ONDANSETRON HCL 4 MG PO TABS: 4.0000 mg | ORAL_TABLET | Freq: Four times a day (QID) | ORAL | Status: DC | PRN

## 2018-06-13 NOTE — H&P (Addendum)
History and Physical    NOEMY HALLMON ZOX:[id number] DOB: 1946/08/13 DOA: 06/12/2018  PCP: Patient, No Pcp Per   Patient coming from: Home.  I have personally briefly reviewed patient's old medical records in [facility]  Chief Complaint: Abdominal pain and diarrhea.  HPI: BABY GIEGER is a 72 y.o. female with medical history significant of anxiety, depression, osteoarthritis of back, knees hands, chronic fatigue syndrome, diverticulosis, GERD/hiatal hernia, IBS, history of hemorrhoids, history of seizures, history of migraines who is coming to the emergency department with complaints of abdominal pain, diarrhea and nausea for about a week.  BMs are easily triggered by food intake and she has not been eating much for the past few days.  She says that she has been normally passing mucous stools.  She denies fever, but complains of chills and fatigue.  She she has been having mild dysuria and frequency, but no hematuria.  She was seen here 2 days ago and was discharged on Keflex for presumed to UTI and oral vancomycin for presumed C. difficile colitis since there was CT abdomen/pelvis done 2 days ago which show pancolitis.  When reviewing her intake of antibiotics in the past 90 days, the patient stated that she was taking antibiotics after she had facial plastic surgery, but does not remember which antibiotic or which dosage she was taking.  She also complains of occasional headaches, but denies no rhinorrhea, sore throat, dyspnea, wheezing, chest pain, palpitations, dizziness, diaphoresis, PND, orthopnea or pitting edema of the lower extremities.  No constipation, melena or hematochezia.    No polyuria, polydipsia, polyphagia or blurred vision.  ED Course: Initial vital signs temperature 98.5 F, pulse 88, respirations 16, blood pressure 90/64 mmHg and O2 sat 98% on room air.  She was given a 1000 mL NS bolus, ciprofloxacin 400 mg IVPB, hydromorphone 0.5 mg IVP x1 metoclopramide 10 mg  IVP x1 dose and metronidazole 500 mg IVPB.  Urinalysis showed hazy appearance, moderate hemoglobinuria, 80 mg/dL of ketones, and rare bacteria on microscopic examination. White count is 18.4, hemoglobin 11.3 and platelets 355. Her sodium 134, chloride 97 mmol/L.  Albumin was 3.0 g/dL.  All other values are within normal limits.  Lactic acid was normal.  Lipase was 24 units/L.  C. difficile antigen and toxin were positive.  GI panel by PCR was negative.  Review of Systems: As per HPI otherwise 10 point review of systems negative.   Past Medical History:  Diagnosis Date  . Anxiety   . Arthritis    osteoarthritis- back, knees, hands  . Chronic fatigue syndrome   . Complication of anesthesia    had decreased temp  . Depression   . Diverticulosis   . GERD (gastroesophageal reflux disease)   . History of hiatal hernia   . IBS (irritable bowel syndrome)   . Internal hemorrhoids   . Seizures (HCC)    migraines    Past Surgical History:  Procedure Laterality Date  . ABDOMINAL HYSTERECTOMY    . BLADDER SUSPENSION     Prolasped bladder/mesh: revision surgeon -[facility] "remains with retention and constipation"needing more surgery"  . BUNIONECTOMY     bilateral  . CATARACT EXTRACTION, BILATERAL Bilateral   . COLONOSCOPY WITH PROPOFOL N/A 01/05/2015   Procedure: COLONOSCOPY WITH PROPOFOL;  Surgeon: Willis Modena, MD;  Location: [facility] ENDOSCOPY;  Service: Endoscopy;  Laterality: N/A;  . ESOPHAGOGASTRODUODENOSCOPY (EGD) WITH PROPOFOL N/A 01/05/2015   Procedure: ESOPHAGOGASTRODUODENOSCOPY (EGD) WITH PROPOFOL;  Surgeon: Willis Modena, MD;  Location: [facility] ENDOSCOPY;  Service: Endoscopy;  Laterality: N/A;  . FACELIFT W/BLEPHAROPLASTY    . NASAL RECONSTRUCTION    . THERAPEUTIC ABORTION     '93     reports that she has never smoked. She has never used smokeless tobacco. She reports that she does not drink alcohol or use drugs.  Allergies  Allergen Reactions  . Nitrofurantoin Nausea And Vomiting  .  Penicillins Itching and Rash    Has patient had a PCN reaction causing immediate rash, facial/tongue/throat swelling, SOB or lightheadedness with hypotension: Yes Has patient had a PCN reaction causing severe rash involving mucus membranes or skin necrosis: Yes Has patient had a PCN reaction that required hospitalization: No Has patient had a PCN reaction occurring within the last 10 years: Unk If all of the above answers are "NO", then may proceed with Cephalosporin use.   . Aspirin Nausea And Vomiting, Rash and Other (See Comments)    Stomach pain, also   . Chlordiazepoxide Other (See Comments)    Flushing   . Chlordiazepoxide Hcl Nausea And Vomiting and Nausea Only    Other reaction(s): GI Upset (intolerance), Other (See Comments), Unknown flushing   . Demerol [Meperidine Hcl] Other (See Comments)    Burning sensation all over  . Meperidine Itching and Other (See Comments)    Burning sensation all over/Body was burning up   . Povidone Iodine Rash    Bumps, burning also  . Povidone-Iodine Itching, Rash and Other (See Comments)    Burning and itching and bumps, burning   . Lactose Nausea And Vomiting and Other (See Comments)    GI upset   . Meperidine Hcl Other (See Comments)    Burning and tingling  . Milk-Related Compounds Nausea And Vomiting  . Nexium [Esomeprazole] Nausea And Vomiting  . Omeprazole Nausea And Vomiting  . Amoxicillin Itching and Rash  . Codeine Nausea And Vomiting  . Iodinated Diagnostic Agents Itching  . Lactalbumin Nausea And Vomiting  . Whey Nausea And Vomiting    Family History  Problem Relation Age of Onset  . Emphysema Mother        Died at 14  . Heart failure Father   . Hypertension Father   . Diabetes type II Brother   . Alcohol abuse Sister        x2  . Alcohol abuse Brother        x3   Prior to Admission medications   Medication Sig Start Date End Date Taking? Authorizing Provider  acetaminophen (TYLENOL) 500 MG tablet Take 1,000  mg by mouth every 8 (eight) hours as needed (for headaches).    Yes [provider]  amitriptyline (ELAVIL) 150 MG tablet Take 150 mg by mouth at bedtime.     Yes [provider]  bismuth subsalicylate (PEPTO BISMOL) 262 MG chewable tablet Chew 524 mg by mouth 3 (three) times daily as needed (for stomach pain).    Yes [provider]  cephALEXin (KEFLEX) 500 MG capsule Take 1 capsule (500 mg total) by mouth 4 (four) times daily. 06/10/18  Yes Cathren Laine, MD  famotidine (PEPCID) 20 MG tablet Take 40 mg by mouth at bedtime.  03/17/18  Yes [provider]  LORazepam (ATIVAN) 0.5 MG tablet Take 0.5 mg by mouth See admin instructions. Take 0.5 mg by mouth at bedtime and 0.5 mg one to two times a day as needed for anxiety   Yes [provider]  ondansetron (ZOFRAN ODT) 8 MG disintegrating tablet Take 1 tablet (  8 mg total) by mouth every 8 (eight) hours as needed for nausea or vomiting. 06/10/18  Yes Cathren Laine, MD  Polyethyl Glyc-Propyl Glyc PF (SYSTANE ULTRA PF) 0.4-0.3 % SOLN Place 1 drop into both eyes daily as needed (for irritation).    Yes [provider]  polyethylene glycol powder (GLYCOLAX/MIRALAX) powder Take 17 g by mouth as needed. Patient taking differently: Take 17 g by mouth daily as needed for mild constipation.  06/03/12  Yes Esterwood, Amy S, PA-C  simethicone (MYLICON) 125 MG chewable tablet Chew 125 mg by mouth every 6 (six) hours as needed for flatulence.    Yes [provider]  vancomycin (VANCOCIN HCL) 125 MG capsule Take 1 capsule (125 mg total) by mouth 4 (four) times daily. 06/11/18  Yes Cathren Laine, MD  VITAMIN D-VITAMIN K PO Take 1 tablet by mouth daily.   Yes [provider]  dexlansoprazole (DEXILANT) 60 MG capsule Take 1 capsule (60 mg total) by mouth daily. Patient not taking: Reported on 06/10/2018 06/03/12   Sammuel Cooper, PA-C    Physical Exam: Vitals:   06/13/18 0415 06/13/18 0639  06/13/18 0645 06/13/18 0800  BP: (!) 98/52 (!) 95/51 (!) 96/54 (!) 94/50  Pulse: 88 84 85 78  Resp:      Temp:      TempSrc:      SpO2: 93% 98% 93% 98%    Constitutional: Looks acutely ill, but in NAD. Eyes: PERRL, lids and conjunctivae normal ENMT: Mucous membranes are very dry. Posterior pharynx clear of any exudate or lesions. Neck: normal, supple, no masses, no thyromegaly Respiratory: clear to auscultation bilaterally, no wheezing, no crackles. Normal respiratory effort. No accessory muscle use.  Cardiovascular: Regular rate and rhythm, no murmurs / rubs / gallops. No extremity edema. 2+ pedal pulses. No carotid bruits.  Abdomen: Soft, mild epigastric and left quadrants tenderness, no guarding/rebound/masses palpated. No hepatosplenomegaly. Bowel sounds positive.  Musculoskeletal: no clubbing / cyanosis. Good ROM, no contractures. Normal muscle tone.  Skin: Healing plastic surgery facial incisions.  No rashes, lesions, ulcers. No induration Neurologic: CN 2-12 grossly intact. Sensation intact, DTR normal. Strength 5/5 in all 4.  Psychiatric: Normal judgment and insight. Alert and oriented x 3. Normal mood.   Labs on Admission: I have personally reviewed following labs and imaging studies  CBC: Recent Labs  Lab 06/10/18 2104 06/12/18 2131  WBC 13.8* 18.4*  HGB 11.2* 11.3*  HCT 36.2 35.9*  MCV 94.5 93.2  PLT 328 355   Basic Metabolic Panel: Recent Labs  Lab 06/10/18 2104 06/12/18 2131  NA 136 134*  K 3.7 3.6  CL 102 97*  CO2 24 24  GLUCOSE 122* 98  BUN 14 10  CREATININE 0.99 0.96  CALCIUM 8.9 8.9   GFR: Estimated Creatinine Clearance: 47.1 mL/min (by C-G formula based on SCr of 0.96 mg/dL). Liver Function Tests: Recent Labs  Lab 06/10/18 2104 06/12/18 2131  AST 19 23  ALT 15 18  ALKPHOS 69 71  BILITOT 0.6 0.7  PROT 6.9 6.6  ALBUMIN 3.3* 3.0*   Recent Labs  Lab 06/10/18 2104 06/12/18 2131  LIPASE 27 24   No results for input(s): AMMONIA in the  last 168 hours. Coagulation Profile: No results for input(s): INR, PROTIME in the last 168 hours. Cardiac Enzymes: No results for input(s): CKTOTAL, CKMB, CKMBINDEX, TROPONINI in the last 168 hours. BNP (last 3 results) No results for input(s): PROBNP in the last 8760 hours. HbA1C: No results for input(s): HGBA1C  in the last 72 hours. CBG: No results for input(s): GLUCAP in the last 168 hours. Lipid Profile: No results for input(s): CHOL, HDL, LDLCALC, TRIG, CHOLHDL, LDLDIRECT in the last 72 hours. Thyroid Function Tests: No results for input(s): TSH, T4TOTAL, FREET4, T3FREE, THYROIDAB in the last 72 hours. Anemia Panel: No results for input(s): VITAMINB12, FOLATE, FERRITIN, TIBC, IRON, RETICCTPCT in the last 72 hours. Urine analysis:    Component Value Date/Time   COLORURINE YELLOW 06/12/2018 2058   APPEARANCEUR HAZY (A) 06/12/2018 2058   LABSPEC 1.015 06/12/2018 2058   PHURINE 6.0 06/12/2018 2058   GLUCOSEU NEGATIVE 06/12/2018 2058   HGBUR MODERATE (A) 06/12/2018 2058   HGBUR trace-intact 06/30/2010 1607   BILIRUBINUR NEGATIVE 06/12/2018 2058   KETONESUR 80 (A) 06/12/2018 2058   PROTEINUR NEGATIVE 06/12/2018 2058   UROBILINOGEN 0.2 02/27/2012 1540   NITRITE NEGATIVE 06/12/2018 2058   LEUKOCYTESUR NEGATIVE 06/12/2018 2058    Radiological Exams on Admission: Dg Abd 2 Views  Result Date: 06/13/2018 CLINICAL DATA:  Abdominal pain. Nausea, vomiting, diarrhea for 1 week. EXAM: ABDOMEN - 2 VIEW COMPARISON:  CT 3 days ago 06/10/2018 FINDINGS: No free intra-abdominal air. Fluid level in the ascending colon with loss of normal haustral in the descending colon consistent with colitis, as seen on CT. No small bowel dilatation or obstruction. Pelvic phleboliths without radiopaque calculi. Included lung bases are clear. IMPRESSION: Findings consistent with colitis is seen on recent CT. No bowel obstruction or free air. Electronically Signed   By: Narda Rutherford M.D.   On: 06/13/2018  04:02    EKG: Independently reviewed.   Assessment/Plan Principal Problem:   Colitis Secondary to C. difficile. Continue IV metronidazole. Continue oral vancomycin. Analgesics as needed, but avoid narcotics. Patient made aware of need for minimizing opioid use. Antiemetics as needed. Follow-up CBC, renal function and electrolytes in a.m.  Active Problems:   GERD (gastroesophageal reflux disease) Famotidine 40 mg p.o. at bedtime. Protonix 40 mg p.o. daily.    Depression with anxiety  Continue amitriptyline 150 mg p.o. at bedtime. Continue lorazepam as needed.    Hyponatremia Secondary to GI losses. Replacing with NS. Follow-up sodium level.    DVT prophylaxis: CDs. Code Status: Full code. Family Communication: Disposition Plan: Observation for C. difficile treatment. Consults called: Admission status: Observation/MedSurg.   Bobette Mo MD [facility] Pager [phone].  If 7PM-7AM, please contact night-coverage [email or web address] Password [email or web address]  06/13/2018, 11:30 AM

## 2018-06-13 NOTE — ED Notes (Signed)
Ordered diet tray for pt  

## 2018-06-13 NOTE — ED Provider Notes (Signed)
[facility] EMERGENCY DEPARTMENT Provider Note   CSN: [id number] Arrival date & time: 06/12/18  1933     History   Chief Complaint Chief Complaint  Patient presents with  . Abdominal Pain    HPI Shelley [name] is a 72 y.o. female.   72 year old female with a history of esophageal reflux, IBS presents to the emergency department for evaluation of persistent abdominal pain.  She describes her pain as a soreness.  This is persisting throughout her entire abdomen.  She has had symptoms for at least the last 5 days.  She continues to complain of ongoing watery, nonbloody diarrhea.  She has had too numerous episodes to count.  Also complaining of persistent nausea.  She was evaluated 2 days ago in the emergency department.  Discharged with Keflex for management of UTI as well as oral vancomycin for presumed C. difficile.  C difficile a concern due to CT findings of pancolitis.  The patient was unable to provide a specimen for C. difficile testing.  Was on a very abbreviated course of antibiotics for a recent facelift, but otherwise denies any use of antibiotics.  No recent surgeries or hospitalizations.  She has not had any fevers, vomiting, hematuria.  States that her generalized weakness and fatigue have worsened despite antibiotic treatment and supportive therapies.  Prior abdominal surgeries include abdominal hysterectomy, bladder suspension.     Past Medical History:  Diagnosis Date  . Anxiety   . Arthritis    osteoarthritis- back, knees, hands  . Chronic fatigue syndrome   . Complication of anesthesia    had decreased temp  . Depression   . Diverticulosis   . GERD (gastroesophageal reflux disease)   . History of hiatal hernia   . IBS (irritable bowel syndrome)   . Internal hemorrhoids   . Seizures (HCC)    migraines    Patient Active Problem List   Diagnosis Date Noted  . Colitis 06/13/2018  . DYSURIA 06/30/2010  . INCOMPLETE VOIDING 02/28/2010  .  RLQ PAIN 02/28/2010  . THYROID STIMULATING HORMONE, ABNORMAL 11/17/2009  . UNSPECIFIED VITAMIN D DEFICIENCY 11/17/2009  . FATIGUE 11/17/2009  . LUQ PAIN 11/14/2009  . CHANGE IN BOWELS 10/07/2009  . 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 BLADDER PROLAPSE 04/24/2007  . SYMPTOM, ENLARGEMENT, LYMPH NODES 04/24/2007  . GASTRITIS 01/03/2007  . OSTEOARTHROSIS NOS, HAND 10/23/2006  . DEPRESSION, MAJOR, RECURRENT 10/17/2006  . ANXIETY 10/17/2006  . HEMORRHOIDS, NOS 10/17/2006  . GASTROESOPHAGEAL REFLUX, NO ESOPHAGITIS 10/17/2006  . DIVERTICULITIS OF COLON, NOS 10/17/2006  . IRRITABLE BOWEL SYNDROME 10/17/2006    Past Surgical History:  Procedure Laterality Date  . ABDOMINAL HYSTERECTOMY    . BLADDER SUSPENSION     Prolasped bladder/mesh: revision surgeon -[facility] "remains with retention and constipation"needing more surgery"  . BUNIONECTOMY     bilateral  . CATARACT EXTRACTION, BILATERAL Bilateral   . COLONOSCOPY WITH PROPOFOL N/A 01/05/2015   Procedure: COLONOSCOPY WITH PROPOFOL;  Surgeon: Willis Modena, MD;  Location: [facility];  Service: Endoscopy;  Laterality: N/A;  . ESOPHAGOGASTRODUODENOSCOPY (EGD) WITH PROPOFOL N/A 01/05/2015   Procedure: ESOPHAGOGASTRODUODENOSCOPY (EGD) WITH PROPOFOL;  Surgeon: Willis Modena, MD;  Location: [facility];  Service: Endoscopy;  Laterality: N/A;  . FACELIFT W/BLEPHAROPLASTY    . NASAL RECONSTRUCTION    . THERAPEUTIC ABORTION     '93     OB History   None      Home Medications    Prior to Admission medications  Medication Sig Start Date End Date Taking? Authorizing Provider  acetaminophen (TYLENOL) 500 MG tablet Take 1,000 mg by mouth every 8 (eight) hours as needed (for headaches).    Yes [provider]  amitriptyline (ELAVIL) 150 MG tablet Take 150 mg by mouth at bedtime.     Yes [provider]  bismuth subsalicylate (PEPTO BISMOL) 262 MG chewable tablet Chew 524 mg by mouth 3 (three) times  daily as needed (for stomach pain).    Yes [provider]  cephALEXin (KEFLEX) 500 MG capsule Take 1 capsule (500 mg total) by mouth 4 (four) times daily. 06/10/18  Yes Cathren Laine, MD  famotidine (PEPCID) 20 MG tablet Take 40 mg by mouth at bedtime.  03/17/18  Yes [provider]  LORazepam (ATIVAN) 0.5 MG tablet Take 0.5 mg by mouth See admin instructions. Take 0.5 mg by mouth at bedtime and 0.5 mg one to two times a day as needed for anxiety   Yes [provider]  ondansetron (ZOFRAN ODT) 8 MG disintegrating tablet Take 1 tablet (8 mg total) by mouth every 8 (eight) hours as needed for nausea or vomiting. 06/10/18  Yes Cathren Laine, MD  Polyethyl Glyc-Propyl Glyc PF (SYSTANE ULTRA PF) 0.4-0.3 % SOLN Place 1 drop into both eyes daily as needed (for irritation).    Yes [provider]  polyethylene glycol powder (GLYCOLAX/MIRALAX) powder Take 17 g by mouth as needed. Patient taking differently: Take 17 g by mouth daily as needed for mild constipation.  06/03/12  Yes Esterwood, Amy S, PA-C  simethicone (MYLICON) 125 MG chewable tablet Chew 125 mg by mouth every 6 (six) hours as needed for flatulence.    Yes [provider]  vancomycin (VANCOCIN HCL) 125 MG capsule Take 1 capsule (125 mg total) by mouth 4 (four) times daily. 06/11/18  Yes Cathren Laine, MD  VITAMIN D-VITAMIN K PO Take 1 tablet by mouth daily.   Yes [provider]  dexlansoprazole (DEXILANT) 60 MG capsule Take 1 capsule (60 mg total) by mouth daily. Patient not taking: Reported on 06/10/2018 06/03/12   Sammuel Cooper, PA-C    Family History Family History  Problem Relation Age of Onset  . Emphysema Mother        Died at 63  . Heart failure Father   . Hypertension Father   . Diabetes type II Brother   . Alcohol abuse Sister        x2  . Alcohol abuse Brother        x3    Social History Social History   Tobacco Use  . Smoking status: Never Smoker  . Smokeless  tobacco: Never Used  Substance Use Topics  . Alcohol use: No  . Drug use: No     Allergies   Nitrofurantoin; Penicillins; Aspirin; Chlordiazepoxide; Chlordiazepoxide hcl; Demerol [meperidine hcl]; Meperidine; Povidone iodine; Povidone-iodine; Lactose; Meperidine hcl; Milk-related compounds; Nexium [esomeprazole]; Omeprazole; Amoxicillin; Codeine; Iodinated diagnostic agents; Lactalbumin; and Whey   Review of Systems Review of Systems Ten systems reviewed and are negative for acute change, except as noted in the HPI.    Physical Exam Updated Vital Signs BP (!) 93/56   Pulse 86   Temp 97.9 F (36.6 C) (Oral)   Resp 17   SpO2 95%   Physical Exam  Constitutional: She is oriented to person, place, and time. She appears well-developed and well-nourished. No distress.  Thin, frail appearing; nontoxic.  HENT:  Head: Normocephalic and atraumatic.  Eyes: Conjunctivae and  EOM are normal. No scleral icterus.  Neck: Normal range of motion.  Cardiovascular: Normal rate, regular rhythm and intact distal pulses.  Pulmonary/Chest: Effort normal. No respiratory distress.  Respirations even and unlabored  Abdominal: Soft. She exhibits no mass. There is tenderness. There is no guarding.  Soft, generally tender abdomen.  No focal tenderness.  No peritoneal signs.  Musculoskeletal: Normal range of motion.  Neurological: She is alert and oriented to person, place, and time. She exhibits normal muscle tone. Coordination normal.  Skin: Skin is warm and dry. No rash noted. She is not diaphoretic. No erythema. No pallor.  Psychiatric: She has a normal mood and affect. Her behavior is normal.  Nursing note and vitals reviewed.    ED Treatments / Results  Labs (all labs ordered are listed, but only abnormal results are displayed) Labs Reviewed  COMPREHENSIVE METABOLIC PANEL - Abnormal; Notable for the following components:      Result Value   Sodium 134 (*)    Chloride 97 (*)    Albumin 3.0  (*)    GFR calc non Af Amer 59 (*)    All other components within normal limits  CBC - Abnormal; Notable for the following components:   WBC 18.4 (*)    RBC 3.85 (*)    Hemoglobin 11.3 (*)    HCT 35.9 (*)    All other components within normal limits  URINALYSIS, ROUTINE W REFLEX MICROSCOPIC - Abnormal; Notable for the following components:   APPearance HAZY (*)    Hgb urine dipstick MODERATE (*)    Ketones, ur 80 (*)    Bacteria, UA RARE (*)    All other components within normal limits  C DIFFICILE QUICK SCREEN W PCR REFLEX  GASTROINTESTINAL PANEL BY PCR, STOOL (REPLACES STOOL CULTURE)  LIPASE, BLOOD  I-STAT CG4 LACTIC ACID, ED    EKG None  Radiology Dg Abd 2 Views  Result Date: 06/13/2018 CLINICAL DATA:  Abdominal pain. Nausea, vomiting, diarrhea for 1 week. EXAM: ABDOMEN - 2 VIEW COMPARISON:  CT 3 days ago 06/10/2018 FINDINGS: No free intra-abdominal air. Fluid level in the ascending colon with loss of normal haustral in the descending colon consistent with colitis, as seen on CT. No small bowel dilatation or obstruction. Pelvic phleboliths without radiopaque calculi. Included lung bases are clear. IMPRESSION: Findings consistent with colitis is seen on recent CT. No bowel obstruction or free air. Electronically Signed   By: Narda Rutherford M.D.   On: 06/13/2018 04:02    Procedures Procedures (including critical care time)  Medications Ordered in ED Medications  sodium chloride 0.9 % bolus 1,000 mL (1,000 mLs Intravenous New Bag/Given 06/13/18 0339)  ciprofloxacin (CIPRO) IVPB 400 mg (400 mg Intravenous New Bag/Given 06/13/18 0421)  metroNIDAZOLE (FLAGYL) IVPB 500 mg (has no administration in time range)  amitriptyline (ELAVIL) tablet 150 mg (has no administration in time range)  famotidine (PEPCID) tablet 40 mg (has no administration in time range)  LORazepam (ATIVAN) tablet 0.5 mg (has no administration in time range)  sodium chloride 0.9 % bolus 1,000 mL (0 mLs  Intravenous Stopped 06/13/18 0321)  metoCLOPramide (REGLAN) injection 10 mg (10 mg Intravenous Given 06/13/18 0226)  HYDROmorphone (DILAUDID) injection 0.5 mg (0.5 mg Intravenous Given 06/13/18 0226)    CRITICAL CARE Performed by: Antony Madura   Total critical care time: 35 minutes  Critical care time was exclusive of separately billable procedures and treating other patients.  Critical care was necessary to treat or prevent imminent or life-threatening  deterioration.  Critical care was time spent personally by me on the following activities: development of treatment plan with patient and/or surrogate as well as nursing, discussions with consultants, evaluation of patient's response to treatment, examination of patient, obtaining history from patient or surrogate, ordering and performing treatments and interventions, ordering and review of laboratory studies, ordering and review of radiographic studies, pulse oximetry and re-evaluation of patient's condition.   Initial Impression / Assessment and Plan / ED Course  I have reviewed the triage vital signs and the nursing notes.  Pertinent labs & imaging results that were available during my care of the patient were reviewed by me and considered in my medical decision making (see chart for details).     72 year old female presents to the emergency department for persistent abdominal pain with nausea and too numerous to count episodes of watery, nonbloody diarrhea.  She has a worsening leukocytosis compared to 2 days prior.  Abdomen without peritoneal signs, but is generally tender.  Her x-ray shows no evidence of free air or obstruction.  Imaging is consistent with CT from 2 days ago showing pancolitis.  Concern for dehydration as well as worsening laboratory evaluation despite use of home Keflex and vancomycin.  C. difficile colitis remains on the diagnosis, though other infectious or viral etiology also possible.  She has been started on  ciprofloxacin and Flagyl in the ED.  Hydrated with 2 L of IV fluids.  Plan for admission to [facility] hospitalist service for observation and ongoing hydration, IV abx.   Final Clinical Impressions(s) / ED Diagnoses   Final diagnoses:  Abdominal pain  Colitis  Dehydration    ED Discharge Orders    None       Antony Madura, PA-C 06/13/18 0430    Ward, Layla Maw, DO 06/13/18 [phone]

## 2018-06-14 DIAGNOSIS — K529 Noninfective gastroenteritis and colitis, unspecified: Secondary | ICD-10-CM | POA: Diagnosis not present

## 2018-06-14 LAB — COMPREHENSIVE METABOLIC PANEL
ALBUMIN: 2.4 g/dL — AB (ref 3.5–5.0)
ALK PHOS: 54 U/L (ref 38–126)
ALT: 22 U/L (ref 0–44)
AST: 26 U/L (ref 15–41)
Anion gap: 6 (ref 5–15)
CO2: 25 mmol/L (ref 22–32)
Calcium: 8.2 mg/dL — ABNORMAL LOW (ref 8.9–10.3)
Chloride: 110 mmol/L (ref 98–111)
Creatinine, Ser: 0.67 mg/dL (ref 0.44–1.00)
GFR calc Af Amer: >60 mL/min (ref >60)
GFR calc non Af Amer: >60 mL/min (ref >60)
GLUCOSE: 93 mg/dL (ref 70–99)
POTASSIUM: 4 mmol/L (ref 3.5–5.1)
Sodium: 141 mmol/L (ref 135–145)
Total Bilirubin: 0.4 mg/dL (ref 0.3–1.2)
Total Protein: 5.1 g/dL — ABNORMAL LOW (ref 6.5–8.1)

## 2018-06-14 LAB — CBC WITH DIFFERENTIAL/PLATELET
Abs Immature Granulocytes: 0.04 10*3/uL (ref 0.00–0.07)
BASOS ABS: 0 10*3/uL (ref 0.0–0.1)
Basophils Relative: 0 %
Eosinophils Absolute: 0.3 10*3/uL (ref 0.0–0.5)
Eosinophils Relative: 5 %
HEMATOCRIT: 30.2 % — AB (ref 36.0–46.0)
HEMOGLOBIN: 9.8 g/dL — AB (ref 12.0–15.0)
IMMATURE GRANULOCYTES: 1 %
LYMPHS ABS: 1.4 10*3/uL (ref 0.7–4.0)
LYMPHS PCT: 20 %
MCH: 29.8 pg (ref 26.0–34.0)
MCHC: 32.5 g/dL (ref 30.0–36.0)
MCV: 91.8 fL (ref 80.0–100.0)
Monocytes Absolute: 0.6 10*3/uL (ref 0.1–1.0)
Monocytes Relative: 9 %
NEUTROS ABS: 4.5 10*3/uL (ref 1.7–7.7)
Neutrophils Relative %: 65 %
Platelets: 291 10*3/uL (ref 150–400)
RBC: 3.29 MIL/uL — ABNORMAL LOW (ref 3.87–5.11)
RDW: 12.9 % (ref 11.5–15.5)
WBC: 6.9 10*3/uL (ref 4.0–10.5)
nRBC: 0 % (ref 0.0–0.2)

## 2018-06-14 LAB — HIV ANTIBODY (ROUTINE TESTING W REFLEX): HIV SCREEN 4TH GENERATION: NONREACTIVE

## 2018-06-14 MED ORDER — METRONIDAZOLE 500 MG PO TABS: 500.0000 mg | ORAL_TABLET | Freq: Three times a day (TID) | ORAL | Status: DC

## 2018-06-14 MED ORDER — METRONIDAZOLE 500 MG PO TABS: 500.0000 mg | ORAL_TABLET | Freq: Three times a day (TID) | ORAL | 0 refills | Status: DC

## 2018-06-14 NOTE — Discharge Summary (Signed)
Shelley [name], is a 72 y.o. female  DOB 06-Nov-1945  MRN [id number].  Admission date:  06/12/2018  Admitting Physician  John Giovanni, MD  Discharge Date:  06/14/2018   Primary MD  Patient, No Pcp Per  Recommendations for primary care physician for things to follow:   follow-up on CBC and CMP   Admission Diagnosis  Dehydration [E86.0] Colitis [K52.9] Abdominal pain [R10.9]   Discharge Diagnosis  Dehydration [E86.0] Colitis [K52.9] Abdominal pain [R10.9]    Principal Problem:   Colitis Active Problems:   GERD (gastroesophageal reflux disease)   Depression with anxiety   Hyponatremia      Past Medical History:  Diagnosis Date  . Anxiety   . Arthritis    osteoarthritis- back, knees, hands  . Chronic fatigue syndrome   . Complication of anesthesia    had decreased temp  . Depression   . Diverticulosis   . GERD (gastroesophageal reflux disease)   . History of hiatal hernia   . IBS (irritable bowel syndrome)   . Internal hemorrhoids   . Seizures (HCC)    migraines    Past Surgical History:  Procedure Laterality Date  . ABDOMINAL HYSTERECTOMY    . BLADDER SUSPENSION     Prolasped bladder/mesh: revision surgeon -[facility] "remains with retention and constipation"needing more surgery"  . BUNIONECTOMY     bilateral  . CATARACT EXTRACTION, BILATERAL Bilateral   . COLONOSCOPY WITH PROPOFOL N/A 01/05/2015   Procedure: COLONOSCOPY WITH PROPOFOL;  Surgeon: Willis Modena, MD;  Location: [facility];  Service: Endoscopy;  Laterality: N/A;  . ESOPHAGOGASTRODUODENOSCOPY (EGD) WITH PROPOFOL N/A 01/05/2015   Procedure: ESOPHAGOGASTRODUODENOSCOPY (EGD) WITH PROPOFOL;  Surgeon: Willis Modena, MD;  Location: [facility];  Service: Endoscopy;  Laterality: N/A;  . FACELIFT W/BLEPHAROPLASTY    . NASAL RECONSTRUCTION    . THERAPEUTIC ABORTION     '93       HPI  from the history and physical  done on the day of admission:  Shelley [name] is a 72 y.o. female with medical history significant of anxiety, depression, osteoarthritis of back, knees hands, chronic fatigue syndrome, diverticulosis, GERD/hiatal hernia, IBS, history of hemorrhoids, history of seizures, history of migraines who is coming to the emergency department with complaints of abdominal pain, diarrhea and nausea for about a week.  BMs are easily triggered by food intake and she has not been eating much for the past few days.  She says that she has been normally passing mucous stools.  She denies fever, but complains of chills and fatigue.  She she has been having mild dysuria and frequency, but no hematuria.  She was seen here 2 days ago and was discharged on Keflex for presumed to UTI and oral vancomycin for presumed C. difficile colitis since there was CT abdomen/pelvis done 2 days ago which show pancolitis.  When reviewing her intake of antibiotics in the past 90 days, the patient stated that she was taking antibiotics after she had facial plastic surgery, but does not remember which antibiotic  or which dosage she was taking.  She also complains of occasional headaches, but denies no rhinorrhea, sore throat, dyspnea, wheezing, chest pain, palpitations, dizziness, diaphoresis, PND, orthopnea or pitting edema of the lower extremities.  No constipation, melena or hematochezia.    No polyuria, polydipsia, polyphagia or blurred vision.  ED Course: Initial vital signs temperature 98.5 F, pulse 88, respirations 16, blood pressure 90/64 mmHg and O2 sat 98% on room air.  She was given a 1000 mL NS bolus, ciprofloxacin 400 mg IVPB, hydromorphone 0.5 mg IVP x1 metoclopramide 10 mg IVP x1 dose and metronidazole 500 mg IVPB.  Urinalysis showed hazy appearance, moderate hemoglobinuria, 80 mg/dL of ketones, and rare bacteria on microscopic examination. White count is 18.4, hemoglobin 11.3 and platelets 355. Her sodium 134, chloride 97  mmol/L.  Albumin was 3.0 g/dL.  All other values are within normal limits.  Lactic acid was normal.  Lipase was 24 units/L.  C. difficile antigen and toxin were positive.  GI panel by PCR was negative.        Hospital Course:   Patient was admitted overnight for observation with IV fluids and IV antibiotics Flagyl and oral vancomycin, with monitor renal function electrolytes and CBC.  Overnight patient's leukocytosis resolved.   Had diarrhea about almost resolved has had only once earlier this morning.  By 5:00 p.m. this evening patient was doing well able to ambulate  Without any dizziness,  Afebrile, no abdominal pain, no nausea, no vomiting tolerated advanced diet and was able to tolerate her food tray this evening.  She is going to be discharged for continued care with early follow-up PCP next week.  Educated about proper daily hydration   Discharge Condition:  Improved and satisfactory  Follow UP     Consults obtained - n/a  Diet and Activity recommendation:  As advised  Discharge Instructions    * Discharge Instructions    Call MD for:  difficulty breathing, headache or visual disturbances   Complete by:  As directed    Call MD for:  extreme fatigue   Complete by:  As directed    Call MD for:  hives   Complete by:  As directed    Call MD for:  persistant dizziness or light-headedness   Complete by:  As directed    Call MD for:  persistant nausea and vomiting   Complete by:  As directed    Call MD for:  redness, tenderness, or signs of infection (pain, swelling, redness, odor or green/yellow discharge around incision site)   Complete by:  As directed    Call MD for:  severe uncontrolled pain   Complete by:  As directed    Call MD for:  temperature >100.4   Complete by:  As directed    Diet - low sodium heart healthy   Complete by:  As directed    Increase activity slowly   Complete by:  As directed         Discharge Medications     Allergies as of 06/14/2018       Reactions   Nitrofurantoin Nausea And Vomiting   Penicillins Itching, Rash   Has patient had a PCN reaction causing immediate rash, facial/tongue/throat swelling, SOB or lightheadedness with hypotension: Yes Has patient had a PCN reaction causing severe rash involving mucus membranes or skin necrosis: Yes Has patient had a PCN reaction that required hospitalization: No Has patient had a PCN reaction occurring within the last 10 years: Unk If all  of the above answers are "NO", then may proceed with Cephalosporin use.   Aspirin Nausea And Vomiting, Rash, Other (See Comments)   Stomach pain, also   Chlordiazepoxide Other (See Comments)   Flushing   Chlordiazepoxide Hcl Nausea And Vomiting, Nausea Only   Other reaction(s): GI Upset (intolerance), Other (See Comments), Unknown flushing   Demerol [meperidine Hcl] Other (See Comments)   Burning sensation all over   Meperidine Itching, Other (See Comments)   Burning sensation all over/Body was burning up   Povidone Iodine Rash   Bumps, burning also   Povidone-iodine Itching, Rash, Other (See Comments)   Burning and itching and bumps, burning   Lactose Nausea And Vomiting, Other (See Comments)   GI upset   Meperidine Hcl Other (See Comments)   Burning and tingling   Milk-related Compounds Nausea And Vomiting   Nexium [esomeprazole] Nausea And Vomiting   Omeprazole Nausea And Vomiting   Amoxicillin Itching, Rash   Codeine Nausea And Vomiting   Iodinated Diagnostic Agents Itching   Lactalbumin Nausea And Vomiting   Whey Nausea And Vomiting      Medication List    STOP taking these medications   cephALEXin 500 MG capsule Commonly known as:  KEFLEX   polyethylene glycol powder powder Commonly known as:  GLYCOLAX/MIRALAX   SYSTANE ULTRA PF 0.4-0.3 % Soln Generic drug:  Polyethyl Glyc-Propyl Glyc PF     TAKE these medications   acetaminophen 500 MG tablet Commonly known as:  TYLENOL Take 1,000 mg by mouth every 8 (eight)  hours as needed (for headaches).   amitriptyline 150 MG tablet Commonly known as:  ELAVIL Take 150 mg by mouth at bedtime.   bismuth subsalicylate 262 MG chewable tablet Commonly known as:  PEPTO BISMOL Chew 524 mg by mouth 3 (three) times daily as needed (for stomach pain).   dexlansoprazole 60 MG capsule Commonly known as:  DEXILANT Take 1 capsule (60 mg total) by mouth daily.   famotidine 20 MG tablet Commonly known as:  PEPCID Take 40 mg by mouth at bedtime.   LORazepam 0.5 MG tablet Commonly known as:  ATIVAN Take 0.5 mg by mouth See admin instructions. Take 0.5 mg by mouth at bedtime and 0.5 mg one to two times a day as needed for anxiety   metroNIDAZOLE 500 MG tablet Commonly known as:  FLAGYL Take 1 tablet (500 mg total) by mouth every 8 (eight) hours.   ondansetron 8 MG disintegrating tablet Commonly known as:  ZOFRAN-ODT Take 1 tablet (8 mg total) by mouth every 8 (eight) hours as needed for nausea or vomiting.   simethicone 125 MG chewable tablet Commonly known as:  MYLICON Chew 125 mg by mouth every 6 (six) hours as needed for flatulence.   vancomycin 125 MG capsule Commonly known as:  VANCOCIN Take 1 capsule (125 mg total) by mouth 4 (four) times daily.   VITAMIN D-VITAMIN K PO Take 1 tablet by mouth daily.       Major procedures and Radiology Reports - PLEASE review detailed and final reports for all details, in brief -     Ct Abdomen Pelvis Wo Contrast  Result Date: 06/10/2018 CLINICAL DATA:  Acute abdominal pain. Abdominal pain for 1 week diarrhea. EXAM: CT ABDOMEN AND PELVIS WITHOUT CONTRAST TECHNIQUE: Multidetector CT imaging of the abdomen and pelvis was performed following the standard protocol without IV contrast. COMPARISON:  CT 06/05/2012 FINDINGS: Lower chest: Mild lingular scarring. No consolidation or pleural fluid. Hepatobiliary: No focal abnormality on  noncontrast CT. Gallbladder physiologically distended, no calcified stone. No biliary  dilatation. Pancreas: Not well evaluated due to lack contrast and adjacent unopacified bowel. Allowing for this, no pancreatic inflammation. No evidence of ductal dilatation. Spleen: Normal in size without focal abnormality. Adrenals/Urinary Tract: Stable 16 mm left adrenal adenoma. The right adrenal gland is normal. No perinephric edema. Bilateral extrarenal pelvis configuration of both kidneys, similar to prior exam. No frank hydronephrosis. Small cyst in the right kidney on prior is not well seen in the absence of contrast. Urinary bladder is physiologically distended. No bladder wall thickening. No urolithiasis. Stomach/Bowel: Bowel evaluation is limited in the absence of enteric contrast. The stomach is nondistended. Duodenal diverticulum as before without acute inflammation. Small bowel is nondistended, no obstruction. There is pan colonic wall thickening with mild pericolonic edema. Colon is tortuous. Appendix is tentatively identified and normal. Vascular/Lymphatic: Mild aorta bi-iliac atherosclerosis. No aneurysm. Lack contrast and paucity of intra-abdominal fat partially obscures evaluation for adenopathy. No bulky adenopathy is seen. Reproductive: Status post hysterectomy. No adnexal masses. Other: No free air, ascites, or intra-abdominal abscess. Musculoskeletal: There are no acute or suspicious osseous abnormalities. Degenerative change throughout spine. IMPRESSION: 1. Pancolitis, likely infectious or inflammatory. Consider C-diff. No perforation. 2. Chronic findings are unchanged, as described. 3.  Aortic Atherosclerosis (ICD10-I70.0). Electronically Signed   By: Narda Rutherford M.D.   On: 06/10/2018 23:51   Dg Abd 2 Views  Result Date: 06/13/2018 CLINICAL DATA:  Abdominal pain. Nausea, vomiting, diarrhea for 1 week. EXAM: ABDOMEN - 2 VIEW COMPARISON:  CT 3 days ago 06/10/2018 FINDINGS: No free intra-abdominal air. Fluid level in the ascending colon with loss of normal haustral in the descending  colon consistent with colitis, as seen on CT. No small bowel dilatation or obstruction. Pelvic phleboliths without radiopaque calculi. Included lung bases are clear. IMPRESSION: Findings consistent with colitis is seen on recent CT. No bowel obstruction or free air. Electronically Signed   By: Narda Rutherford M.D.   On: 06/13/2018 04:02    Micro Results    Recent Results (from the past 240 hour(s))  Urine Culture     Status: Abnormal   Collection Time: 06/11/18 12:00 AM  Result Value Ref Range Status   Specimen Description URINE, CLEAN CATCH  Final   Special Requests NONE  Final   Culture (A)  Final    <10,000 COLONIES/mL INSIGNIFICANT GROWTH Performed at [facility], [address] [place], [place] [address]    Report Status 06/12/2018 FINAL  Final  C difficile quick scan w PCR reflex     Status: Abnormal   Collection Time: 06/13/18  2:21 AM  Result Value Ref Range Status   C Diff antigen POSITIVE (A) NEGATIVE Final   C Diff toxin POSITIVE (A) NEGATIVE Final   C Diff interpretation Toxin producing C. difficile detected.  Final    Comment: CRITICAL RESULT CALLED TO, READ BACK BY AND VERIFIED WITH: Sherlon Handing RN [phone] 06/13/18 A BROWNING Performed at [facility], [address], [place], [place] [address]   Gastrointestinal Panel by PCR , Stool     Status: None   Collection Time: 06/13/18  2:21 AM  Result Value Ref Range Status   Campylobacter species NOT DETECTED NOT DETECTED Final   Plesimonas shigelloides NOT DETECTED NOT DETECTED Final   Salmonella species NOT DETECTED NOT DETECTED Final   Yersinia enterocolitica NOT DETECTED NOT DETECTED Final   Vibrio species NOT DETECTED NOT DETECTED Final   Vibrio  cholerae NOT DETECTED NOT DETECTED Final   Enteroaggregative E coli (EAEC) NOT DETECTED NOT DETECTED Final   Enteropathogenic E coli (EPEC) NOT DETECTED NOT DETECTED Final   Enterotoxigenic E coli (ETEC) NOT DETECTED NOT DETECTED Final   Shiga like toxin producing  E coli (STEC) NOT DETECTED NOT DETECTED Final   Shigella/Enteroinvasive E coli (EIEC) NOT DETECTED NOT DETECTED Final   Cryptosporidium NOT DETECTED NOT DETECTED Final   Cyclospora cayetanensis NOT DETECTED NOT DETECTED Final   Entamoeba histolytica NOT DETECTED NOT DETECTED Final   Giardia lamblia NOT DETECTED NOT DETECTED Final   Adenovirus F40/41 NOT DETECTED NOT DETECTED Final   Astrovirus NOT DETECTED NOT DETECTED Final   Norovirus GI/GII NOT DETECTED NOT DETECTED Final   Rotavirus A NOT DETECTED NOT DETECTED Final   Sapovirus (I, II, IV, and V) NOT DETECTED NOT DETECTED Final    Comment: Performed at [facility], [address] [place], [place] [address]       Today   Subjective    Shelley [name] today has no  Fever or chills.  No abdominal pain, no nausea, no vomiting        Patient has been seen and examined prior to discharge   Objective   Blood pressure 101/68, pulse 79, temperature 97.8 F (36.6 C), temperature source Oral, resp. rate 18, height 5\' 4"  (1.626 m), weight 59.7 kg, SpO2 96 %.   Intake/Output Summary (Last 24 hours) at 06/14/2018 1714 Last data filed at 06/14/2018 1500 Gross per 24 hour  Intake 3420.72 ml  Output 6 ml  Net 3414.72 ml    Exam Gen:- Awake , comfortable HEENT:- [place].AT,  Mucous membranes moist Neck-Supple Neck,No JVD,  Lungs- mostly clear CV- S1, S2 normal Abd-  +ve B.Sounds, Abd Soft, No tenderness,    Extremity/Skin:- Intact peripheral pulses     Data Review   CBC w Diff:  Lab Results  Component Value Date   WBC 6.9 06/14/2018   HGB 9.8 (L) 06/14/2018   HCT 30.2 (L) 06/14/2018   PLT 291 06/14/2018   LYMPHOPCT 20 06/14/2018   MONOPCT 9 06/14/2018   EOSPCT 5 06/14/2018   BASOPCT 0 06/14/2018    CMP:  Lab Results  Component Value Date   NA 141 06/14/2018   K 4.0 06/14/2018   CL 110 06/14/2018   CO2 25 06/14/2018   BUN <5 (L) 06/14/2018   CREATININE 0.67 06/14/2018   PROT 5.1 (L) 06/14/2018    ALBUMIN 2.4 (L) 06/14/2018   BILITOT 0.4 06/14/2018   ALKPHOS 54 06/14/2018   AST 26 06/14/2018   ALT 22 06/14/2018  .   Total Discharge time is about 33 minutes  Jackie Plum M.D on 06/14/2018 at 5:14 PM  [facility]   Office  [phone]  Dragon dictation system was used to create this note, attempts have been made to correct errors, however presence of uncorrected errors is not a reflection quality of care provided

## 2018-06-14 NOTE — Progress Notes (Signed)
Patient discharged to home with instructions. 

## 2018-07-09 ENCOUNTER — Ambulatory Visit: Payer: Medicare Other | Admitting: Physical Therapy

## 2018-11-12 IMAGING — RF DG ESOPHAGUS
7 of 8 series · 14 of 21 positions shown · non-contrast
Comparison: None.

CLINICAL DATA: History of reflux and esophagitis

EXAM:
ESOPHOGRAM / BARIUM SWALLOW / BARIUM TABLET STUDY
TECHNIQUE: Combined double contrast and single contrast examination performed
using effervescent crystals, thick barium liquid, and thin barium
liquid. The patient was observed with fluoroscopy swallowing a 13 mm
barium sulphate tablet.
FLUOROSCOPY TIME:  Fluoroscopy Time:  1 minutes 6 seconds
Radiation Exposure Index (if provided by the fluoroscopic device):
49 mGy
Number of Acquired Spot Images: 0

[Series 1: sequence · 2 of 12 frames shown (1 of 5)]
[frame 2/12]
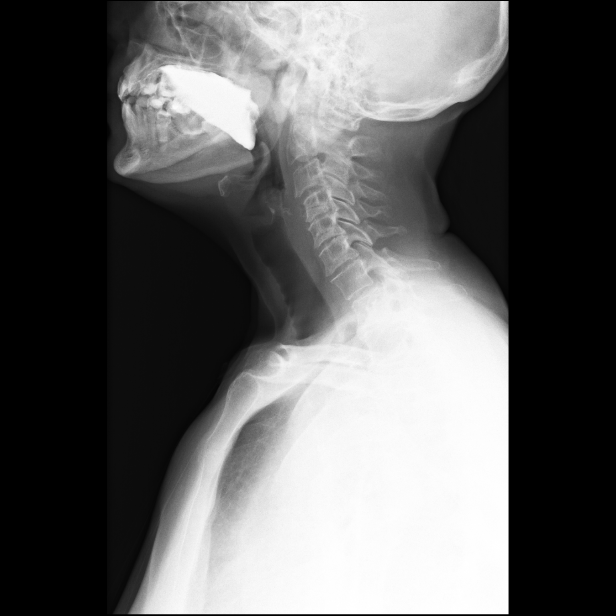
[frame 11/12]
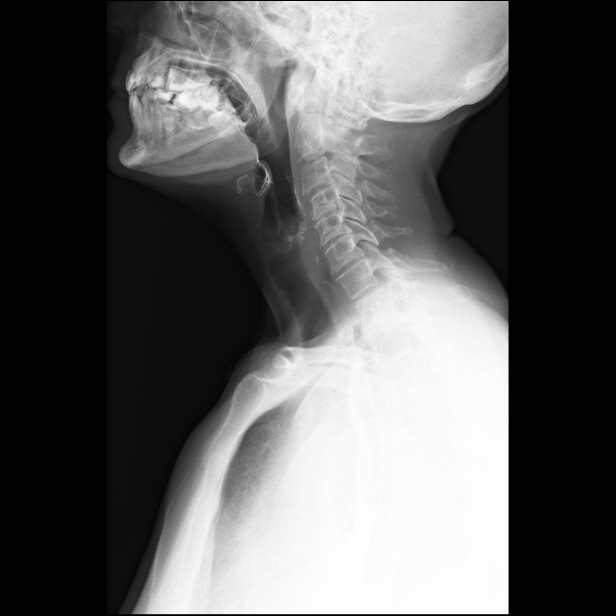

[Series 2: sequence · 3 of 24 frames shown (2 of 5)]
[frame 4/24]
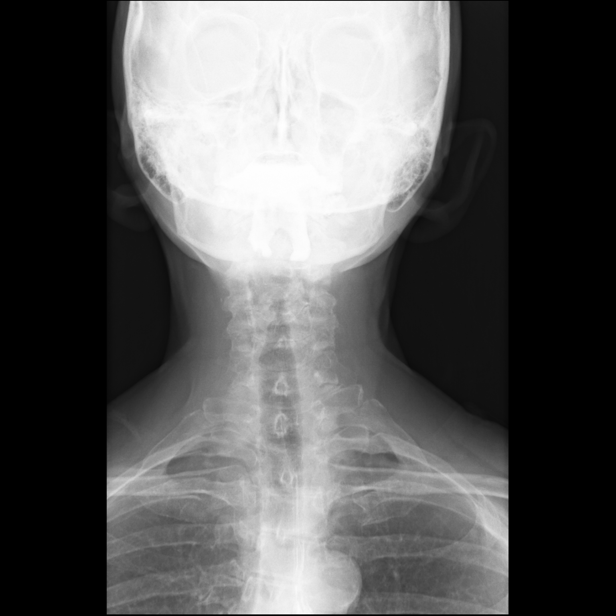
[frame 13/24]
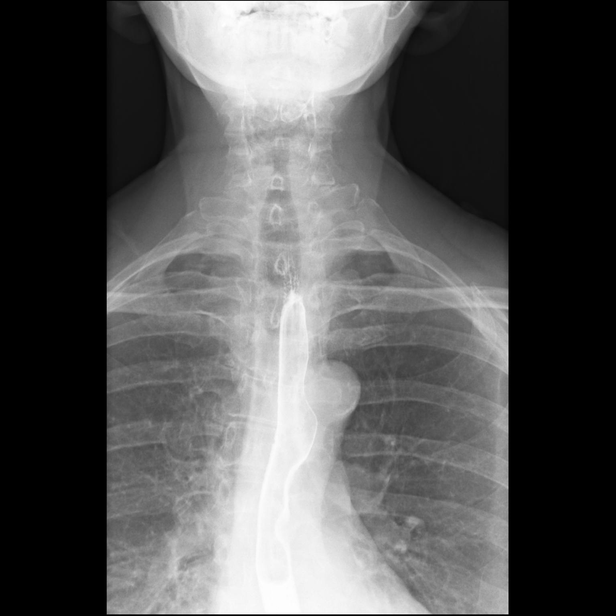
[frame 21/24]
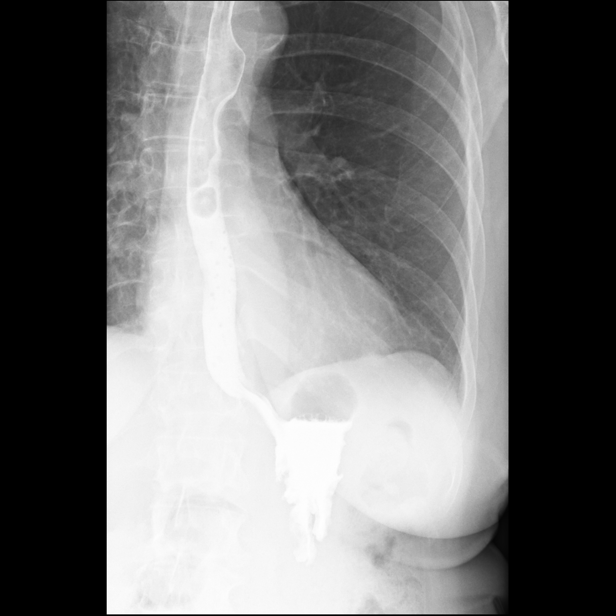

[Series 3: sequence · 2 of 8 frames shown (3 of 5)]
[frame 5/8]
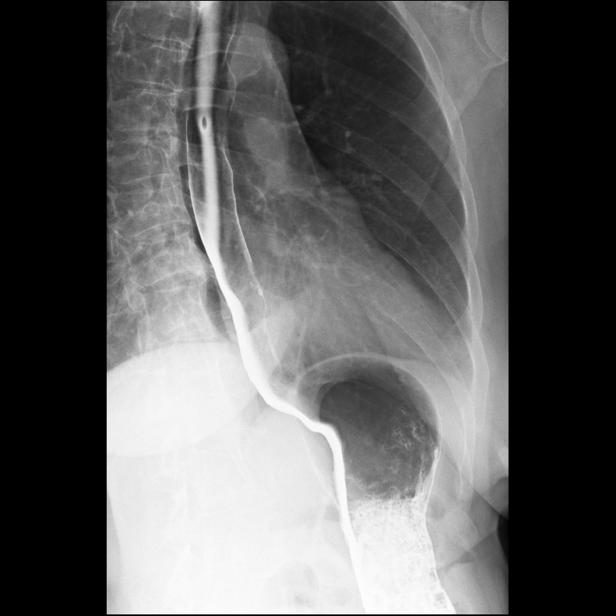
[frame 7/8]
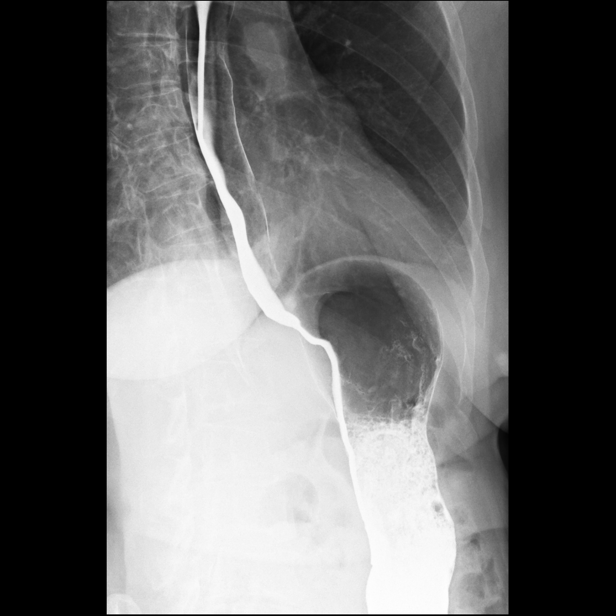

[Series 4: sequence · 2 of 17 frames shown (4 of 5)]
[frame 4/17]
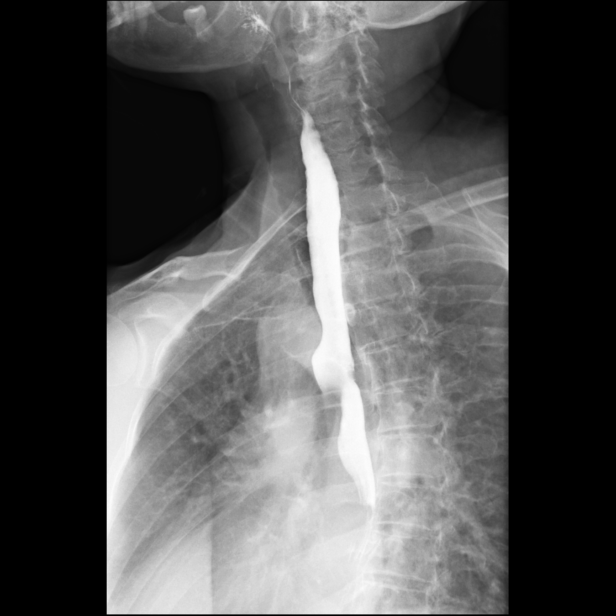
[frame 9/17]
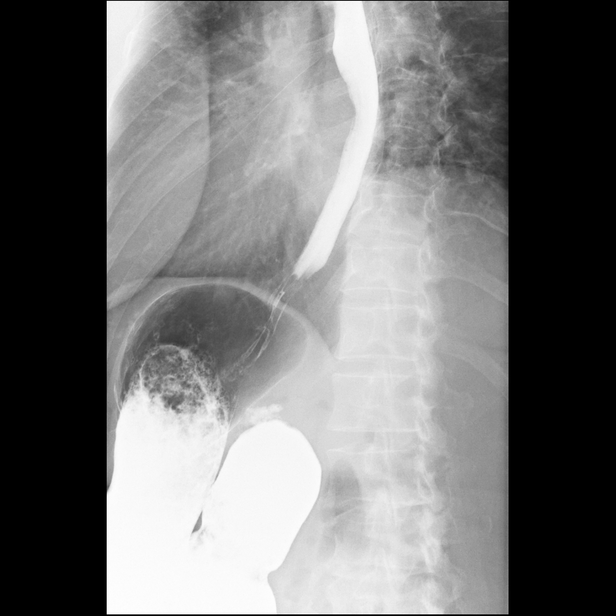

[Series 5: sequence · 3 of 46 frames shown (5 of 5)]
[frame 7/46]
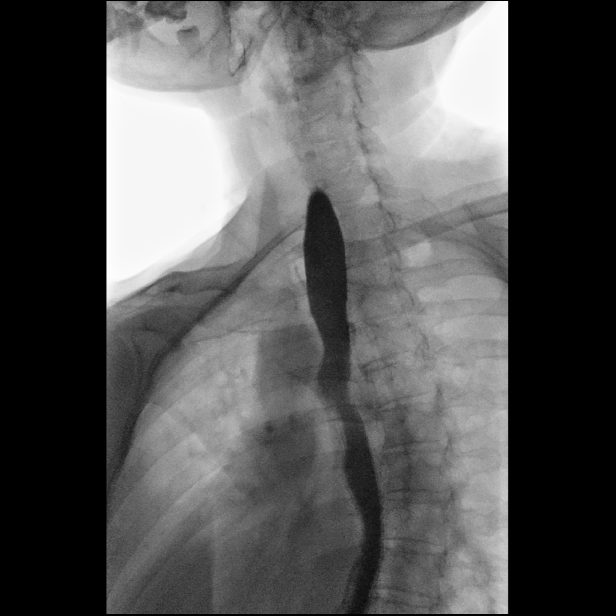
[frame 17/46]
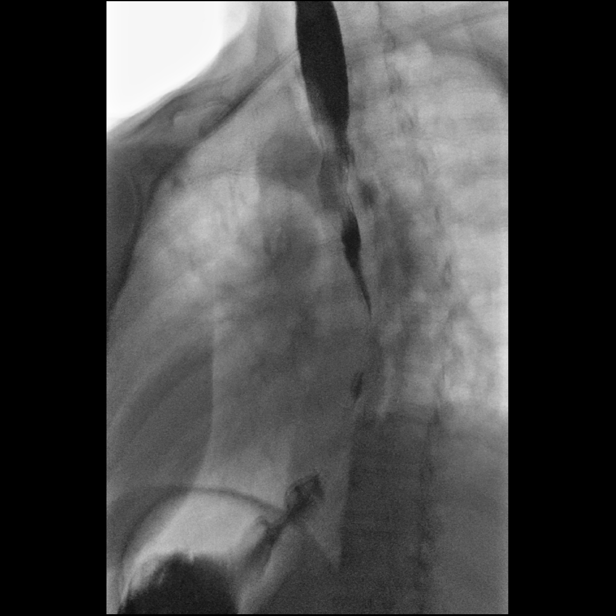
[frame 40/46]
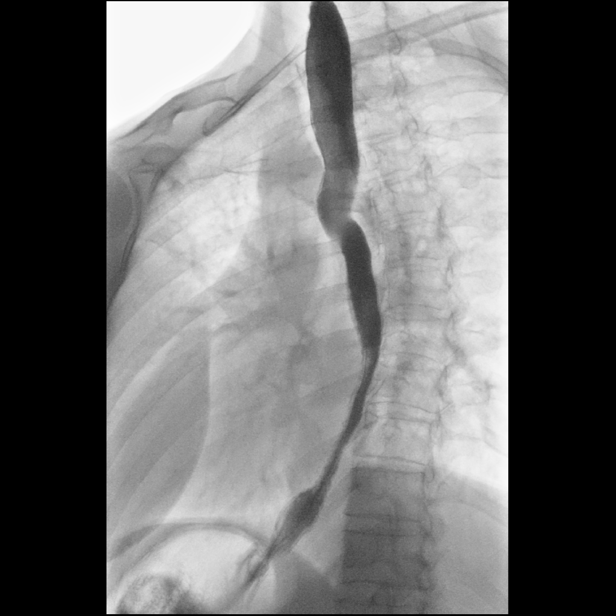

[Series 6: one shot · 1 of 1 slices shown (1 of 2)]
[im 1/1]
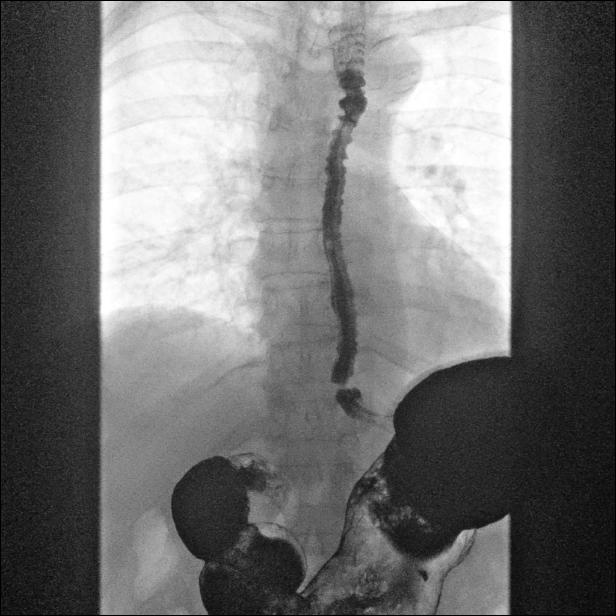

[Series 8: one shot · 1 of 1 slices shown (2 of 2)]
[im 1/1]
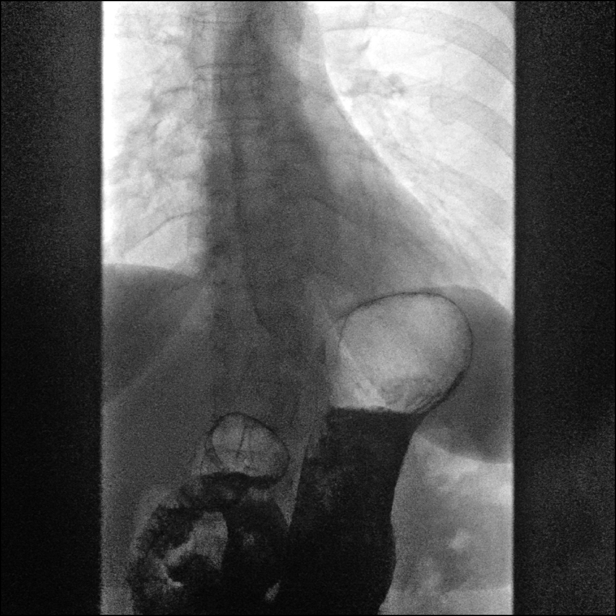

[14 of 21 positions shown; findings below may reference images not displayed]

FINDINGS: Initially a single contrast study was performed with rapid sequence
spot films of the cervical esophagus obtained. The swallowing
mechanism is unremarkable. There is slight prominence of the
cricopharyngeus muscle noted. No aspiration or penetration is seen.
A double-contrast study then shows the mucosa of the esophagus to be
unremarkable. No hiatal hernia is seen. However, moderate tertiary
contractions are noted in the mid and distal esophagus on delayed
images. In addition there is moderate gastroesophageal reflux
present. A barium pill was given at the end of the study which
passed into the stomach without delay.
IMPRESSION: 1. Moderate gastroesophageal reflux. Barium pill passes into the
stomach without delay. No hiatal hernia.
2. Moderate tertiary contractions in the mid and distal esophagus.

## 2019-05-04 IMAGING — CT CT ABD-PELV W/O CM
2 of 4 series · 16 of 46 positions shown, 18 images · non-contrast
Comparison: CT 06/05/2012

CLINICAL DATA: Acute abdominal pain. Abdominal pain for 1 week
diarrhea.

EXAM:
CT ABDOMEN AND PELVIS WITHOUT CONTRAST
TECHNIQUE: Multidetector CT imaging of the abdomen and pelvis was performed
following the standard protocol without IV contrast.

[Series 3: ap without · axial · non-contrast · 0.75mm/px · z∈[+734,+1144]mm · 13 of 92 slices shown, 15 images]
[im 5/92  soft-tissue]
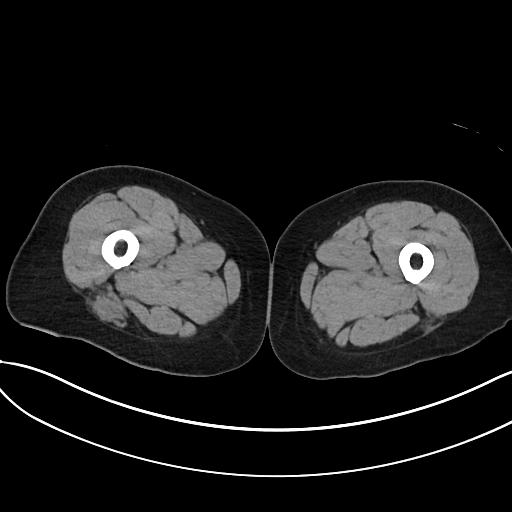
[im 5/92  bone]
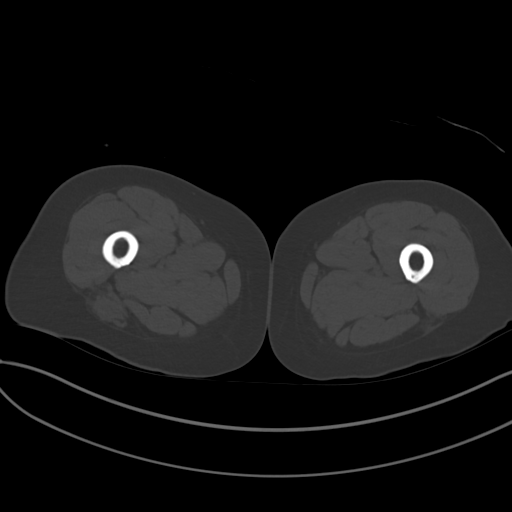
[im 15/92  soft-tissue]
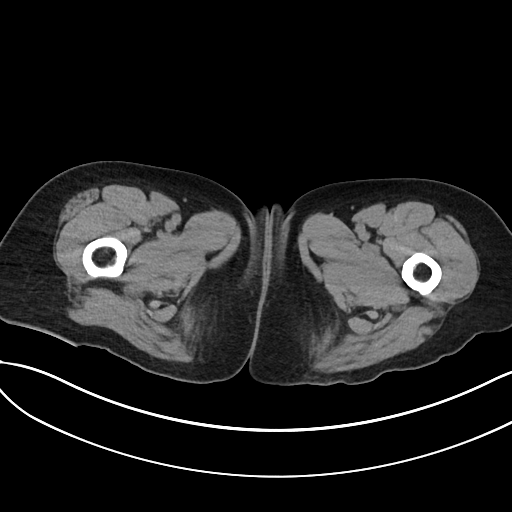
[im 20/92  soft-tissue]
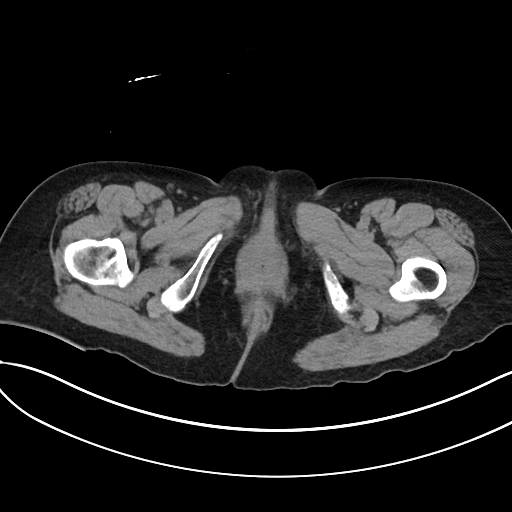
[im 24/92  soft-tissue]
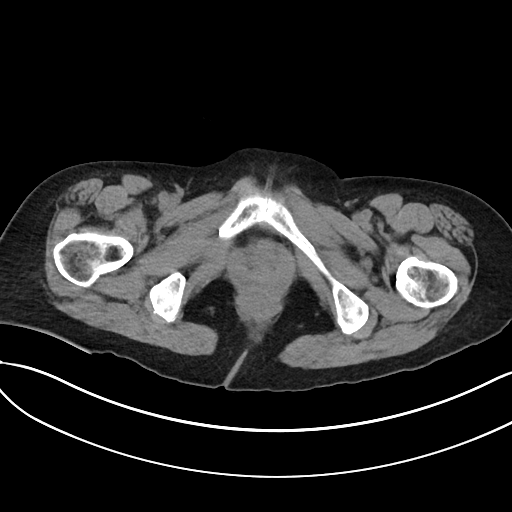
[im 34/92  soft-tissue]
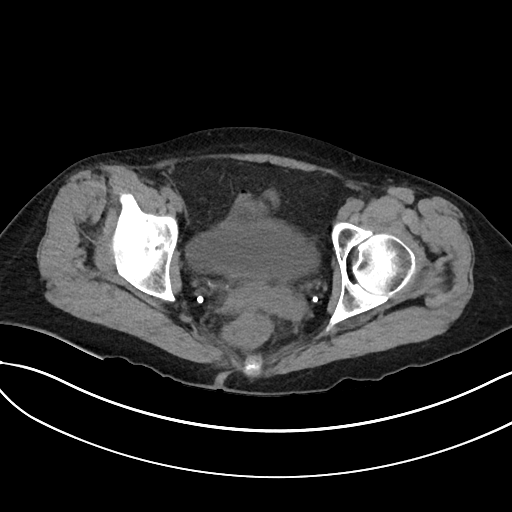
[im 39/92  soft-tissue]
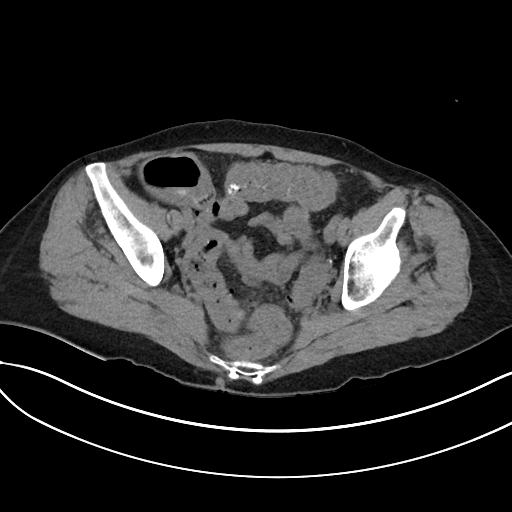
[im 48/92  soft-tissue]
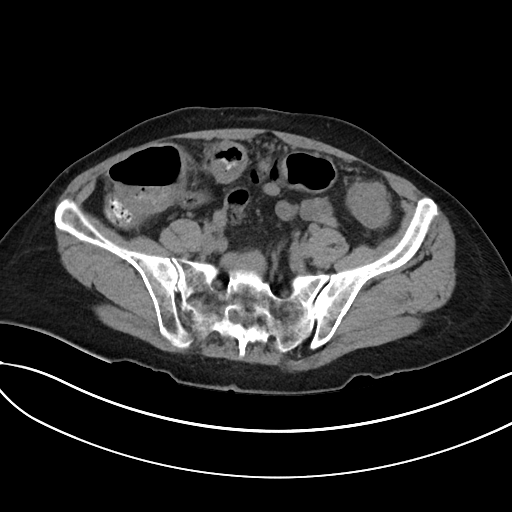
[im 53/92  soft-tissue]
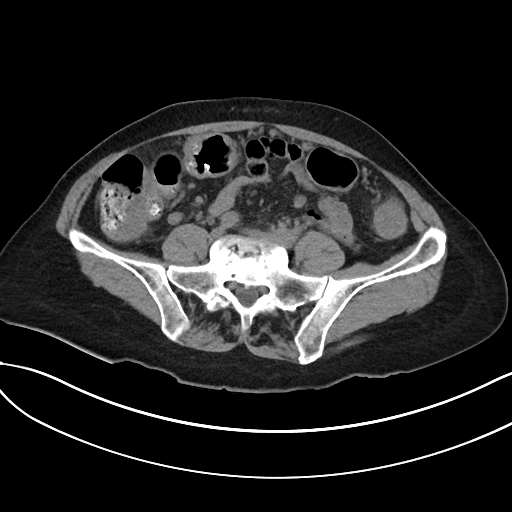
[im 58/92  soft-tissue]
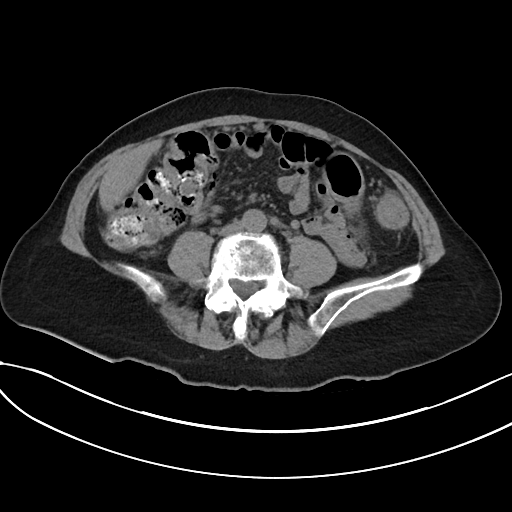
[im 58/92  bone]
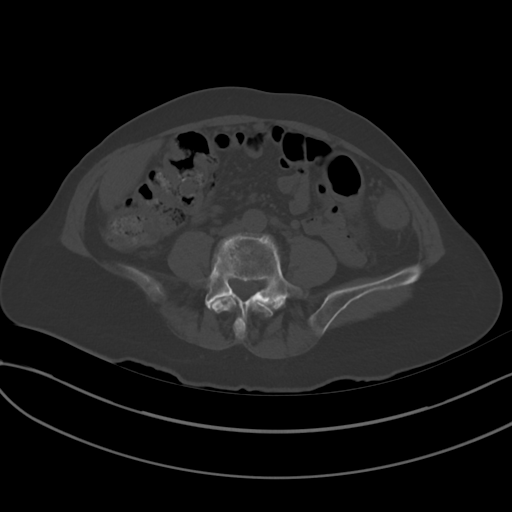
[im 68/92  soft-tissue]
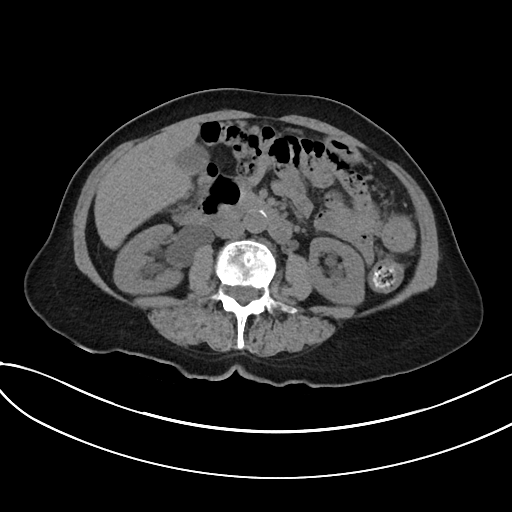
[im 72/92  soft-tissue]
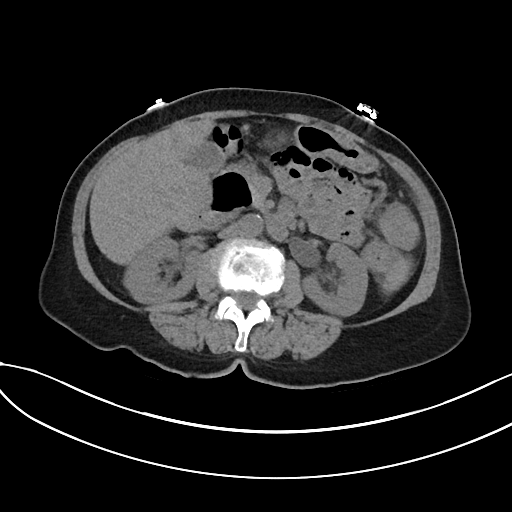
[im 77/92  soft-tissue]
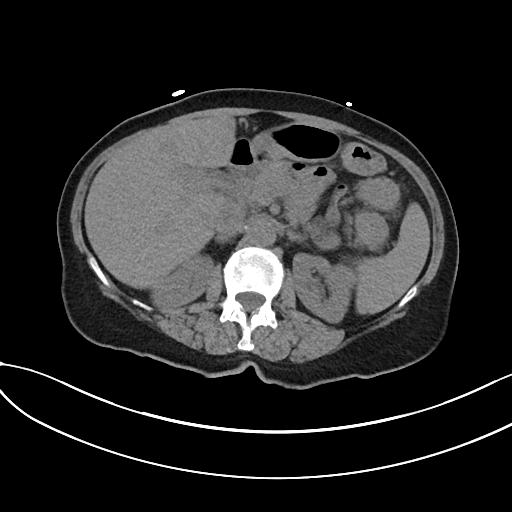
[im 87/92  soft-tissue]
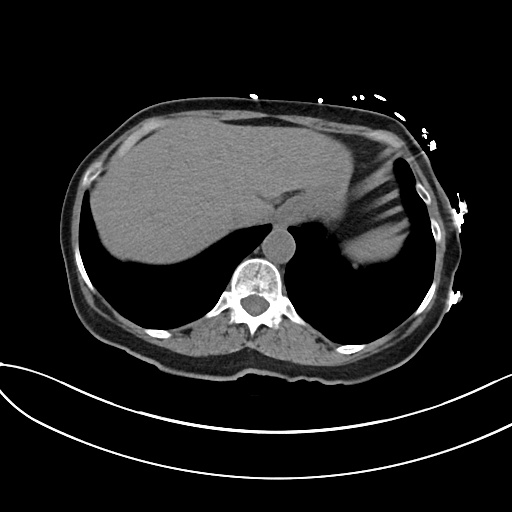

[Series 6: cor · coronal · 0.74mm/px · 3 of 81 slices shown]
[im 27/81  soft-tissue]
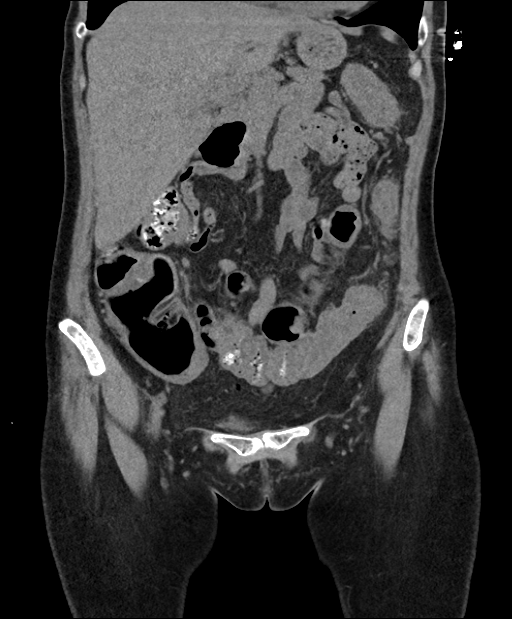
[im 36/81  soft-tissue]
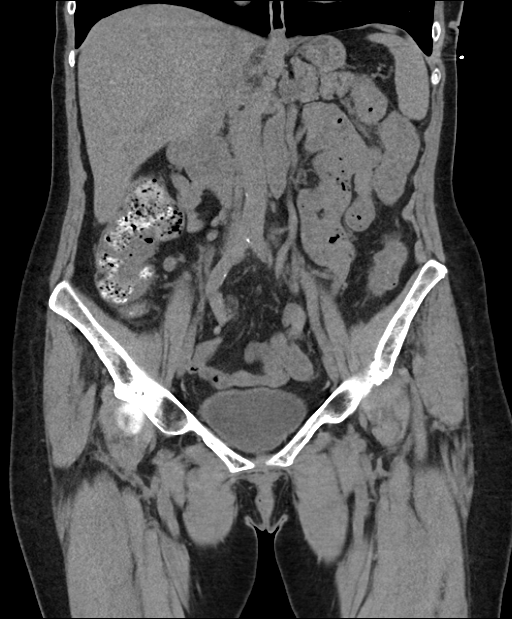
[im 45/81  soft-tissue]
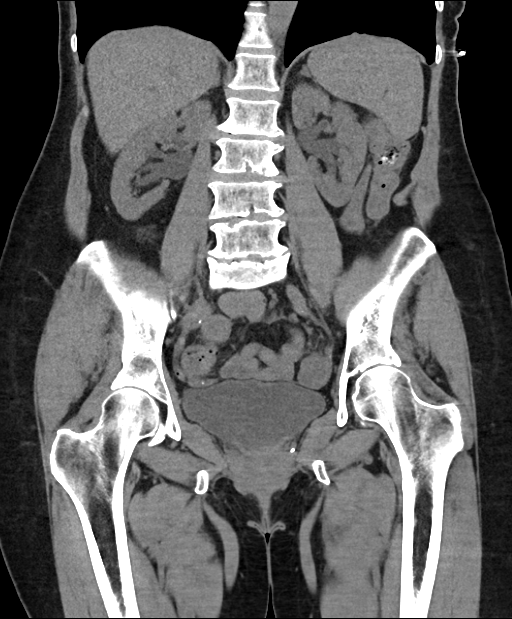

[16 of 46 positions shown; findings below may reference images not displayed]

FINDINGS: Lower chest: Mild lingular scarring. No consolidation or pleural
fluid.

Hepatobiliary: No focal abnormality on noncontrast CT. Gallbladder
physiologically distended, no calcified stone. No biliary
dilatation.

Pancreas: Not well evaluated due to lack contrast and adjacent
unopacified bowel. Allowing for this, no pancreatic inflammation. No
evidence of ductal dilatation.

Spleen: Normal in size without focal abnormality.

Adrenals/Urinary Tract: Stable 16 mm left adrenal adenoma. The right
adrenal gland is normal. No perinephric edema. Bilateral extrarenal
pelvis configuration of both kidneys, similar to prior exam. No
frank hydronephrosis. Small cyst in the right kidney on prior is not
well seen in the absence of contrast. Urinary bladder is
physiologically distended. No bladder wall thickening. No
urolithiasis.

Stomach/Bowel: Bowel evaluation is limited in the absence of enteric
contrast. The stomach is nondistended. Duodenal diverticulum as
before without acute inflammation. Small bowel is nondistended, no
obstruction. There is pan colonic wall thickening with mild
pericolonic edema. Colon is tortuous. Appendix is tentatively
identified and normal.

Vascular/Lymphatic: Mild aorta bi-iliac atherosclerosis. No
aneurysm. Lack contrast and paucity of intra-abdominal fat partially
obscures evaluation for adenopathy. No bulky adenopathy is seen.

Reproductive: Status post hysterectomy. No adnexal masses.

Other: No free air, ascites, or intra-abdominal abscess.

Musculoskeletal: There are no acute or suspicious osseous
abnormalities. Degenerative change throughout spine.
IMPRESSION: 1. Pancolitis, likely infectious or inflammatory. Consider C-diff.
No perforation.
2. Chronic findings are unchanged, as described.
3.  Aortic Atherosclerosis ([id number]).

## 2020-11-16 ENCOUNTER — Other Ambulatory Visit: Payer: Self-pay | Admitting: Family

## 2020-11-16 DIAGNOSIS — J984 Other disorders of lung: Secondary | ICD-10-CM

## 2020-11-21 ENCOUNTER — Other Ambulatory Visit: Payer: Self-pay | Admitting: Family Medicine

## 2020-11-21 DIAGNOSIS — Z1231 Encounter for screening mammogram for malignant neoplasm of breast: Secondary | ICD-10-CM

## 2020-12-03 ENCOUNTER — Other Ambulatory Visit: Payer: Medicare Other

## 2020-12-15 ENCOUNTER — Ambulatory Visit (HOSPITAL_COMMUNITY)
Admission: EM | Admit: 2020-12-15 | Discharge: 2020-12-15 | Disposition: A | Discharge status: Home / Self Care | Payer: Medicare (Managed Care) | Attending: Emergency Medicine | Admitting: Emergency Medicine

## 2020-12-15 ENCOUNTER — Ambulatory Visit (INDEPENDENT_AMBULATORY_CARE_PROVIDER_SITE_OTHER): Payer: Medicare (Managed Care)

## 2020-12-15 ENCOUNTER — Other Ambulatory Visit: Payer: Self-pay

## 2020-12-15 ENCOUNTER — Encounter (HOSPITAL_COMMUNITY): Payer: Self-pay

## 2020-12-15 DIAGNOSIS — K5901 Slow transit constipation: Secondary | ICD-10-CM

## 2020-12-15 DIAGNOSIS — M545 Low back pain, unspecified: Secondary | ICD-10-CM

## 2020-12-15 LAB — POCT URINALYSIS DIPSTICK, ED / UC
Bilirubin Urine: NEGATIVE
Glucose, UA: NEGATIVE mg/dL
Hgb urine dipstick: NEGATIVE
Ketones, ur: NEGATIVE mg/dL
Leukocytes,Ua: NEGATIVE
Nitrite: NEGATIVE
Protein, ur: NEGATIVE mg/dL
Specific Gravity, Urine: <=1.005 (ref 1.005–1.030)
Urobilinogen, UA: 0.2 mg/dL (ref 0.0–1.0)
pH: 5 (ref 5.0–8.0)

## 2020-12-15 MED ORDER — POLYETHYLENE GLYCOL 3350 17 G PO PACK: 17.0000 g | PACK | Freq: Every day | ORAL | 0 refills | Status: DC

## 2020-12-15 MED ORDER — DICLOFENAC SODIUM 1 % EX GEL: 2.0000 g | Freq: Four times a day (QID) | CUTANEOUS | 0 refills | Status: DC

## 2020-12-15 NOTE — Discharge Instructions (Addendum)
Can use MiraLAX once daily, Empty packet contents into water and juice, and is diarrhea occurs decreased use to every other day or as needed  ' Can use diclofenac gel 4 times a day as needed, rub into affected area  Can use heating pad or ice pack in 15-minute intervals as needed for additional comfort

## 2020-12-15 NOTE — ED Provider Notes (Signed)
[facility]    CSN: [id number] Arrival date & time: 12/15/20  1514      History   Chief Complaint Chief Complaint  Patient presents with  . Back Pain    HPI Shelley [name] is a 75 y.o. female.   Patient presents with right lower back pain that intermittently radiates to center of back for 1 week. Feels like a knot is sitting there. Sometimes worsened when lying sometimes worsened with standing. ROM intact.  Denies numbness and tingling.  Denies precipitating event.  Has history of a pulmonary nodule in which she was supposed to get CT scan done today.  Scan not improved by insurance therefore she has declined to do it.  Concerned back pain may be related to nodule.  Concerned back pain may be related to constipation.  Has history of slow transit constipation typically treated with high-fiber diet.  Has had 2 small bowel movements over the last week but describes as small hard balls.  Took 2 stool softeners last night into stool softener and laxative mix this morning with no relief.  Years ago when she had severe back pain it was due to constipation.  Denies increased gas production, abdominal, fever, chills.   Past Medical History:  Diagnosis Date  . Anxiety   . Arthritis    osteoarthritis- back, knees, hands  . Chronic fatigue syndrome   . Complication of anesthesia    had decreased temp  . Depression   . Diverticulosis   . GERD (gastroesophageal reflux disease)   . History of hiatal hernia   . IBS (irritable bowel syndrome)   . Internal hemorrhoids   . Seizures (H[facility])    migraines    Patient Active Problem List   Diagnosis Date Noted  . Colitis 06/13/2018  . Depression with anxiety 06/13/2018  . Hyponatremia 06/13/2018  . DYSURIA 06/30/2010  . INCOMPLETE VOIDING 02/28/2010  . RLQ PAIN 02/28/2010  . THYROID STIMULATING HORMONE, ABNORMAL 11/17/2009  . UNSPECIFIED VITAMIN D DEFICIENCY 11/17/2009  . FATIGUE 11/17/2009  . LUQ PAIN 11/14/2009  . CHANGE IN  BOWELS 10/07/2009  . 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 BLADDER PROLAPSE 04/24/2007  . SYMPTOM, ENLARGEMENT, LYMPH NODES 04/24/2007  . GASTRITIS 01/03/2007  . OSTEOARTHROSIS NOS, HAND 10/23/2006  . DEPRESSION, MAJOR, RECURRENT 10/17/2006  . ANXIETY 10/17/2006  . HEMORRHOIDS, NOS 10/17/2006  . GERD (gastroesophageal reflux disease) 10/17/2006  . DIVERTICULITIS OF COLON, NOS 10/17/2006  . IRRITABLE BOWEL SYNDROME 10/17/2006    Past Surgical History:  Procedure Laterality Date  . ABDOMINAL HYSTERECTOMY    . BLADDER SUSPENSION     Prolasped bladder/mesh: revision surgeon -[facility] "remains with retention and constipation"needing more surgery"  . BUNIONECTOMY     bilateral  . CATARACT EXTRACTION, BILATERAL Bilateral   . COLONOSCOPY WITH PROPOFOL N/A 01/05/2015   Procedure: COLONOSCOPY WITH PROPOFOL;  Surgeon: Willis Modena, MD;  Location: [facility];  Service: Endoscopy;  Laterality: N/A;  . ESOPHAGOGASTRODUODENOSCOPY (EGD) WITH PROPOFOL N/A 01/05/2015   Procedure: ESOPHAGOGASTRODUODENOSCOPY (EGD) WITH PROPOFOL;  Surgeon: Willis Modena, MD;  Location: [facility];  Service: Endoscopy;  Laterality: N/A;  . FACELIFT W/BLEPHAROPLASTY    . NASAL RECONSTRUCTION    . THERAPEUTIC ABORTION     '93    OB History   No obstetric history on file.      Home Medications    Prior to Admission medications   Medication Sig Start Date End Date Taking? Authorizing Provider  acetaminophen (TYLENOL) 500 MG tablet  Take 1,000 mg by mouth every 8 (eight) hours as needed (for headaches).     [provider]  amitriptyline (ELAVIL) 150 MG tablet Take 150 mg by mouth at bedtime.      [provider]  bismuth subsalicylate (PEPTO BISMOL) 262 MG chewable tablet Chew 524 mg by mouth 3 (three) times daily as needed (for stomach pain).     [provider]  dexlansoprazole (DEXILANT) 60 MG capsule Take 1 capsule (60 mg total) by mouth daily. Patient not  taking: Reported on 06/10/2018 06/03/12   Shelley, Amy S, PA-C  famotidine (PEPCID) 20 MG tablet Take 40 mg by mouth at bedtime.  03/17/18   [provider]  LORazepam (ATIVAN) 0.5 MG tablet Take 0.5 mg by mouth See admin instructions. Take 0.5 mg by mouth at bedtime and 0.5 mg one to two times a day as needed for anxiety    [provider]  metroNIDAZOLE (FLAGYL) 500 MG tablet Take 1 tablet (500 mg total) by mouth every 8 (eight) hours. 06/14/18   Shelley Plum, MD  ondansetron (ZOFRAN ODT) 8 MG disintegrating tablet Take 1 tablet (8 mg total) by mouth every 8 (eight) hours as needed for nausea or vomiting. 06/10/18   Shelley Laine, MD  simethicone (MYLICON) 125 MG chewable tablet Chew 125 mg by mouth every 6 (six) hours as needed for flatulence.     [provider]  vancomycin (VANCOCIN HCL) 125 MG capsule Take 1 capsule (125 mg total) by mouth 4 (four) times daily. 06/11/18   Shelley Laine, MD  VITAMIN D-VITAMIN K PO Take 1 tablet by mouth daily.    [provider]    Family History Family History  Problem Relation Age of Onset  . Emphysema Mother        Died at 67  . Heart failure Father   . Hypertension Father   . Diabetes type II Brother   . Alcohol abuse Sister        x2  . Alcohol abuse Brother        x3    Social History Social History   Tobacco Use  . Smoking status: Never Smoker  . Smokeless tobacco: Never Used  Vaping Use  . Vaping Use: Never used  Substance Use Topics  . Alcohol use: No  . Drug use: No     Allergies   Nitrofurantoin, Penicillins, Aspirin, Chlordiazepoxide, Chlordiazepoxide hcl, Demerol [meperidine hcl], Meperidine, Povidone iodine, Povidone-iodine, Lactose, Meperidine hcl, Milk-related compounds, Nexium [esomeprazole], Omeprazole, Amoxicillin, Codeine, Iodinated diagnostic agents, Lactalbumin, and Whey   Review of Systems Review of Systems  Defer to HPI   Physical Exam Triage Vital Signs ED Triage  Vitals  Enc Vitals Group     BP 12/15/20 1557 97/68     Pulse Rate 12/15/20 1557 80     Resp 12/15/20 1557 17     Temp 12/15/20 1557 97.7 F (36.5 C)     Temp Source 12/15/20 1557 Oral     SpO2 12/15/20 1557 98 %     Weight --      Height --      Head Circumference --      Peak Flow --      Pain Score 12/15/20 1556 7     Pain Loc --      Pain Edu? --      Excl. in GC? --    No data found.  Updated Vital Signs BP 97/68 (BP Location: Right Arm)  Pulse 80   Temp 97.7 F (36.5 C) (Oral)   Resp 17   SpO2 98%   Visual Acuity Right Eye Distance:   Left Eye Distance:   Bilateral Distance:    Right Eye Near:   Left Eye Near:    Bilateral Near:     Physical Exam Constitutional:      Appearance: Normal appearance. She is normal weight.  HENT:     Head: Normocephalic.  Eyes:     Extraocular Movements: Extraocular movements intact.  Pulmonary:     Effort: Pulmonary effort is normal.  Musculoskeletal:     Cervical back: Normal.     Thoracic back: Normal.     Lumbar back: Tenderness present. No swelling or bony tenderness. Normal range of motion.       Back:  Skin:    General: Skin is warm and dry.  Neurological:     General: No focal deficit present.     Mental Status: She is alert and oriented to person, place, and time. Mental status is at baseline.  Psychiatric:        Mood and Affect: Mood normal.        Behavior: Behavior normal.        Thought Content: Thought content normal.        Judgment: Judgment normal.      UC Treatments / Results  Labs (all labs ordered are listed, but only abnormal results are displayed) Labs Reviewed  POCT URINALYSIS DIPSTICK, ED / UC    EKG   Radiology No results found.  Procedures Procedures (including critical care time)  Medications Ordered in UC Medications - No data to display  Initial Impression / Assessment and Plan / UC Course  I have reviewed the triage vital signs and the nursing notes.  Pertinent  labs & imaging results that were available during my care of the patient were reviewed by me and considered in my medical decision making (see chart for details).  Acute right sided low back pain Constipation  1. miralax 17 g daily prn 2. Diclofenac gel 2g  4 times a day prn 3. Follow up with PCP for persistent symptoms  4. Xray lumbar spine- degenerative disc disease, negative for acute disease  Final Clinical Impressions([name]) / UC Diagnoses   Final diagnoses:  None   Discharge Instructions   None    ED Prescriptions    None     PDMP not reviewed this encounter.   Valinda Hoar, NP 12/15/20 1735

## 2020-12-15 NOTE — ED Triage Notes (Signed)
Pt c/o right back pain that moves to the center of her back X 1 week. She states lying down helps relieve the pain a little. She states she has not taken pain medicine.

## 2021-01-11 ENCOUNTER — Ambulatory Visit: Payer: Medicare Other

## 2021-06-27 ENCOUNTER — Encounter: Payer: Self-pay | Admitting: Family Medicine

## 2021-06-27 ENCOUNTER — Other Ambulatory Visit: Payer: Self-pay

## 2021-06-27 ENCOUNTER — Ambulatory Visit (INDEPENDENT_AMBULATORY_CARE_PROVIDER_SITE_OTHER): Payer: Medicare Other | Admitting: Family Medicine

## 2021-06-27 VITALS — BP 113/62 | HR 82 | Temp 97.7°F | Ht 63.0 in | Wt 148.2 lb

## 2021-06-27 DIAGNOSIS — F418 Other specified anxiety disorders: Secondary | ICD-10-CM | POA: Diagnosis not present

## 2021-06-27 DIAGNOSIS — L4050 Arthropathic psoriasis, unspecified: Secondary | ICD-10-CM

## 2021-06-27 MED ORDER — PREDNISONE 10 MG PO TABS: ORAL_TABLET | ORAL | 0 refills | Status: DC

## 2021-06-27 NOTE — Progress Notes (Addendum)
Subjective:  Patient ID: Shelley [name], female    DOB: 07/26/1946  Age: 75 y.o. MRN: [id number]  CC: New Patient (Initial Visit)   HPI Shelley [name] presents for arthritis all over. Neck, Thumbs, lower back. I feel heavy when I get up in the morning. Also stiff. Sx are long - term, chronic with intermittent exacerbations.   Has Thick skin at the end of her toes and under nails. Works on them constantly to prevent continued plaque underneath. Has ben dx with psroriasis. Fungus has been ruled out via culture in the past.  Hs had psoriatic plaques in past. None presently, but have been noted in the scalp, at knees and feet in the past.     History Shelley [name] has a past medical history of Anxiety, Arthritis, Chronic fatigue syndrome, Complication of anesthesia, Depression, Diverticulosis, GERD (gastroesophageal reflux disease), History of hiatal hernia, IBS (irritable bowel syndrome), Internal hemorrhoids, and Seizures ([place]).   She has a past surgical history that includes Abdominal hysterectomy; Bladder suspension; Cataract extraction, bilateral (Bilateral); Bunionectomy; Therapeutic abortion; Nasal reconstruction; Facelift w/blepharoplasty; Esophagogastroduodenoscopy (egd) with propofol (N/A, 01/05/2015); and Colonoscopy with propofol (N/A, 01/05/2015).   Her family history includes Alcohol abuse in her brother and sister; Diabetes type II in her brother; Emphysema in her mother; Heart failure in her father; Hypertension in her father.She reports that she has never smoked. She has never used smokeless tobacco. She reports that she does not drink alcohol and does not use drugs.    ROS Review of Systems  Constitutional: Negative.   HENT:  Negative for congestion.   Eyes:  Negative for visual disturbance.  Respiratory:  Negative for shortness of breath.   Cardiovascular:  Negative for chest pain.  Gastrointestinal:  Negative for abdominal pain, constipation, diarrhea, nausea and vomiting.   Genitourinary:  Negative for difficulty urinating.  Musculoskeletal:  Negative for arthralgias and myalgias.  Neurological:  Negative for headaches.  Psychiatric/Behavioral:  Negative for sleep disturbance.    Objective:  BP 113/62   Pulse 82   Temp 97.7 F (36.5 C)   Ht '5\' 3"'  (1.6 m)   Wt 148 lb 3.2 oz (67.2 kg)   SpO2 96%   BMI 26.25 kg/m   BP Readings from Last 3 Encounters:  06/27/21 113/62  12/15/20 97/68  06/14/18 101/68    Wt Readings from Last 3 Encounters:  06/27/21 148 lb 3.2 oz (67.2 kg)  06/13/18 131 lb 9.8 oz (59.7 kg)  06/10/18 130 lb (59 kg)     Physical Exam Constitutional:      General: She is not in acute distress.    Appearance: She is well-developed.  HENT:     Head: Normocephalic and atraumatic.  Eyes:     Conjunctiva/sclera: Conjunctivae normal.     Pupils: Pupils are equal, round, and reactive to light.  Neck:     Thyroid: No thyromegaly.  Cardiovascular:     Rate and Rhythm: Normal rate and regular rhythm.     Heart sounds: Normal heart sounds. No murmur heard. Pulmonary:     Effort: Pulmonary effort is normal. No respiratory distress.     Breath sounds: Normal breath sounds. No wheezing or rales.  Abdominal:     General: Bowel sounds are normal. There is no distension.     Palpations: Abdomen is soft.     Tenderness: There is no abdominal tenderness.  Musculoskeletal:        General: Normal range of motion.     Cervical  back: Normal range of motion and neck supple.  Lymphadenopathy:     Cervical: No cervical adenopathy.  Skin:    General: Skin is warm and dry.     Findings: Lesion (minimal onycholysis of first great nail both feet) present.  Neurological:     Mental Status: She is alert and oriented to person, place, and time.  Psychiatric:        Behavior: Behavior normal.        Thought Content: Thought content normal.        Judgment: Judgment normal.      Assessment & Plan:   Shelley [name] was seen today for new patient  (initial visit).  Diagnoses and all orders for this visit:  Psoriatic arthritis ([place]) -     CBC with Differential/Platelet -     CMP14+EGFR -     Sedimentation rate -     Rheumatoid factor -     Rheumatoid Arthritis Profile -     Ambulatory referral to Rheumatology  Depression with anxiety  Other orders -     predniSONE (DELTASONE) 10 MG tablet; Take 5 daily for 3 days followed by 4,3,2 and 1 for 3 days each.      I have discontinued Greggory Stallion. Blandon's dexlansoprazole, VITAMIN D-VITAMIN K PO, ondansetron, vancomycin, metroNIDAZOLE, and diclofenac Sodium. I am also having her start on predniSONE. Additionally, I am having her maintain her amitriptyline, LORazepam, bismuth subsalicylate, simethicone, acetaminophen, famotidine, and polyethylene glycol.  Allergies as of 06/27/2021       Reactions   Nitrofurantoin Nausea And Vomiting   Penicillins Itching, Rash   Has patient had a PCN reaction causing immediate rash, facial/tongue/throat swelling, SOB or lightheadedness with hypotension: Yes Has patient had a PCN reaction causing severe rash involving mucus membranes or skin necrosis: Yes Has patient had a PCN reaction that required hospitalization: No Has patient had a PCN reaction occurring within the last 10 years: Unk If all of the above answers are "NO", then may proceed with Cephalosporin use.   Aspirin Nausea And Vomiting, Rash, Other (See Comments)   Stomach pain, also   Chlordiazepoxide Other (See Comments)   Flushing   Chlordiazepoxide Hcl Nausea And Vomiting, Nausea Only   Other reaction(s): GI Upset (intolerance), Other (See Comments), Unknown flushing   Demerol [meperidine Hcl] Other (See Comments)   Burning sensation all over   Meperidine Itching, Other (See Comments)   Burning sensation all over/Body was burning up   Povidone Iodine Rash   Bumps, burning also   Povidone-iodine Itching, Rash, Other (See Comments)   Burning and itching and bumps, burning    Lactose Nausea And Vomiting, Other (See Comments)   GI upset   Meperidine Hcl Other (See Comments)   Burning and tingling   Milk-related Compounds Nausea And Vomiting   Nexium [esomeprazole] Nausea And Vomiting   Omeprazole Nausea And Vomiting   Amoxicillin Itching, Rash   Codeine Nausea And Vomiting   Iodinated Diagnostic Agents Itching   Lactalbumin Nausea And Vomiting   Whey Nausea And Vomiting        Medication List        Accurate as of June 27, 2021 11:59 PM. If you have any questions, ask your nurse or doctor.          STOP taking these medications    dexlansoprazole 60 MG capsule Commonly known as: Dexilant Stopped by: Claretta Fraise, MD   diclofenac Sodium 1 % Gel Commonly known as: VOLTAREN Stopped by: Cletus Gash  Ashanty Coltrane, MD   metroNIDAZOLE 500 MG tablet Commonly known as: FLAGYL Stopped by: Claretta Fraise, MD   ondansetron 8 MG disintegrating tablet Commonly known as: Zofran ODT Stopped by: Claretta Fraise, MD   vancomycin 125 MG capsule Commonly known as: Vancocin HCl Stopped by: Claretta Fraise, MD   VITAMIN D-VITAMIN K PO Stopped by: Claretta Fraise, MD       TAKE these medications    acetaminophen 500 MG tablet Commonly known as: TYLENOL Take 1,000 mg by mouth every 8 (eight) hours as needed (for headaches).   amitriptyline 150 MG tablet Commonly known as: ELAVIL Take 150 mg by mouth at bedtime.   bismuth subsalicylate 937 MG chewable tablet Commonly known as: PEPTO BISMOL Chew 524 mg by mouth 3 (three) times daily as needed (for stomach pain).   famotidine 20 MG tablet Commonly known as: PEPCID Take 40 mg by mouth at bedtime.   LORazepam 0.5 MG tablet Commonly known as: ATIVAN Take 0.5 mg by mouth See admin instructions. Take 0.5 mg by mouth at bedtime and 0.5 mg one to two times a day as needed for anxiety   polyethylene glycol 17 g packet Commonly known as: MiraLax Take 17 g by mouth daily.   predniSONE 10 MG tablet Commonly  known as: DELTASONE Take 5 daily for 3 days followed by 4,3,2 and 1 for 3 days each. Started by: Claretta Fraise, MD   simethicone 125 MG chewable tablet Commonly known as: MYLICON Chew 902 mg by mouth every 6 (six) hours as needed for flatulence.         Follow-up: Return in about 6 weeks (around 08/08/2021).  Claretta Fraise, M.D.

## 2021-06-28 LAB — CMP14+EGFR
ALT: 15 IU/L (ref 0–32)
AST: 18 IU/L (ref 0–40)
Albumin/Globulin Ratio: 1.5 (ref 1.2–2.2)
Albumin: 4.4 g/dL (ref 3.7–4.7)
Alkaline Phosphatase: 82 IU/L (ref 44–121)
BUN/Creatinine Ratio: 20 (ref 12–28)
BUN: 17 mg/dL (ref 8–27)
Bilirubin Total: <0.2 mg/dL (ref 0.0–1.2)
CO2: 25 mmol/L (ref 20–29)
Calcium: 9.6 mg/dL (ref 8.7–10.3)
Chloride: 102 mmol/L (ref 96–106)
Creatinine, Ser: 0.87 mg/dL (ref 0.57–1.00)
Globulin, Total: 2.9 g/dL (ref 1.5–4.5)
Glucose: 107 mg/dL — ABNORMAL HIGH (ref 70–99)
Potassium: 4.5 mmol/L (ref 3.5–5.2)
Sodium: 142 mmol/L (ref 134–144)
Total Protein: 7.3 g/dL (ref 6.0–8.5)
eGFR: 70 mL/min/{1.73_m2} (ref >59)

## 2021-06-28 LAB — CBC WITH DIFFERENTIAL/PLATELET
Basophils Absolute: 0 10*3/uL (ref 0.0–0.2)
Basos: 1 %
EOS (ABSOLUTE): 0.1 10*3/uL (ref 0.0–0.4)
Eos: 1 %
Hematocrit: 36.2 % (ref 34.0–46.6)
Hemoglobin: 12.6 g/dL (ref 11.1–15.9)
Immature Grans (Abs): 0 10*3/uL (ref 0.0–0.1)
Immature Granulocytes: 0 %
Lymphocytes Absolute: 2.6 10*3/uL (ref 0.7–3.1)
Lymphs: 37 %
MCH: 31.2 pg (ref 26.6–33.0)
MCHC: 34.8 g/dL (ref 31.5–35.7)
MCV: 90 fL (ref 79–97)
Monocytes Absolute: 0.7 10*3/uL (ref 0.1–0.9)
Monocytes: 9 %
Neutrophils Absolute: 3.7 10*3/uL (ref 1.4–7.0)
Neutrophils: 52 %
Platelets: 326 10*3/uL (ref 150–450)
RBC: 4.04 x10E6/uL (ref 3.77–5.28)
RDW: 12.4 % (ref 11.7–15.4)
WBC: 7.2 10*3/uL (ref 3.4–10.8)

## 2021-06-28 LAB — SEDIMENTATION RATE: Sed Rate: 19 mm/hr (ref 0–40)

## 2021-06-28 LAB — RHEUMATOID ARTHRITIS PROFILE
Cyclic Citrullin Peptide Ab: 5 units (ref 0–19)
Rhuematoid fact SerPl-aCnc: <10 IU/mL (ref <14.0)

## 2021-06-29 ENCOUNTER — Telehealth: Payer: Self-pay | Admitting: Family Medicine

## 2021-06-30 NOTE — Progress Notes (Signed)
Hello Moriah,  Your lab result is normal and/or stable.Some minor variations that are not significant are commonly marked abnormal, but do not represent any medical problem for you.  Best regards, Mechele Claude, M.D.

## 2021-07-03 NOTE — Telephone Encounter (Signed)
Called patient, no answer, left message to return call 

## 2021-07-03 NOTE — Telephone Encounter (Signed)
LEt pt. Know her results are all normal. She needs to see Rheumatology

## 2021-07-05 NOTE — Telephone Encounter (Signed)
Patient aware.

## 2021-07-10 ENCOUNTER — Other Ambulatory Visit: Payer: Self-pay | Admitting: Family Medicine

## 2021-07-11 NOTE — Telephone Encounter (Signed)
We haven't filled this rx-from previous provider  Ok to fill?

## 2021-08-09 ENCOUNTER — Ambulatory Visit: Payer: Medicare Other | Admitting: Family Medicine

## 2021-08-29 ENCOUNTER — Ambulatory Visit: Payer: Medicare Other | Admitting: Family Medicine

## 2021-08-30 ENCOUNTER — Ambulatory Visit (INDEPENDENT_AMBULATORY_CARE_PROVIDER_SITE_OTHER): Payer: Medicare Other | Admitting: Family Medicine

## 2021-08-30 ENCOUNTER — Encounter: Payer: Self-pay | Admitting: Family Medicine

## 2021-08-30 VITALS — BP 89/55 | HR 80 | Temp 98.0°F | Ht 63.0 in | Wt 146.4 lb

## 2021-08-30 DIAGNOSIS — Z79899 Other long term (current) drug therapy: Secondary | ICD-10-CM | POA: Diagnosis not present

## 2021-08-30 DIAGNOSIS — Z5181 Encounter for therapeutic drug level monitoring: Secondary | ICD-10-CM | POA: Diagnosis not present

## 2021-08-30 NOTE — Progress Notes (Signed)
Subjective:  Patient ID: Shelley [name], female    DOB: 12/16/1945  Age: 76 y.o. MRN: [id number]  CC: No chief complaint on file.   HPI Shelley [name] presents for amitriptyline therapy surveillance. The med is followed by her psychiatrist who has requested an EKG. Pt. Denies palpitations, but the pt. Is being evaluated for possible ventricular arrhythmia.   Depression screen [facility] 2/9 08/30/2021 08/30/2021  Decreased Interest 0 0  Down, Depressed, Hopeless 0 0  PHQ - 2 Score 0 0  Altered sleeping 1 -  Tired, decreased energy 1 -  Change in appetite 1 -  Feeling bad or failure about yourself  0 -  Trouble concentrating 0 -  Moving slowly or fidgety/restless 0 -  Suicidal thoughts 0 -  PHQ-9 Score 3 -  Difficult doing work/chores Not difficult at all -    History Shelley [name] has a past medical history of Anxiety, Arthritis, Chronic fatigue syndrome, Complication of anesthesia, Depression, Diverticulosis, GERD (gastroesophageal reflux disease), History of hiatal hernia, IBS (irritable bowel syndrome), Internal hemorrhoids, and Seizures ([place]).   She has a past surgical history that includes Abdominal hysterectomy; Bladder suspension; Cataract extraction, bilateral (Bilateral); Bunionectomy; Therapeutic abortion; Nasal reconstruction; Facelift w/blepharoplasty; Esophagogastroduodenoscopy (egd) with propofol (N/A, 01/05/2015); and Colonoscopy with propofol (N/A, 01/05/2015).   Her family history includes Alcohol abuse in her brother and sister; Diabetes type II in her brother; Emphysema in her mother; Heart failure in her father; Hypertension in her father.She reports that she has never smoked. She has never used smokeless tobacco. She reports that she does not drink alcohol and does not use drugs.    ROS Review of Systems  Respiratory:  Negative for shortness of breath.   Cardiovascular:  Negative for chest pain and palpitations.   Objective:  BP (!) 89/55    Pulse 80    Temp 98 F  (36.7 C)    Ht 5\' 3"  (1.6 m)    Wt 146 lb 6.4 oz (66.4 kg)    SpO2 92%    BMI 25.93 kg/m   BP Readings from Last 3 Encounters:  08/30/21 (!) 89/55  06/27/21 113/62  12/15/20 97/68    Wt Readings from Last 3 Encounters:  08/30/21 146 lb 6.4 oz (66.4 kg)  06/27/21 148 lb 3.2 oz (67.2 kg)  06/13/18 131 lb 9.8 oz (59.7 kg)     Physical Exam Cardiovascular:     Rate and Rhythm: Normal rate and regular rhythm.  Pulmonary:     Breath sounds: Normal breath sounds.      Assessment & Plan:   Diagnoses and all orders for this visit:  Encounter for monitoring amitriptyline therapy -     EKG 12-Lead       I am having Shelley [name] maintain her amitriptyline, LORazepam, bismuth subsalicylate, simethicone, acetaminophen, polyethylene glycol, predniSONE, and famotidine.  Allergies as of 08/30/2021       Reactions   Nitrofurantoin Nausea And Vomiting   Penicillins Itching, Rash   Has patient had a PCN reaction causing immediate rash, facial/tongue/throat swelling, SOB or lightheadedness with hypotension: Yes Has patient had a PCN reaction causing severe rash involving mucus membranes or skin necrosis: Yes Has patient had a PCN reaction that required hospitalization: No Has patient had a PCN reaction occurring within the last 10 years: Unk If all of the above answers are "NO", then may proceed with Cephalosporin use.   Aspirin Nausea And Vomiting, Rash, Other (See Comments)   Stomach pain, also  Chlordiazepoxide Other (See Comments)   Flushing   Chlordiazepoxide Hcl Nausea And Vomiting, Nausea Only   Other reaction(s): GI Upset (intolerance), Other (See Comments), Unknown flushing   Demerol [meperidine Hcl] Other (See Comments)   Burning sensation all over   Meperidine Itching, Other (See Comments)   Burning sensation all over/Body was burning up   Povidone Iodine Rash   Bumps, burning also   Povidone-iodine Itching, Rash, Other (See Comments)   Burning and itching  and bumps, burning   Vancomycin Hives   Lactose Nausea And Vomiting, Other (See Comments)   GI upset   Meperidine Hcl Other (See Comments)   Burning and tingling   Milk-related Compounds Nausea And Vomiting   Nexium [esomeprazole] Nausea And Vomiting   Omeprazole Nausea And Vomiting   Amoxicillin Itching, Rash   Codeine Nausea And Vomiting   Iodinated Contrast Media Itching   Lactalbumin Nausea And Vomiting   Whey Nausea And Vomiting        Medication List        Accurate as of August 30, 2021 11:59 PM. If you have any questions, ask your nurse or doctor.          acetaminophen 500 MG tablet Commonly known as: TYLENOL Take 1,000 mg by mouth every 8 (eight) hours as needed (for headaches).   amitriptyline 150 MG tablet Commonly known as: ELAVIL Take 150 mg by mouth at bedtime.   bismuth subsalicylate 99991111 MG chewable tablet Commonly known as: PEPTO BISMOL Chew 524 mg by mouth 3 (three) times daily as needed (for stomach pain).   famotidine 20 MG tablet Commonly known as: PEPCID TAKE 1 TABLET BY MOUTH 2 TIMES DAILY AS NEEDED FOR HEARTBURN.   LORazepam 0.5 MG tablet Commonly known as: ATIVAN Take 0.5 mg by mouth See admin instructions. Take 0.5 mg by mouth at bedtime and 0.5 mg one to two times a day as needed for anxiety   polyethylene glycol 17 g packet Commonly known as: MiraLax Take 17 g by mouth daily.   predniSONE 10 MG tablet Commonly known as: DELTASONE Take 5 daily for 3 days followed by 4,3,2 and 1 for 3 days each.   simethicone 125 MG chewable tablet Commonly known as: MYLICON Chew 0000000 mg by mouth every 6 (six) hours as needed for flatulence.         Follow-up: No follow-ups on file.  Claretta Fraise, M.D.

## 2021-09-02 ENCOUNTER — Encounter: Payer: Self-pay | Admitting: Family Medicine

## 2021-10-05 ENCOUNTER — Other Ambulatory Visit: Payer: Self-pay | Admitting: Family Medicine

## 2021-10-10 ENCOUNTER — Encounter: Payer: Self-pay | Admitting: Family Medicine

## 2021-10-10 ENCOUNTER — Ambulatory Visit (INDEPENDENT_AMBULATORY_CARE_PROVIDER_SITE_OTHER): Payer: Medicare Other | Admitting: Family Medicine

## 2021-10-10 VITALS — BP 106/66 | HR 80 | Temp 97.6°F | Ht 63.0 in | Wt 148.8 lb

## 2021-10-10 DIAGNOSIS — M79672 Pain in left foot: Secondary | ICD-10-CM

## 2021-10-10 DIAGNOSIS — M79671 Pain in right foot: Secondary | ICD-10-CM

## 2021-10-10 DIAGNOSIS — F418 Other specified anxiety disorders: Secondary | ICD-10-CM

## 2021-10-10 MED ORDER — PHENTERMINE HCL 15 MG PO CAPS: 15.0000 mg | ORAL_CAPSULE | ORAL | 2 refills | Status: DC

## 2021-10-10 NOTE — Progress Notes (Signed)
Subjective:  Patient ID: Shelley [name], female    DOB: May 16, 1946  Age: 76 y.o. MRN: [id number]  CC: Medical Management of Chronic Issues   HPI Shelley [name] presents for desire for weight loss. Concerned for covid potential.   Taking collagen, vitamin D and fish oil daily. Also taking a supplement for eye health. Wonders if they might be causing weight gain.  Sometimes her mind goes blank. Considered prevagen, but scared of it.   Depression screen [facility] 2/9 10/10/2021 10/10/2021 08/30/2021  Decreased Interest 1 0 0  Down, Depressed, Hopeless 1 0 0  PHQ - 2 Score 2 0 0  Altered sleeping 1 - 1  Tired, decreased energy 1 - 1  Change in appetite 1 - 1  Feeling bad or failure about yourself  0 - 0  Trouble concentrating 0 - 0  Moving slowly or fidgety/restless 0 - 0  Suicidal thoughts 0 - 0  PHQ-9 Score 5 - 3  Difficult doing work/chores Somewhat difficult - Not difficult at all    History Shelley [name] has a past medical history of Anxiety, Arthritis, Chronic fatigue syndrome, Complication of anesthesia, Depression, Diverticulosis, GERD (gastroesophageal reflux disease), History of hiatal hernia, IBS (irritable bowel syndrome), Internal hemorrhoids, and Seizures ([place]).   She has a past surgical history that includes Abdominal hysterectomy; Bladder suspension; Cataract extraction, bilateral (Bilateral); Bunionectomy; Therapeutic abortion; Nasal reconstruction; Facelift w/blepharoplasty; Esophagogastroduodenoscopy (egd) with propofol (N/A, 01/05/2015); and Colonoscopy with propofol (N/A, 01/05/2015).   Her family history includes Alcohol abuse in her brother and sister; Diabetes type II in her brother; Emphysema in her mother; Heart failure in her father; Hypertension in her father.She reports that she has never smoked. She has never used smokeless tobacco. She reports that she does not drink alcohol and does not use drugs.    ROS Review of Systems  Constitutional: Negative.   HENT:  Negative.    Eyes:  Negative for visual disturbance.  Respiratory:  Negative for shortness of breath.   Cardiovascular:  Negative for chest pain.  Gastrointestinal:  Negative for abdominal pain.  Musculoskeletal:  Negative for arthralgias.  Psychiatric/Behavioral:  Positive for decreased concentration (Forgetful). The patient is nervous/anxious.    Objective:  BP 106/66    Pulse 80    Temp 97.6 F (36.4 C)    Ht 5\' 3"  (1.6 m)    Wt 148 lb 12.8 oz (67.5 kg)    SpO2 94%    BMI 26.36 kg/m   BP Readings from Last 3 Encounters:  10/10/21 106/66  08/30/21 (!) 89/55  06/27/21 113/62    Wt Readings from Last 3 Encounters:  10/10/21 148 lb 12.8 oz (67.5 kg)  08/30/21 146 lb 6.4 oz (66.4 kg)  06/27/21 148 lb 3.2 oz (67.2 kg)     Physical Exam Constitutional:      General: She is not in acute distress.    Appearance: She is well-developed.  Cardiovascular:     Rate and Rhythm: Normal rate and regular rhythm.  Pulmonary:     Breath sounds: Normal breath sounds.  Musculoskeletal:        General: Normal range of motion.  Skin:    General: Skin is warm and dry.  Neurological:     Mental Status: She is alert and oriented to person, place, and time.      Assessment & Plan:   Shelley [name] was seen today for medical management of chronic issues.  Diagnoses and all orders for this visit:  Foot pain,  bilateral  Depression with anxiety  Other orders -     phentermine 15 MG capsule; Take 1 capsule (15 mg total) by mouth every morning.       I have discontinued Shelley [name]'s predniSONE. I am also having her start on phentermine. Additionally, I am having her maintain her amitriptyline, LORazepam, bismuth subsalicylate, simethicone, acetaminophen, polyethylene glycol, and famotidine.  Allergies as of 10/10/2021       Reactions   Nitrofurantoin Nausea And Vomiting   Penicillins Itching, Rash   Has patient had a PCN reaction causing immediate rash, facial/tongue/throat  swelling, SOB or lightheadedness with hypotension: Yes Has patient had a PCN reaction causing severe rash involving mucus membranes or skin necrosis: Yes Has patient had a PCN reaction that required hospitalization: No Has patient had a PCN reaction occurring within the last 10 years: Unk If all of the above answers are "NO", then may proceed with Cephalosporin use.   Aspirin Nausea And Vomiting, Rash, Other (See Comments)   Stomach pain, also   Chlordiazepoxide Other (See Comments)   Flushing   Chlordiazepoxide Hcl Nausea And Vomiting, Nausea Only   Other reaction(s): GI Upset (intolerance), Other (See Comments), Unknown flushing   Demerol [meperidine Hcl] Other (See Comments)   Burning sensation all over   Meperidine Itching, Other (See Comments)   Burning sensation all over/Body was burning up   Povidone Iodine Rash   Bumps, burning also   Povidone-iodine Itching, Rash, Other (See Comments)   Burning and itching and bumps, burning   Vancomycin Hives   Lactose Nausea And Vomiting, Other (See Comments)   GI upset   Meperidine Hcl Other (See Comments)   Burning and tingling   Milk-related Compounds Nausea And Vomiting   Nexium [esomeprazole] Nausea And Vomiting   Omeprazole Nausea And Vomiting   Amoxicillin Itching, Rash   Codeine Nausea And Vomiting   Iodinated Contrast Media Itching   Lactalbumin Nausea And Vomiting   Whey Nausea And Vomiting        Medication List        Accurate as of October 10, 2021  3:09 PM. If you have any questions, ask your nurse or doctor.          STOP taking these medications    predniSONE 10 MG tablet Commonly known as: DELTASONE Stopped by: Claretta Fraise, MD       TAKE these medications    acetaminophen 500 MG tablet Commonly known as: TYLENOL Take 1,000 mg by mouth every 8 (eight) hours as needed (for headaches).   amitriptyline 150 MG tablet Commonly known as: ELAVIL Take 150 mg by mouth at bedtime.   bismuth  subsalicylate 99991111 MG chewable tablet Commonly known as: PEPTO BISMOL Chew 524 mg by mouth 3 (three) times daily as needed (for stomach pain).   famotidine 20 MG tablet Commonly known as: PEPCID TAKE 1 TABLET BY MOUTH 2 TIMES DAILY AS NEEDED FOR HEARTBURN.   LORazepam 0.5 MG tablet Commonly known as: ATIVAN Take 0.5 mg by mouth See admin instructions. Take 0.5 mg by mouth at bedtime and 0.5 mg one to two times a day as needed for anxiety   phentermine 15 MG capsule Take 1 capsule (15 mg total) by mouth every morning. Started by: Claretta Fraise, MD   polyethylene glycol 17 g packet Commonly known as: MiraLax Take 17 g by mouth daily.   simethicone 125 MG chewable tablet Commonly known as: MYLICON Chew 0000000 mg by mouth every 6 (six) hours  as needed for flatulence.         Follow-up: No follow-ups on file.  Claretta Fraise, M.D.

## 2022-02-06 ENCOUNTER — Other Ambulatory Visit: Payer: Self-pay | Admitting: Family Medicine

## 2022-04-17 ENCOUNTER — Other Ambulatory Visit: Payer: Self-pay | Admitting: Family Medicine

## 2022-04-17 NOTE — Telephone Encounter (Signed)
Stacks. NTBS 30 days given 02/06/22

## 2022-04-19 ENCOUNTER — Encounter: Payer: Self-pay | Admitting: Family Medicine

## 2022-04-19 NOTE — Telephone Encounter (Signed)
LMOVM to call and make appt. Letter sent.

## 2022-04-24 ENCOUNTER — Telehealth: Payer: Self-pay | Admitting: Family Medicine

## 2022-04-24 MED ORDER — FAMOTIDINE 20 MG PO TABS: 20.0000 mg | ORAL_TABLET | Freq: Two times a day (BID) | ORAL | 0 refills | Status: DC

## 2022-04-24 NOTE — Telephone Encounter (Signed)
One month supply sent to pts pharmacy.

## 2022-04-24 NOTE — Telephone Encounter (Signed)
  Prescription Request  04/24/2022  Is this a "Controlled Substance" medicine? no  Have you seen your PCP in the last 2 weeks? Appt made for 9/20   If YES, route message to pool  -  If NO, patient needs to be scheduled for appointment.  What is the name of the medication or equipment? famotidine (PEPCID) 20 MG tablet  Have you contacted your pharmacy to request a refill? Yes    Which pharmacy would you like this sent to? [id number] - [place], [place] - [address] (Ph: [phone])   Patient notified that their request is being sent to the clinical staff for review and that they should receive a response within 2 business days.

## 2022-05-09 ENCOUNTER — Ambulatory Visit: Payer: Medicare Other | Admitting: Family Medicine

## 2022-05-21 ENCOUNTER — Ambulatory Visit: Payer: Medicare Other | Admitting: Family Medicine

## 2022-05-23 ENCOUNTER — Other Ambulatory Visit: Payer: Self-pay | Admitting: Family Medicine

## 2022-06-19 ENCOUNTER — Ambulatory Visit (INDEPENDENT_AMBULATORY_CARE_PROVIDER_SITE_OTHER): Payer: Medicare Other | Admitting: Family Medicine

## 2022-06-19 ENCOUNTER — Encounter: Payer: Self-pay | Admitting: Family Medicine

## 2022-06-19 VITALS — BP 99/62 | HR 72 | Temp 97.5°F | Ht 63.0 in | Wt 147.4 lb

## 2022-06-19 DIAGNOSIS — K2100 Gastro-esophageal reflux disease with esophagitis, without bleeding: Secondary | ICD-10-CM | POA: Diagnosis not present

## 2022-06-19 MED ORDER — FAMOTIDINE 20 MG PO TABS: 20.0000 mg | ORAL_TABLET | Freq: Two times a day (BID) | ORAL | 3 refills | Status: DC

## 2022-06-19 MED ORDER — PANTOPRAZOLE SODIUM 40 MG PO TBEC: 40.0000 mg | DELAYED_RELEASE_TABLET | Freq: Every day | ORAL | 11 refills | Status: DC

## 2022-06-19 NOTE — Progress Notes (Signed)
Subjective:  Patient ID: Shelley [name], female    DOB: [birth year]  Age: 76 y.o. MRN: [id number]  CC: Medication Refill   HPI Shelley [name] presents for ongoing reflux and heartburn. Many foods causing heartburn including tomatoes, peppers, fruit juices and others. Pepcid not controlling sx at current dose. Anxiety treatment done with psychiatry.      06/19/2022    3:45 PM 10/10/2021    2:32 PM 10/10/2021    1:49 PM  Depression screen PHQ 2/9  Decreased Interest 0 1 0  Down, Depressed, Hopeless 0 1 0  PHQ - 2 Score 0 2 0  Altered sleeping  1   Tired, decreased energy  1   Change in appetite  1   Feeling bad or failure about yourself   0   Trouble concentrating  0   Moving slowly or fidgety/restless  0   Suicidal thoughts  0   PHQ-9 Score  5   Difficult doing work/chores  Somewhat difficult     History Chanae has a past medical history of Anxiety, Arthritis, Chronic fatigue syndrome, Complication of anesthesia, Depression, Diverticulosis, GERD (gastroesophageal reflux disease), History of hiatal hernia, IBS (irritable bowel syndrome), Internal hemorrhoids, and Seizures ([facility]).   She has a past surgical history that includes Abdominal hysterectomy; Bladder suspension; Cataract extraction, bilateral (Bilateral); Bunionectomy; Therapeutic abortion; Nasal reconstruction; Facelift w/blepharoplasty; Esophagogastroduodenoscopy (egd) with propofol (N/A, 01/05/2015); and Colonoscopy with propofol (N/A, 01/05/2015).   Her family history includes Alcohol abuse in her brother and sister; Diabetes type II in her brother; Emphysema in her mother; Heart failure in her father; Hypertension in her father.She reports that she has never smoked. She has never used smokeless tobacco. She reports that she does not drink alcohol and does not use drugs.    ROS Review of Systems  Constitutional: Negative.   HENT: Negative.    Eyes:  Negative for visual disturbance.  Respiratory:  Negative for  shortness of breath.   Cardiovascular:  Negative for chest pain.  Gastrointestinal:  Negative for abdominal pain.  Musculoskeletal:  Negative for arthralgias.    Objective:  BP 99/62   Pulse 72   Temp (!) 97.5 F (36.4 C)   Ht 5\' 3"  (1.6 m)   Wt 147 lb 6.4 oz (66.9 kg)   SpO2 95%   BMI 26.11 kg/m   BP Readings from Last 3 Encounters:  06/19/22 99/62  10/10/21 106/66  08/30/21 (!) 89/55    Wt Readings from Last 3 Encounters:  06/19/22 147 lb 6.4 oz (66.9 kg)  10/10/21 148 lb 12.8 oz (67.5 kg)  08/30/21 146 lb 6.4 oz (66.4 kg)     Physical Exam Constitutional:      General: She is not in acute distress.    Appearance: She is well-developed.  Cardiovascular:     Rate and Rhythm: Normal rate and regular rhythm.  Pulmonary:     Breath sounds: Normal breath sounds.  Musculoskeletal:        General: Normal range of motion.  Skin:    General: Skin is warm and dry.  Neurological:     Mental Status: She is alert and oriented to person, place, and time.       Assessment & Plan:   Shaely was seen today for medication refill.  Diagnoses and all orders for this visit:  Gastroesophageal reflux disease with esophagitis without hemorrhage  Other orders -     famotidine (PEPCID) 20 MG tablet; Take 1 tablet (20 mg total)  by mouth 2 (two) times daily. -     pantoprazole (PROTONIX) 40 MG tablet; Take 1 tablet (40 mg total) by mouth daily. For stomach       I have discontinued Jordanne Elsbury. Arcos's polyethylene glycol and phentermine. I am also having her start on pantoprazole. Additionally, I am having her maintain her amitriptyline, LORazepam, bismuth subsalicylate, simethicone, acetaminophen, and famotidine.  Allergies as of 06/19/2022       Reactions   Nitrofurantoin Nausea And Vomiting   Penicillins Itching, Rash   Has patient had a PCN reaction causing immediate rash, facial/tongue/throat swelling, SOB or lightheadedness with hypotension: Yes Has patient had a  PCN reaction causing severe rash involving mucus membranes or skin necrosis: Yes Has patient had a PCN reaction that required hospitalization: No Has patient had a PCN reaction occurring within the last 10 years: Unk If all of the above answers are "NO", then may proceed with Cephalosporin use.   Aspirin Nausea And Vomiting, Rash, Other (See Comments)   Stomach pain, also   Chlordiazepoxide Other (See Comments)   Flushing   Chlordiazepoxide Hcl Nausea And Vomiting, Nausea Only   Other reaction(s): GI Upset (intolerance), Other (See Comments), Unknown flushing   Demerol [meperidine Hcl] Other (See Comments)   Burning sensation all over   Meperidine Itching, Other (See Comments)   Burning sensation all over/Body was burning up   Povidone Iodine Rash   Bumps, burning also   Povidone-iodine Itching, Rash, Other (See Comments)   Burning and itching and bumps, burning   Vancomycin Hives   Lactose Nausea And Vomiting, Other (See Comments)   GI upset   Meperidine Hcl Other (See Comments)   Burning and tingling   Milk-related Compounds Nausea And Vomiting   Nexium [esomeprazole] Nausea And Vomiting   Omeprazole Nausea And Vomiting   Amoxicillin Itching, Rash   Codeine Nausea And Vomiting   Iodinated Contrast Media Itching   Lactalbumin Nausea And Vomiting   Whey Nausea And Vomiting        Medication List        Accurate as of June 19, 2022  5:22 PM. If you have any questions, ask your nurse or doctor.          STOP taking these medications    phentermine 15 MG capsule Stopped by: Mechele Claude, MD   polyethylene glycol 17 g packet Commonly known as: MiraLax Stopped by: Mechele Claude, MD       TAKE these medications    acetaminophen 500 MG tablet Commonly known as: TYLENOL Take 1,000 mg by mouth every 8 (eight) hours as needed (for headaches).   amitriptyline 150 MG tablet Commonly known as: ELAVIL Take 150 mg by mouth at bedtime.   bismuth subsalicylate  262 MG chewable tablet Commonly known as: PEPTO BISMOL Chew 524 mg by mouth 3 (three) times daily as needed (for stomach pain).   famotidine 20 MG tablet Commonly known as: PEPCID Take 1 tablet (20 mg total) by mouth 2 (two) times daily.   LORazepam 0.5 MG tablet Commonly known as: ATIVAN Take 0.5 mg by mouth See admin instructions. Take 0.5 mg by mouth at bedtime and 0.5 mg one to two times a day as needed for anxiety   pantoprazole 40 MG tablet Commonly known as: PROTONIX Take 1 tablet (40 mg total) by mouth daily. For stomach Started by: Mechele Claude, MD   simethicone 125 MG chewable tablet Commonly known as: MYLICON Chew 125 mg by mouth every 6 (six)  hours as needed for flatulence.         Follow-up: Return in about 3 months (around 09/19/2022) for [facility] physical.  Claretta Fraise, M.D.

## 2022-07-04 ENCOUNTER — Other Ambulatory Visit: Payer: Self-pay

## 2022-07-04 ENCOUNTER — Emergency Department (HOSPITAL_BASED_OUTPATIENT_CLINIC_OR_DEPARTMENT_OTHER): Payer: Medicare Other | Admitting: Radiology

## 2022-07-04 ENCOUNTER — Emergency Department (HOSPITAL_BASED_OUTPATIENT_CLINIC_OR_DEPARTMENT_OTHER)
Admission: EM | Admit: 2022-07-04 | Discharge: 2022-07-04 | Disposition: A | Discharge status: Home / Self Care | Payer: Medicare Other | Attending: Emergency Medicine | Admitting: Emergency Medicine

## 2022-07-04 ENCOUNTER — Encounter (HOSPITAL_BASED_OUTPATIENT_CLINIC_OR_DEPARTMENT_OTHER): Payer: Self-pay | Admitting: Emergency Medicine

## 2022-07-04 DIAGNOSIS — T18108A Unspecified foreign body in esophagus causing other injury, initial encounter: Secondary | ICD-10-CM | POA: Diagnosis not present

## 2022-07-04 DIAGNOSIS — X58XXXA Exposure to other specified factors, initial encounter: Secondary | ICD-10-CM | POA: Insufficient documentation

## 2022-07-04 MED ORDER — SODIUM CHLORIDE 0.9 % IV BOLUS: 1000.0000 mL | Freq: Once | INTRAVENOUS | Status: DC

## 2022-07-04 NOTE — Discharge Instructions (Signed)
Continue to consume water as this may help dissolve the pill.  Follow-up with primary doctor tomorrow as scheduled.

## 2022-07-04 NOTE — ED Triage Notes (Signed)
Patient states she feels like her night time medications are stuck in her throat. Tried to force vomit but feels it is still stuck. Was able to drink water. Controlling own secretions. No difficulty breathing

## 2022-07-04 NOTE — ED Provider Notes (Signed)
[facility] EMERGENCY DEPT Provider Note   CSN: [id number] Arrival date & time: 07/04/22  0135     History  No chief complaint on file.   Shelley [name] is a 76 y.o. female.  Patient is a 76 year old female with past medical history of depression, anxiety, irritable bowel.  Patient presenting today with complaints of sensation of esophageal foreign body.  Patient states that she took her amitriptyline tonight before going to bed.  She feels like it got stuck in her esophagus and will not go down.  She describes a burning to her throat.  She has tried drinking water, however this has not helped.  The history is provided by the patient.       Home Medications Prior to Admission medications   Medication Sig Start Date End Date Taking? Authorizing Provider  acetaminophen (TYLENOL) 500 MG tablet Take 1,000 mg by mouth every 8 (eight) hours as needed (for headaches).     [provider]  amitriptyline (ELAVIL) 150 MG tablet Take 150 mg by mouth at bedtime.      [provider]  bismuth subsalicylate (PEPTO BISMOL) 262 MG chewable tablet Chew 524 mg by mouth 3 (three) times daily as needed (for stomach pain).     [provider]  famotidine (PEPCID) 20 MG tablet Take 1 tablet (20 mg total) by mouth 2 (two) times daily. 06/19/22   Mechele Claude, MD  LORazepam (ATIVAN) 0.5 MG tablet Take 0.5 mg by mouth See admin instructions. Take 0.5 mg by mouth at bedtime and 0.5 mg one to two times a day as needed for anxiety    [provider]  pantoprazole (PROTONIX) 40 MG tablet Take 1 tablet (40 mg total) by mouth daily. For stomach 06/19/22   Mechele Claude, MD  simethicone (MYLICON) 125 MG chewable tablet Chew 125 mg by mouth every 6 (six) hours as needed for flatulence.     [provider]      Allergies    Nitrofurantoin, Penicillins, Aspirin, Chlordiazepoxide, Chlordiazepoxide hcl, Demerol [meperidine hcl], Meperidine, Povidone  iodine, Povidone-iodine, Vancomycin, Lactose, Meperidine hcl, Milk-related compounds, Nexium [esomeprazole], Omeprazole, Protonix [pantoprazole sodium], Amoxicillin, Codeine, Iodinated contrast media, Lactalbumin, and Whey    Review of Systems   Review of Systems  All other systems reviewed and are negative.   Physical Exam Updated Vital Signs BP 108/82 (BP Location: Right Arm)   Pulse 87   Temp 98.1 F (36.7 C)   Resp 18   SpO2 96%  Physical Exam Vitals and nursing note reviewed.  Constitutional:      General: She is not in acute distress.    Appearance: She is well-developed. She is not diaphoretic.  HENT:     Head: Normocephalic and atraumatic.     Mouth/Throat:     Mouth: Mucous membranes are moist.     Pharynx: No oropharyngeal exudate or posterior oropharyngeal erythema.  Cardiovascular:     Rate and Rhythm: Normal rate and regular rhythm.     Heart sounds: No murmur heard.    No friction rub. No gallop.  Pulmonary:     Effort: Pulmonary effort is normal. No respiratory distress.     Breath sounds: Normal breath sounds. No wheezing.  Abdominal:     General: Bowel sounds are normal. There is no distension.     Palpations: Abdomen is soft.     Tenderness: There is no abdominal tenderness.  Musculoskeletal:        General: Normal range of motion.  Cervical back: Normal range of motion and neck supple.  Skin:    General: Skin is warm and dry.  Neurological:     General: No focal deficit present.     Mental Status: She is alert and oriented to person, place, and time.     ED Results / Procedures / Treatments   Labs (all labs ordered are listed, but only abnormal results are displayed) Labs Reviewed - No data to display  EKG None  Radiology No results found.  Procedures Procedures    Medications Ordered in ED Medications - No data to display  ED Course/ Medical Decision Making/ A&P  Patient presenting here with complaints of irritation to her  throat and foreign body sensation after taking amitriptyline prior to going to bed.  She is not coughing or gagging, and I see no respiratory issues.  Vital signs are stable with no hypoxia.  X-rays obtained showing no evidence for foreign body.  Patient has been observed for 2 hours and has had no untoward effects.  Patient advised to consume water as this should help to dissolve the pill if it remains stuck in her esophagus.  Patient is in the process of arranging GI follow-up for unrelated issues and I will have her follow-up with them if she continues to have problems.  Final Clinical Impression(s) / ED Diagnoses Final diagnoses:  None    Rx / DC Orders ED Discharge Orders     None         Geoffery Lyons, MD 07/04/22 [phone]

## 2022-07-09 ENCOUNTER — Telehealth: Payer: Self-pay | Admitting: Family Medicine

## 2022-07-09 NOTE — Telephone Encounter (Signed)
physical

## 2022-07-09 NOTE — Telephone Encounter (Signed)
She will have to be put on a cancellation list.

## 2022-07-09 NOTE — Telephone Encounter (Signed)
PATIENT DOES NOT WANT TO WAIT, WOULD LIKE TO KNOW WHAT TYPE OF SPECIALIST SHE WOULD NEED TO SEE FOR TESTING FOR MOLD EXPOSURE?

## 2022-07-09 NOTE — Telephone Encounter (Signed)
Allergist

## 2022-07-10 NOTE — Telephone Encounter (Signed)
Left message to call back  

## 2022-07-10 NOTE — Telephone Encounter (Signed)
Called patient, no answer, left message to return call 

## 2022-07-11 NOTE — Telephone Encounter (Signed)
Pt is aware that allergist is the specialist she should see and she says she doesn't think she needs a referral so she will call and try to get an appointment but let us know if she needs referral.

## 2022-07-11 NOTE — Telephone Encounter (Signed)
R/C to nurse 

## 2022-07-11 NOTE — Telephone Encounter (Signed)
Busy signal JBB 11/22

## 2022-07-26 ENCOUNTER — Ambulatory Visit: Payer: Medicare Other | Admitting: Allergy

## 2022-07-26 NOTE — Progress Notes (Deleted)
New Patient Note  RE: Shelley [name] MRN: [id number] DOB: [birth year] Date of Office Visit: 07/26/2022  Consult requested by: Mechele Claude, MD Primary care provider: Mechele Claude, MD  Chief Complaint: No chief complaint on file.  History of Present Illness: I had the pleasure of seeing Shali Vesey for initial evaluation at the [facility] on 07/26/2022. She is a 76 y.o. female, who is referred here by Mechele Claude, MD for the evaluation of mold allergy.  She reports symptoms of ***. Symptoms have been going on for *** years. The symptoms are present *** all year around with worsening in ***. Other triggers include exposure to ***. Anosmia: ***. Headache: ***. She has used *** with ***fair improvement in symptoms. Sinus infections: ***. Previous work up includes: ***. Previous ENT evaluation: ***. Previous sinus imaging: ***. History of nasal polyps: ***. Last eye exam: ***. History of reflux: ***.  Assessment and Plan: Zykerria is a 76 y.o. female with: No problem-specific Assessment & Plan notes found for this encounter.  No follow-ups on file.  No orders of the defined types were placed in this encounter.  Lab Orders  No laboratory test(s) ordered today    Other allergy screening: Asthma: {Blank single:19197::"yes","no"} Rhino conjunctivitis: {Blank single:19197::"yes","no"} Food allergy: {Blank single:19197::"yes","no"} Medication allergy: {Blank single:19197::"yes","no"} Hymenoptera allergy: {Blank single:19197::"yes","no"} Urticaria: {Blank single:19197::"yes","no"} Eczema:{Blank single:19197::"yes","no"} History of recurrent infections suggestive of immunodeficency: {Blank single:19197::"yes","no"}  Diagnostics: Spirometry:  Tracings reviewed. Her effort: {Blank single:19197::"Good reproducible efforts.","It was hard to get consistent efforts and there is a question as to whether this reflects a maximal maneuver.","Poor effort, data can  not be interpreted."} FVC: ***L FEV1: ***L, ***% predicted FEV1/FVC ratio: ***% Interpretation: {Blank single:19197::"Spirometry consistent with mild obstructive disease","Spirometry consistent with moderate obstructive disease","Spirometry consistent with severe obstructive disease","Spirometry consistent with possible restrictive disease","Spirometry consistent with mixed obstructive and restrictive disease","Spirometry uninterpretable due to technique","Spirometry consistent with normal pattern","No overt abnormalities noted given today's efforts"}.  Please see scanned spirometry results for details.  Skin Testing: {Blank single:19197::"Select foods","Environmental allergy panel","Environmental allergy panel and select foods","Food allergy panel","None","Deferred due to recent antihistamines use"}. *** Results discussed with patient/family.   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   BLADDER PROLAPSE 04/24/2007   SYMPTOM, ENLARGEMENT, LYMPH NODES 04/24/2007   GASTRITIS 01/03/2007   OSTEOARTHROSIS NOS, HAND 10/23/2006   DEPRESSION, MAJOR, RECURRENT 10/17/2006   ANXIETY 10/17/2006   HEMORRHOIDS, NOS 10/17/2006   GERD (gastroesophageal reflux disease) 10/17/2006   DIVERTICULITIS OF COLON, NOS 10/17/2006   IRRITABLE BOWEL SYNDROME 10/17/2006   Past Medical History:  Diagnosis Date   Anxiety    Arthritis    osteoarthritis- back, knees, hands   Chronic fatigue syndrome    Complication of anesthesia    had decreased temp   Depression    Diverticulosis     GERD (gastroesophageal reflux disease)    History of hiatal hernia    IBS (irritable bowel syndrome)    Internal hemorrhoids    Seizures (HCC)    migraines   Past Surgical History: Past Surgical History:  Procedure Laterality Date  ABDOMINAL HYSTERECTOMY     BLADDER SUSPENSION     Prolasped bladder/mesh: revision surgeon -[facility] "remains with retention and constipation"needing more surgery"   BUNIONECTOMY     bilateral   CATARACT EXTRACTION, BILATERAL Bilateral    COLONOSCOPY WITH PROPOFOL N/A 01/05/2015   Procedure: COLONOSCOPY WITH PROPOFOL;  Surgeon: Willis ModenaWilliam Outlaw, MD;  Location: [facility];  Service: Endoscopy;  Laterality: N/A;   ESOPHAGOGASTRODUODENOSCOPY (EGD) WITH PROPOFOL N/A 01/05/2015   Procedure: ESOPHAGOGASTRODUODENOSCOPY (EGD) WITH PROPOFOL;  Surgeon: Willis ModenaWilliam Outlaw, MD;  Location: [facility];  Service: Endoscopy;  Laterality: N/A;   FACELIFT W/BLEPHAROPLASTY     NASAL RECONSTRUCTION     THERAPEUTIC ABORTION     '93   Medication List:  Current Outpatient Medications  Medication Sig Dispense Refill   acetaminophen (TYLENOL) 500 MG tablet Take 1,000 mg by mouth every 8 (eight) hours as needed (for headaches).      amitriptyline (ELAVIL) 150 MG tablet Take 150 mg by mouth at bedtime.       bismuth subsalicylate (PEPTO BISMOL) 262 MG chewable tablet Chew 524 mg by mouth 3 (three) times daily as needed (for stomach pain).      famotidine (PEPCID) 20 MG tablet Take 1 tablet (20 mg total) by mouth 2 (two) times daily. 180 tablet 3   LORazepam (ATIVAN) 0.5 MG tablet Take 0.5 mg by mouth See admin instructions. Take 0.5 mg by mouth at bedtime and 0.5 mg one to two times a day as needed for anxiety     pantoprazole (PROTONIX) 40 MG tablet Take 1 tablet (40 mg total) by mouth daily. For stomach 30 tablet 11   simethicone (MYLICON) 125 MG chewable tablet Chew 125 mg by mouth every 6 (six) hours as needed for flatulence.      No current facility-administered medications for  this visit.   Allergies: Allergies  Allergen Reactions   Nitrofurantoin Nausea And Vomiting   Penicillins Itching and Rash    Has patient had a PCN reaction causing immediate rash, facial/tongue/throat swelling, SOB or lightheadedness with hypotension: Yes Has patient had a PCN reaction causing severe rash involving mucus membranes or skin necrosis: Yes Has patient had a PCN reaction that required hospitalization: No Has patient had a PCN reaction occurring within the last 10 years: Unk If all of the above answers are "NO", then may proceed with Cephalosporin use.    Aspirin Nausea And Vomiting, Rash and Other (See Comments)    Stomach pain, also    Chlordiazepoxide Other (See Comments)    Flushing    Chlordiazepoxide Hcl Nausea And Vomiting and Nausea Only    Other reaction(s): GI Upset (intolerance), Other (See Comments), Unknown flushing    Demerol [Meperidine Hcl] Other (See Comments)    Burning sensation all over   Meperidine Itching and Other (See Comments)    Burning sensation all over/Body was burning up    Povidone Iodine Rash    Bumps, burning also   Povidone-Iodine Itching, Rash and Other (See Comments)    Burning and itching and bumps, burning    Vancomycin Hives   Lactose Nausea And Vomiting and Other (See Comments)    GI upset    Meperidine Hcl Other (See Comments)    Burning and tingling   Milk-Related Compounds Nausea And Vomiting   Nexium [Esomeprazole] Nausea And Vomiting   Omeprazole Nausea And Vomiting   Protonix [Pantoprazole Sodium] Nausea And Vomiting   Amoxicillin Itching and Rash   Codeine Nausea And Vomiting   Iodinated Contrast  Media Itching   Lactalbumin Nausea And Vomiting   Whey Nausea And Vomiting   Social History: Social History   Socioeconomic History   Marital status: Divorced    Spouse name: Not on file   Number of children: 2   Years of education: Not on file   Highest education level: Not on file  Occupational History    Occupation: Disabled    Employer: RETIRED  Tobacco Use   Smoking status: Never   Smokeless tobacco: Never  Vaping Use   Vaping Use: Never used  Substance and Sexual Activity   Alcohol use: No   Drug use: No   Sexual activity: Not on file  Other Topics Concern   Not on file  Social History Narrative   Not on file   Social Determinants of Health   Financial Resource Strain: Not on file  Food Insecurity: Not on file  Transportation Needs: Not on file  Physical Activity: Not on file  Stress: Not on file  Social Connections: Not on file   Lives in a ***. Smoking: *** Occupation: ***  Environmental HistorySurveyor, minerals in the house: Copywriter, advertising in the family room: {Blank single:19197::"yes","no"} Carpet in the bedroom: {Blank single:19197::"yes","no"} Heating: {Blank single:19197::"electric","gas","heat pump"} Cooling: {Blank single:19197::"central","window","heat pump"} Pet: {Blank single:19197::"yes ***","no"}  Family History: Family History  Problem Relation Age of Onset   Emphysema Mother        Died at 3   Heart failure Father    Hypertension Father    Diabetes type II Brother    Alcohol abuse Sister        x2   Alcohol abuse Brother        x3   Problem                               Relation Asthma                                   *** Eczema                                *** Food allergy                          *** Allergic rhino conjunctivitis     ***  Review of Systems  Constitutional:  Negative for appetite change, chills, fever and unexpected weight change.  HENT:  Negative for congestion and rhinorrhea.   Eyes:  Negative for itching.  Respiratory:  Negative for cough, chest tightness, shortness of breath and wheezing.   Cardiovascular:  Negative for chest pain.  Gastrointestinal:  Negative for abdominal pain.  Genitourinary:  Negative for difficulty urinating.  Skin:  Negative for rash.  Neurological:   Negative for headaches.    Objective: There were no vitals taken for this visit. There is no height or weight on file to calculate BMI. Physical Exam Vitals and nursing note reviewed.  Constitutional:      Appearance: Normal appearance. She is well-developed.  HENT:     Head: Normocephalic and atraumatic.     Right Ear: Tympanic membrane and external ear normal.     Left Ear: Tympanic membrane and external ear normal.     Nose: Nose normal.     Mouth/Throat:  Mouth: Mucous membranes are moist.     Pharynx: Oropharynx is clear.  Eyes:     Conjunctiva/sclera: Conjunctivae normal.  Cardiovascular:     Rate and Rhythm: Normal rate and regular rhythm.     Heart sounds: Normal heart sounds. No murmur heard.    No friction rub. No gallop.  Pulmonary:     Effort: Pulmonary effort is normal.     Breath sounds: Normal breath sounds. No wheezing, rhonchi or rales.  Musculoskeletal:     Cervical back: Neck supple.  Skin:    General: Skin is warm.     Findings: No rash.  Neurological:     Mental Status: She is alert and oriented to person, place, and time.  Psychiatric:        Behavior: Behavior normal.    The plan was reviewed with the patient/family, and all questions/concerned were addressed.  It was my pleasure to see Favour today and participate in her care. Please feel free to contact me with any questions or concerns.  Sincerely,  Wyline Mood, DO Allergy & Immunology  [facility] office: [phone] [facility] office: [phone]

## 2022-08-09 ENCOUNTER — Ambulatory Visit (INDEPENDENT_AMBULATORY_CARE_PROVIDER_SITE_OTHER): Payer: Medicare Other | Admitting: Allergy

## 2022-08-09 ENCOUNTER — Encounter: Payer: Self-pay | Admitting: Allergy

## 2022-08-09 VITALS — BP 92/68 | HR 79 | Temp 98.0°F | Resp 16 | Ht 63.25 in | Wt 147.5 lb

## 2022-08-09 DIAGNOSIS — R198 Other specified symptoms and signs involving the digestive system and abdomen: Secondary | ICD-10-CM

## 2022-08-09 DIAGNOSIS — K219 Gastro-esophageal reflux disease without esophagitis: Secondary | ICD-10-CM

## 2022-08-09 DIAGNOSIS — Z77120 Contact with and (suspected) exposure to mold (toxic): Secondary | ICD-10-CM

## 2022-08-09 DIAGNOSIS — J310 Chronic rhinitis: Secondary | ICD-10-CM

## 2022-08-09 NOTE — Assessment & Plan Note (Signed)
The past few months patient has not bee feeling right - had headaches, not digesting her food, cold chills, joint pains, burning eyes, rhinorrhea. 2 weeks ago found mold in her water filter which she replaced. Feeling better but still concerned about whether she has mold in her body causing all these issues. Discussed with patient that there is no testing to see if mold caused all these symptoms or if there's mold in the body. The only test from allergy standpoint regarding this is to see if she is allergic to mold. Unable to skin test today as [facility] won't allow new patient visits and procedures on the same day and patient is unable to come off her amitriptyline. See below for environmental control measures for mold. Get bloodwork for IgE panel to mold and environmental allergies.  Will make additional recommendations based on results.  Recommend to follow up with her eye doctor regarding her eye issues and her GI doctor for her GI symptoms due to her past medical history. Unlikely that her current eye and GI symptoms were triggered by her mold exposure.

## 2022-08-09 NOTE — Patient Instructions (Addendum)
Unable to skin test today as [facility] won't allow new patient visits and procedures on the same day.   Mold There is no testing to see if mold caused all these symptoms you had or if there's mold in your body. All I can check for is to see if you have allergies to mold. See below for environmental control measures for mold. Get bloodwork  We are ordering labs, so please allow 1-2 weeks for the results to come back. With the newly implemented Cures Act, the labs might be visible to you at the same time that they become visible to me. However, I will not address the results until all of the results are back, so please be patient.  In the meantime, continue recommendations in your patient instructions, including avoidance measures (if applicable), until you hear from me.  Heartburn: See handout for lifestyle and dietary modifications. Continue famotidine 40mg  daily.   Recommend that you see your eye doctor for your eye issues. Recommend that you see your GI doctor for your stomach and digestion issues.   Follow up if needed depending on the bloodwork results.  Mold Control Mold and fungi can grow on a variety of surfaces provided certain temperature and moisture conditions exist.  Outdoor molds grow on plants, decaying vegetation and soil. The major outdoor mold, Alternaria and Cladosporium, are found in very high numbers during hot and dry conditions. Generally, a late summer - fall peak is seen for common outdoor fungal spores. Rain will temporarily lower outdoor mold spore count, but counts rise rapidly when the rainy period ends. The most important indoor molds are Aspergillus and Penicillium. Dark, humid and poorly ventilated basements are ideal sites for mold growth. The next most common sites of mold growth are the bathroom and the kitchen. Outdoor (Seasonal) Mold Control Use air conditioning and keep windows closed. Avoid exposure to decaying vegetation. Avoid leaf raking. Avoid grain  handling. Consider wearing a face mask if working in moldy areas.  Indoor (Perennial) Mold Control  Maintain humidity below 50%. Get rid of mold growth on hard surfaces with water, detergent and, if necessary, 5% bleach (do not mix with other cleaners). Then dry the area completely. If mold covers an area more than 10 square feet, consider hiring an indoor environmental professional. For clothing, washing with soap and water is best. If moldy items cannot be cleaned and dried, throw them away. Remove sources e.g. contaminated carpets. Repair and seal leaking roofs or pipes. Using dehumidifiers in damp basements may be helpful, but empty the water and clean units regularly to prevent mildew from forming. All rooms, especially basements, bathrooms and kitchens, require ventilation and cleaning to deter mold and mildew growth. Avoid carpeting on concrete or damp floors, and storing items in damp areas.

## 2022-08-09 NOTE — Assessment & Plan Note (Signed)
See handout for lifestyle and dietary modifications. Continue famotidine 40mg  daily. Follow up with GI.

## 2022-08-09 NOTE — Progress Notes (Signed)
New Patient Note  RE: Shelley [name] MRN: [id number] DOB: Jan 13, 1946 Date of Office Visit: 08/09/2022  Consult requested by: Mechele Claude, MD Primary care provider: Mechele Claude, MD  Chief Complaint: Mold Exposure (A few months ago figured out mold was in her water and got really sick from it.  Shelley [name] food wasn't digesting, had trouble sleeping, cold chills in the evening, joint pains,headaches, burning eyes )  History of Present Illness: I had the pleasure of seeing Shelley [name] for initial evaluation at the [facility] on 08/09/2022. She is a 76 y.o. female, who is referred here by Mechele Claude, MD for the evaluation of mold allergy.  A few weeks ago patient found she had mold in her water. She has a charcoal filter and it grew "stuff" on the outside of it. She did replace this with a new one and has been feeling better since then.  She was using this water for washing her food, dishes and body. She is on well water.   She has been sick for the past few months - describes symptoms of headaches, not digesting her food, cold chills in the evenings, joint pains, burning eyes, rhinorrhea. She had cataract surgery in the past and has dry eyes. She has not followed up with them for 4 years now.  She also has underlying arthritis but the joint pain was different.   She did not have vomiting or diarrhea with this. She did have a few episodes of nausea.  Patient is feeling better now since she fixed her filter however she is still feeling like she is not digesting her foods as well. She has history of H. Pylori, GERD, hiatal hernia and IBS.   Anosmia: no. She has used fluticasone and saline spray with some improvement in symptoms. Sinus infections: none. Previous work up includes: no. Previous ENT evaluation: patient had rhinoplasty in 76. Last eye exam: last one was 4 years ago. History of reflux: takes famotidine 40mg  daily. Patient has not seen her GI for awhile.    Assessment and Plan: Shelley [name] is a 76 y.o. female with: Suspected exposure to mold The past few months patient has not bee feeling right - had headaches, not digesting her food, cold chills, joint pains, burning eyes, rhinorrhea. 2 weeks ago found mold in her water filter which she replaced. Feeling better but still concerned about whether she has mold in her body causing all these issues. Discussed with patient that there is no testing to see if mold caused all these symptoms or if there's mold in the body. The only test from allergy standpoint regarding this is to see if she is allergic to mold. Unable to skin test today as [name] won't allow new patient visits and procedures on the same day and patient is unable to come off her amitriptyline. See below for environmental control measures for mold. Get bloodwork for IgE panel to mold and environmental allergies.  Will make additional recommendations based on results.  Recommend to follow up with her eye doctor regarding her eye issues and her GI doctor for her GI symptoms due to her past medical history. Unlikely that her current eye and GI symptoms were triggered by her mold exposure.   GERD (gastroesophageal reflux disease) See handout for lifestyle and dietary modifications. Continue famotidine 40mg  daily. Follow up with GI.  Return if symptoms worsen or fail to improve.  No orders of the defined types were placed in this encounter.  Lab  Orders         Allergens w/Total IgE Area 2         Allergen Profile, Mold      Other allergy screening: Asthma: no Food allergy:  dairy causes nausea/vomiting after a few days. Tolerates ice cream with no issues.  Medication allergy: yes Hymenoptera allergy: no Urticaria: no Eczema:no History of recurrent infections suggestive of immunodeficency: no  Diagnostics: Skin Testing: Unable to skin test today as [facility] won't allow new patient visits and procedures on the same day.    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   BLADDER PROLAPSE 04/24/2007   SYMPTOM, ENLARGEMENT, LYMPH NODES 04/24/2007   GASTRITIS 01/03/2007   OSTEOARTHROSIS NOS, HAND 10/23/2006   DEPRESSION, MAJOR, RECURRENT 10/17/2006   ANXIETY 10/17/2006   HEMORRHOIDS, NOS 10/17/2006   GERD (gastroesophageal reflux disease) 10/17/2006   DIVERTICULITIS OF COLON, NOS 10/17/2006   IRRITABLE BOWEL SYNDROME 10/17/2006   Past Medical History:  Diagnosis Date   Anxiety    Arthritis    osteoarthritis- back, knees, hands   Chronic fatigue syndrome    Complication of anesthesia    had decreased temp   Depression    Diverticulosis    Eczema    GERD (gastroesophageal reflux disease)    History of hiatal hernia    IBS (irritable bowel syndrome)    Internal hemorrhoids    Seizures (HCC)    migraines   Past Surgical History: Past Surgical History:  Procedure Laterality Date   ABDOMINAL HYSTERECTOMY     BLADDER SUSPENSION     Prolasped bladder/mesh: revision surgeon -[facility] "remains with retention and constipation"needing more surgery"   BUNIONECTOMY     bilateral   CATARACT EXTRACTION, BILATERAL Bilateral    COLONOSCOPY WITH PROPOFOL N/A 01/05/2015   Procedure: COLONOSCOPY WITH PROPOFOL;  Surgeon: Willis Modena, MD;  Location: [facility];  Service: Endoscopy;  Laterality: N/A;   ESOPHAGOGASTRODUODENOSCOPY (EGD) WITH PROPOFOL N/A 01/05/2015   Procedure: ESOPHAGOGASTRODUODENOSCOPY (EGD) WITH PROPOFOL;  Surgeon: Willis Modena, MD;  Location: [facility];  Service: Endoscopy;  Laterality: N/A;   FACELIFT W/BLEPHAROPLASTY     NASAL RECONSTRUCTION     THERAPEUTIC ABORTION     '93   Medication List:  Current Outpatient Medications  Medication Sig Dispense Refill   acetaminophen (TYLENOL) 500 MG tablet Take 1,000 mg by mouth every 8 (eight) hours as needed (for headaches).      amitriptyline (ELAVIL) 150 MG tablet Take 150 mg by mouth at bedtime.       BIOTIN PO Take by mouth.     bismuth subsalicylate (PEPTO BISMOL) 262 MG chewable tablet Chew 524 mg by mouth 3 (three) times daily as needed (for stomach pain).      famotidine (PEPCID) 20 MG tablet Take 1 tablet (20 mg total) by mouth 2 (two) times daily. 180 tablet 3   LORazepam (ATIVAN) 0.5 MG tablet Take 0.5 mg by mouth See admin instructions. Take 0.5 mg by mouth at bedtime and 0.5 mg one to two  times a day as needed for anxiety     simethicone (MYLICON) 125 MG chewable tablet Chew 125 mg by mouth every 6 (six) hours as needed for flatulence.      YUVAFEM 10 MCG TABS vaginal tablet Place vaginally.     pantoprazole (PROTONIX) 40 MG tablet Take 1 tablet (40 mg total) by mouth daily. For stomach (Patient not taking: Reported on 08/09/2022) 30 tablet 11   No current facility-administered medications for this visit.   Allergies: Allergies  Allergen Reactions   Nitrofurantoin Nausea And Vomiting   Penicillins Itching and Rash    Has patient had a PCN reaction causing immediate rash, facial/tongue/throat swelling, SOB or lightheadedness with hypotension: Yes Has patient had a PCN reaction causing severe rash involving mucus membranes or skin necrosis: Yes Has patient had a PCN reaction that required hospitalization: No Has patient had a PCN reaction occurring within the last 10 years: Unk If all of the above answers are "NO", then may proceed with Cephalosporin use.    Aspirin Nausea And Vomiting, Rash and Other (See Comments)    Stomach pain, also     Chlordiazepoxide Other (See Comments)    Flushing    Chlordiazepoxide Hcl Nausea And Vomiting and Nausea Only    Other reaction(s): GI Upset (intolerance), Other (See Comments), Unknown flushing    Demerol [Meperidine Hcl] Other (See Comments)    Burning sensation all over   Meperidine Itching and Other (See Comments)    Burning sensation all over/Body was burning up    Povidone Iodine Rash    Bumps, burning also   Povidone-Iodine Itching, Rash and Other (See Comments)    Burning and itching and bumps, burning    Vancomycin Hives   Lactose Nausea And Vomiting and Other (See Comments)    GI upset    Meperidine Hcl Other (See Comments)    Burning and tingling   Milk-Related Compounds Nausea And Vomiting   Nexium [Esomeprazole] Nausea And Vomiting   Omeprazole Nausea And Vomiting   Protonix [Pantoprazole Sodium] Nausea And Vomiting   Amoxicillin Itching and Rash   Codeine Nausea And Vomiting   Iodinated Contrast Media Itching   Lactalbumin Nausea And Vomiting   Whey Nausea And Vomiting   Social History: Social History   Socioeconomic History   Marital status: Divorced    Spouse name: Not on file   Number of children: 2   Years of education: Not on file   Highest education level: Not on file  Occupational History   Occupation: Disabled    Employer: RETIRED  Tobacco Use   Smoking status: Never   Smokeless tobacco: Never  Vaping Use   Vaping Use: Never used  Substance and Sexual Activity   Alcohol use: No   Drug use: No   Sexual activity: Not on file  Other Topics Concern   Not on file  Social History Narrative   Not on file   Social Determinants of Health   Financial Resource Strain: Not on file  Food Insecurity: Not on file  Transportation Needs: Not on file  Physical Activity: Not on file  Stress: Not on file  Social Connections: Not on file   Lives in a 74 year old house. Smoking: denies Occupation: not employed  Environmental HistoryArt gallery manager: Water  Damage/mildew in the house:  not sure Carpet in the family room: no Carpet in the bedroom: no Heating: heat pump Cooling: heat pump Pet: no  Family History: Family History  Problem Relation Age of  Onset   Emphysema Mother        Died at 84   Heart failure Father    Hypertension Father    Alcohol abuse Sister        x2   Diabetes type II Brother    Alcohol abuse Brother        x3   Asthma Maternal Grandfather    Review of Systems  Constitutional:  Positive for chills. Negative for appetite change, fever and unexpected weight change.  HENT:  Negative for congestion and rhinorrhea.   Eyes:  Negative for itching.  Respiratory:  Negative for cough, chest tightness, shortness of breath and wheezing.   Cardiovascular:  Negative for chest pain.  Gastrointestinal:  Negative for abdominal pain.       Unable to digest food  Genitourinary:  Negative for difficulty urinating.  Skin:  Negative for rash.  Neurological:  Positive for headaches.    Objective: BP 92/68   Pulse 79   Temp 98 F (36.7 C)   Resp 16   Ht 5' 3.25" (1.607 m)   Wt 147 lb 8 oz (66.9 kg)   SpO2 97%   BMI 25.92 kg/m  Body mass index is 25.92 kg/m. Physical Exam Vitals and nursing note reviewed.  Constitutional:      Appearance: Normal appearance. She is well-developed.  HENT:     Head: Normocephalic and atraumatic.     Right Ear: Tympanic membrane and external ear normal.     Left Ear: Tympanic membrane and external ear normal.     Nose: Nose normal.     Mouth/Throat:     Mouth: Mucous membranes are moist.     Pharynx: Oropharynx is clear.  Eyes:     Conjunctiva/sclera: Conjunctivae normal.  Cardiovascular:     Rate and Rhythm: Normal rate and regular rhythm.     Heart sounds: Normal heart sounds. No murmur heard.    No friction rub. No gallop.  Pulmonary:     Effort: Pulmonary effort is normal.     Breath sounds: Normal breath sounds. No wheezing, rhonchi or rales.  Musculoskeletal:      Cervical back: Neck supple.  Skin:    General: Skin is warm.     Findings: No rash.  Neurological:     Mental Status: She is alert and oriented to person, place, and time.    The plan was reviewed with the patient/family, and all questions/concerned were addressed.  It was my pleasure to see Alberto today and participate in her care. Please feel free to contact me with any questions or concerns.  Sincerely,  Wyline Mood, DO Allergy & Immunology  [facility] office: [phone] [facility] office: [phone]

## 2022-08-18 LAB — ALLERGEN PROFILE, MOLD
Aureobasidi Pullulans IgE: <0.1 kU/L
Candida Albicans IgE: <0.1 kU/L
M009-IgE Fusarium proliferatum: <0.1 kU/L
M014-IgE Epicoccum purpur: <0.1 kU/L
Mucor Racemosus IgE: <0.1 kU/L
Phoma Betae IgE: <0.1 kU/L
Setomelanomma Rostrat: <0.1 kU/L
Stemphylium Herbarum IgE: <0.1 kU/L

## 2022-08-18 LAB — ALLERGENS W/TOTAL IGE AREA 2
Alternaria Alternata IgE: <0.1 kU/L
Aspergillus Fumigatus IgE: <0.1 kU/L
Bermuda Grass IgE: <0.1 kU/L
Cat Dander IgE: <0.1 kU/L
Cedar, Mountain IgE: <0.1 kU/L
Cladosporium Herbarum IgE: <0.1 kU/L
Cockroach, German IgE: <0.1 kU/L
Common Silver Birch IgE: <0.1 kU/L
Cottonwood IgE: <0.1 kU/L
D Farinae IgE: <0.1 kU/L
D Pteronyssinus IgE: <0.1 kU/L
Dog Dander IgE: <0.1 kU/L
Elm, American IgE: <0.1 kU/L
IgE (Immunoglobulin E), Serum: 5 IU/mL — ABNORMAL LOW (ref 6–495)
Johnson Grass IgE: <0.1 kU/L
Maple/Box Elder IgE: <0.1 kU/L
Mouse Urine IgE: <0.1 kU/L
Oak, White IgE: <0.1 kU/L
Pecan, Hickory IgE: <0.1 kU/L
Penicillium Chrysogen IgE: <0.1 kU/L
Pigweed, Rough IgE: <0.1 kU/L
Ragweed, Short IgE: <0.1 kU/L
Sheep Sorrel IgE Qn: <0.1 kU/L
Timothy Grass IgE: <0.1 kU/L
White Mulberry IgE: <0.1 kU/L

## 2022-08-20 NOTE — Progress Notes (Signed)
Please call patient. Reviewed blood work. Environmental panel was negative to indoor/outdoor allergen and mold panel was negative as well.  Recommend to follow up with her eye doctor regarding her eye issues and her GI doctor for her GI symptoms due to her past medical history. Unlikely that her current eye and GI symptoms were triggered by her mold exposure.

## 2022-09-19 ENCOUNTER — Encounter: Payer: Self-pay | Admitting: Family Medicine

## 2022-09-25 ENCOUNTER — Encounter: Payer: 59 | Admitting: Family Medicine

## 2023-02-04 ENCOUNTER — Telehealth: Payer: Self-pay | Admitting: Family Medicine

## 2023-02-05 NOTE — Telephone Encounter (Signed)
Called patient, no answer, left message to return call 

## 2023-02-08 NOTE — Telephone Encounter (Signed)
Called patient, no answer, left message to return call 

## 2023-02-27 ENCOUNTER — Encounter: Payer: 59 | Admitting: Family Medicine

## 2023-02-27 ENCOUNTER — Encounter: Payer: Self-pay | Admitting: Family Medicine

## 2023-06-04 ENCOUNTER — Other Ambulatory Visit: Payer: Self-pay | Admitting: Nurse Practitioner

## 2023-06-04 DIAGNOSIS — Z1231 Encounter for screening mammogram for malignant neoplasm of breast: Secondary | ICD-10-CM

## 2023-06-05 ENCOUNTER — Ambulatory Visit: Payer: 59

## 2023-06-05 DIAGNOSIS — Z1231 Encounter for screening mammogram for malignant neoplasm of breast: Secondary | ICD-10-CM | POA: Diagnosis not present

## 2023-06-10 ENCOUNTER — Other Ambulatory Visit: Payer: Self-pay | Admitting: Nurse Practitioner

## 2023-06-10 DIAGNOSIS — E2839 Other primary ovarian failure: Secondary | ICD-10-CM

## 2023-06-14 ENCOUNTER — Ambulatory Visit: Payer: 59

## 2023-06-14 DIAGNOSIS — M810 Age-related osteoporosis without current pathological fracture: Secondary | ICD-10-CM

## 2023-06-14 DIAGNOSIS — Z13820 Encounter for screening for osteoporosis: Secondary | ICD-10-CM

## 2023-06-14 DIAGNOSIS — E2839 Other primary ovarian failure: Secondary | ICD-10-CM

## 2023-06-21 ENCOUNTER — Other Ambulatory Visit: Payer: Self-pay | Admitting: Family Medicine

## 2023-07-03 ENCOUNTER — Telehealth: Payer: Self-pay | Admitting: Internal Medicine

## 2023-07-03 NOTE — Telephone Encounter (Signed)
Good morning Dr. Leonides Schanz,     We received a referral for patient for irritable bowel syndrome. Patient has history with Dr. Dulce Sellar and last had a colonoscopy in 2016. Patient is wishing to transfer to you specifically due to being unhappy with care and feel as though her symptoms have not been resolving. Patient is also wishing to be seen for hiatal hernia. Patient's previous records are in [facility] for you to review and advise on scheduling.     Thank you

## 2023-07-03 NOTE — Telephone Encounter (Signed)
Okay to schedule for an OV.  EGD 01/05/15: Mild gastritis biopsied. Path: H pylori positive  Colonoscopy 01/05/15: Good prep. External hemorrhoids. Scattered diverticula throughout the colon. Three 2-5 mm ascending/transverse colon polyps (lymphoid tissue) that were removed with cold snare/cold forceps.  Anorectal manometry 10/01/17: Impression: - Resting study reveals a weak internal anal sphincter pressure.  - Squeeze study reveals a weak external anal sphincter pressure.  - RAIR (Rectoanal Inhibitory Reflex) is present suggesting absence of Hirschsprung's disease.  - Sensation study reveals an elevated first sensation threshold with an elevated urge threshold and normal maximal tolerated volume threshold.  - Unable to expel defecation balloon.  - Strain manuever reveals an increase in pelvic floor activity with strain.  Colonoscopy 10/09/17: Decreased sphincter tone on DRE. Torturous colon. One 3 mm descending colon polyp (TA). Diverticulosis. Internal hemorrhoids. Repeat colon recommended in 5 years

## 2023-07-04 NOTE — Telephone Encounter (Signed)
Called patient to discuss scheduling. Left voicemail.

## 2023-07-24 ENCOUNTER — Other Ambulatory Visit: Payer: Self-pay | Admitting: Family Medicine

## 2023-07-24 ENCOUNTER — Encounter: Payer: Self-pay | Admitting: Family Medicine

## 2023-07-24 NOTE — Telephone Encounter (Signed)
Stacks pt NTBS 30-d given 06/21/23

## 2023-07-24 NOTE — Telephone Encounter (Signed)
LMTCB TO SCHEDULE APPT LETTER MAILED 

## 2023-07-28 ENCOUNTER — Other Ambulatory Visit: Payer: Self-pay | Admitting: Family Medicine

## 2023-09-12 ENCOUNTER — Emergency Department (HOSPITAL_COMMUNITY)
Admission: EM | Admit: 2023-09-12 | Discharge: 2023-09-12 | Discharge status: Against Medical Advice | Payer: 59 | Attending: Emergency Medicine | Admitting: Emergency Medicine

## 2023-09-12 ENCOUNTER — Other Ambulatory Visit: Payer: Self-pay

## 2023-09-12 ENCOUNTER — Encounter (HOSPITAL_COMMUNITY): Payer: Self-pay | Admitting: Emergency Medicine

## 2023-09-12 DIAGNOSIS — H5789 Other specified disorders of eye and adnexa: Secondary | ICD-10-CM | POA: Insufficient documentation

## 2023-09-12 DIAGNOSIS — Z5321 Procedure and treatment not carried out due to patient leaving prior to being seen by health care provider: Secondary | ICD-10-CM | POA: Diagnosis not present

## 2023-09-12 NOTE — ED Notes (Signed)
Patient called for vitals patient called for bed no answer

## 2023-09-12 NOTE — ED Triage Notes (Addendum)
Pt reports right eye pain and blurred vision x 5 days. Pt also reports right eye drainage.

## 2023-09-12 NOTE — ED Provider Triage Note (Signed)
Emergency Medicine Provider Triage Evaluation Note  KEAH PRUSS , a 78 y.o. female  was evaluated in triage.  Pt complains of left eye burning and discharge x 4 days.  History of previous surgeries to this eye.  Complains of pain in the eye, no vision changes.  Review of Systems  Positive: Eye discomfort Negative: Injury  Physical Exam  BP 126/76 (BP Location: Right Arm)   Pulse 82   Temp 97.8 F (36.6 C)   Resp 16   SpO2 100%  Gen:   Awake, no distress   Resp:  Normal effort  MSK:   Moves extremities without difficulty  Other:    Medical Decision Making  Medically screening exam initiated at 5:08 PM.  Appropriate orders placed.  CARINNE BARINA was informed that the remainder of the evaluation will be completed by another provider, this initial triage assessment does not replace that evaluation, and the importance of remaining in the ED until their evaluation is complete.     Arthor Captain, PA-C 09/12/23 1709

## 2023-09-13 ENCOUNTER — Encounter (HOSPITAL_COMMUNITY): Payer: Self-pay | Admitting: Emergency Medicine

## 2023-09-13 ENCOUNTER — Emergency Department (HOSPITAL_COMMUNITY)
Admission: EM | Admit: 2023-09-13 | Discharge: 2023-09-13 | Disposition: A | Discharge status: Home / Self Care | Payer: 59 | Attending: Student | Admitting: Student

## 2023-09-13 DIAGNOSIS — X58XXXA Exposure to other specified factors, initial encounter: Secondary | ICD-10-CM | POA: Diagnosis not present

## 2023-09-13 DIAGNOSIS — H1089 Other conjunctivitis: Secondary | ICD-10-CM | POA: Diagnosis not present

## 2023-09-13 DIAGNOSIS — S0501XA Injury of conjunctiva and corneal abrasion without foreign body, right eye, initial encounter: Secondary | ICD-10-CM

## 2023-09-13 DIAGNOSIS — S0502XA Injury of conjunctiva and corneal abrasion without foreign body, left eye, initial encounter: Secondary | ICD-10-CM | POA: Insufficient documentation

## 2023-09-13 DIAGNOSIS — H1033 Unspecified acute conjunctivitis, bilateral: Secondary | ICD-10-CM

## 2023-09-13 DIAGNOSIS — S0993XA Unspecified injury of face, initial encounter: Secondary | ICD-10-CM | POA: Diagnosis present

## 2023-09-13 MED ORDER — FLUORESCEIN SODIUM 1 MG OP STRP
1.0000 | ORAL_STRIP | Freq: Once | OPHTHALMIC | Status: AC
  Administered 2023-09-13: 1 via OPHTHALMIC
  Filled 2023-09-13: qty 1

## 2023-09-13 MED ORDER — ERYTHROMYCIN 5 MG/GM OP OINT
1.0000 | TOPICAL_OINTMENT | Freq: Once | OPHTHALMIC | Status: AC
  Administered 2023-09-13: 1 via OPHTHALMIC
  Filled 2023-09-13: qty 3.5

## 2023-09-13 MED ORDER — TETRACAINE HCL 0.5 % OP SOLN
1.0000 [drp] | Freq: Once | OPHTHALMIC | Status: AC
  Administered 2023-09-13: 1 [drp] via OPHTHALMIC
  Filled 2023-09-13: qty 4

## 2023-09-13 NOTE — ED Triage Notes (Addendum)
  Patient comes in with R eye pain and drainage that has been going on for 5 days.  Patient states she has blurry vision and has thick drainage when laying down.  Was here earlier but left after triage.  Pain 4/10, pressure.    States she started taking estrogen replacement pills last week.

## 2023-09-13 NOTE — Discharge Instructions (Signed)
Use erythromycin ointment 3 times daily for the next 5 days

## 2023-09-13 NOTE — ED Provider Notes (Signed)
[place] EMERGENCY DEPARTMENT AT [facility] Provider Note  CSN: [id number] Arrival date & time: 09/13/23 0554  Chief Complaint(s) Eye Pain  HPI Shelley [name] is a 78 y.o. female patient seen emergency room for evaluation of eye pain and discharge.  States that symptoms have been present for the last 5 days where she will wake from sleep with her eyes crusted shut with pus.  Endorses some mild eye pain worse on the right and some mild blurry vision.  Endorses foreign body sensation on the right.  Denies chest pain, shortness of breath, headache, fever or other systemic symptoms.   Past Medical History Past Medical History:  Diagnosis Date   Anxiety    Arthritis    osteoarthritis- back, knees, hands   Chronic fatigue syndrome    Complication of anesthesia    had decreased temp   Depression    Diverticulosis    Eczema    GERD (gastroesophageal reflux disease)    History of hiatal hernia    IBS (irritable bowel syndrome)    Internal hemorrhoids    Seizures (HCC)    migraines   Patient Active Problem List   Diagnosis Date Noted   Suspected exposure to mold 08/09/2022   Chronic rhinitis 08/09/2022   Colitis 06/13/2018   Depression with anxiety 06/13/2018   Hyponatremia 06/13/2018   DYSURIA 06/30/2010   INCOMPLETE VOIDING 02/28/2010   RLQ PAIN 02/28/2010   THYROID STIMULATING HORMONE, ABNORMAL 11/17/2009   UNSPECIFIED VITAMIN D DEFICIENCY 11/17/2009   FATIGUE 11/17/2009   LUQ PAIN 11/14/2009   CHANGE IN BOWELS 10/07/2009   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 Authorizing Provider  acetaminophen (TYLENOL) 500 MG tablet Take 1,000 mg by mouth every 8 (eight) hours as needed (for headaches).     [provider]  amitriptyline (ELAVIL) 150 MG tablet Take 150 mg by mouth at bedtime.      [provider]  BIOTIN PO Take by mouth.    [provider]  bismuth subsalicylate (PEPTO BISMOL) 262 MG chewable tablet Chew 524 mg by mouth 3 (three) times daily as needed (for stomach pain).     [provider]  famotidine (PEPCID) 20 MG tablet Take 1 tablet (20 mg total) by mouth 2 (two) times daily. **NEEDS TO BE SEEN BEFORE NEXT REFILL** 06/21/23   Mechele Claude, MD  LORazepam (ATIVAN) 0.5 MG tablet Take 0.5 mg by mouth See admin instructions. Take 0.5 mg by mouth at bedtime and 0.5 mg one to two times a day as needed for anxiety    [provider]  pantoprazole (PROTONIX) 40 MG tablet Take 1 tablet (40 mg total) by mouth daily. For stomach Patient not taking: Reported on 08/09/2022 06/19/22   Mechele Claude, MD  simethicone (MYLICON) 125 MG chewable tablet Chew 125 mg by mouth every 6 (six) hours as needed for flatulence.     [provider]  YUVAFEM 10 MCG TABS vaginal tablet Place vaginally. 08/06/22   [provider]  Past Surgical History Past Surgical History:  Procedure Laterality Date   ABDOMINAL HYSTERECTOMY     BLADDER SUSPENSION     Prolasped bladder/mesh: revision surgeon -[facility] "remains with retention and constipation"needing more surgery"   BUNIONECTOMY     bilateral   CATARACT EXTRACTION, BILATERAL Bilateral    COLONOSCOPY WITH PROPOFOL N/A 01/05/2015   Procedure: COLONOSCOPY WITH PROPOFOL;  Surgeon: Willis Modena, MD;  Location: [facility];  Service: Endoscopy;   Laterality: N/A;   ESOPHAGOGASTRODUODENOSCOPY (EGD) WITH PROPOFOL N/A 01/05/2015   Procedure: ESOPHAGOGASTRODUODENOSCOPY (EGD) WITH PROPOFOL;  Surgeon: Willis Modena, MD;  Location: [facility] ENDOSCOPY;  Service: Endoscopy;  Laterality: N/A;   FACELIFT W/BLEPHAROPLASTY     NASAL RECONSTRUCTION     THERAPEUTIC ABORTION     '93   Family History Family History  Problem Relation Age of Onset   Emphysema Mother        Died at 43   Heart failure Father    Hypertension Father    Alcohol abuse Sister        x2   Diabetes type II Brother    Alcohol abuse Brother        x3   Asthma Maternal Grandfather     Social History Social History   Tobacco Use   Smoking status: Never   Smokeless tobacco: Never  Vaping Use   Vaping status: Never Used  Substance Use Topics   Alcohol use: No   Drug use: No   Allergies Nitrofurantoin, Penicillins, Aspirin, Chlordiazepoxide, Chlordiazepoxide hcl, Demerol [meperidine hcl], Meperidine, Povidone iodine, Povidone-iodine, Vancomycin, Lactose, Meperidine hcl, Milk-related compounds, Nexium [esomeprazole], Omeprazole, Protonix [pantoprazole sodium], Amoxicillin, Codeine, Iodinated contrast media, Lactalbumin, and Whey  Review of Systems Review of Systems  Eyes:  Positive for pain, discharge, redness and visual disturbance.    Physical Exam Vital Signs  I have reviewed the triage vital signs BP (!) 181/79 (BP Location: Left Arm)   Pulse 82   Temp 98.1 F (36.7 C)   Resp 18   Ht 5\' 3"  (1.6 m)   Wt 66.7 kg   SpO2 98%   BMI 26.04 kg/m   Physical Exam Vitals and nursing note reviewed.  Constitutional:      General: She is not in acute distress.    Appearance: She is well-developed.  HENT:     Head: Normocephalic and atraumatic.  Eyes:     General:        Right eye: Discharge present.     Comments: Conjunctival injection on the right  Cardiovascular:     Rate and Rhythm: Normal rate and regular rhythm.     Heart sounds: No murmur  heard. Pulmonary:     Effort: Pulmonary effort is normal. No respiratory distress.     Breath sounds: Normal breath sounds.  Abdominal:     Palpations: Abdomen is soft.     Tenderness: There is no abdominal tenderness.  Musculoskeletal:        General: No swelling.     Cervical back: Neck supple.  Skin:    General: Skin is warm and dry.     Capillary Refill: Capillary refill takes less than 2 seconds.  Neurological:     Mental Status: She is alert.  Psychiatric:        Mood and Affect: Mood normal.     ED Results and Treatments Labs (all labs ordered are listed, but only abnormal results are displayed) Labs Reviewed - No data to display  Radiology No results found.  Pertinent labs & imaging results that were available during my care of the patient were reviewed by me and considered in my medical decision making (see MDM for details).  Medications Ordered in ED Medications  fluorescein ophthalmic strip 1 strip (1 strip Right Eye Given 09/13/23 0651)  tetracaine (PONTOCAINE) 0.5 % ophthalmic solution 1 drop (1 drop Right Eye Given 09/13/23 0651)  erythromycin ophthalmic ointment 1 Application (1 Application Both Eyes Given 09/13/23 0651)                                                                                                                                     Procedures Procedures  (including critical care time)  Medical Decision Making / ED Course   This patient presents to the ED for concern of eye pain and discharge, this involves an extensive number of treatment options, and is a complaint that carries with it a high risk of complications and morbidity.  The differential diagnosis includes right conjunctivitis, viral conjunctivitis, allergic conjunctivitis, keratitis, iritis  MDM: Patient seen emergency room for evaluation of eye pain and  discharge.  Physical exam with a small corneal abrasion on the right, mild conjunctival injection on the right.  Corneal abrasion likely from patient aggressively rubbing the eye in the setting of bacterial conjunctivitis.  Will treat with erythromycin ointment.  Pressures are normal here in the Emergency Department and low suspicion for angle-closure glaucoma.  No surrounding cellulitis and low suspicion for orbital cellulitis.  Patient and resources to follow-up outpatient with ophthalmology.  At this time she does not meet inpatient criteria for admission and will be discharged with outpatient follow-up.   Additional history obtained:  -External records from outside source obtained and reviewed including: Chart review including previous notes, labs, imaging, consultation notes     Medicines ordered and prescription drug management: Meds ordered this encounter  Medications   fluorescein ophthalmic strip 1 strip   tetracaine (PONTOCAINE) 0.5 % ophthalmic solution 1 drop   erythromycin ophthalmic ointment 1 Application    -I have reviewed the patients home medicines and have made adjustments as needed  Critical interventions none    Social Determinants of Health:  Factors impacting patients care include: none   Reevaluation: After the interventions noted above, I reevaluated the patient and found that they have :improved  Co morbidities that complicate the patient evaluation  Past Medical History:  Diagnosis Date   Anxiety    Arthritis    osteoarthritis- back, knees, hands   Chronic fatigue syndrome    Complication of anesthesia    had decreased temp   Depression    Diverticulosis    Eczema    GERD (gastroesophageal reflux disease)    History of hiatal hernia    IBS (irritable bowel syndrome)    Internal hemorrhoids    Seizures (HCC)    migraines      Dispostion: I  considered admission for this patient, but at this time she does not meet inpatient criteria for  admission and will be discharged with outpatient follow-up     Final Clinical Impression(s) / ED Diagnoses Final diagnoses:  Abrasion of right cornea, initial encounter  Acute bacterial conjunctivitis of both eyes     @PCDICTATION @    Glendora Score, MD 09/13/23 [phone]

## 2023-09-30 ENCOUNTER — Encounter: Payer: Self-pay | Admitting: Physician Assistant

## 2023-11-13 ENCOUNTER — Other Ambulatory Visit: Payer: Self-pay | Admitting: Physician Assistant

## 2023-11-13 ENCOUNTER — Ambulatory Visit: Payer: 59 | Admitting: Physician Assistant

## 2023-11-13 VITALS — BP 110/78 | HR 82 | Ht 63.0 in | Wt 146.2 lb

## 2023-11-13 DIAGNOSIS — K219 Gastro-esophageal reflux disease without esophagitis: Secondary | ICD-10-CM | POA: Diagnosis not present

## 2023-11-13 DIAGNOSIS — Z860101 Personal history of adenomatous and serrated colon polyps: Secondary | ICD-10-CM

## 2023-11-13 DIAGNOSIS — Z87448 Personal history of other diseases of urinary system: Secondary | ICD-10-CM

## 2023-11-13 DIAGNOSIS — R1310 Dysphagia, unspecified: Secondary | ICD-10-CM

## 2023-11-13 DIAGNOSIS — N329 Bladder disorder, unspecified: Secondary | ICD-10-CM

## 2023-11-13 DIAGNOSIS — K5900 Constipation, unspecified: Secondary | ICD-10-CM

## 2023-11-13 DIAGNOSIS — Z8619 Personal history of other infectious and parasitic diseases: Secondary | ICD-10-CM

## 2023-11-13 DIAGNOSIS — K5904 Chronic idiopathic constipation: Secondary | ICD-10-CM

## 2023-11-13 DIAGNOSIS — R1319 Other dysphagia: Secondary | ICD-10-CM

## 2023-11-13 MED ORDER — PEG-KCL-NACL-NASULF-NA ASC-C 100 G PO SOLR: 1.0000 | Freq: Once | ORAL | 0 refills | Status: AC

## 2023-11-13 MED ORDER — SUCRALFATE 1 G PO TABS: 1.0000 g | ORAL_TABLET | Freq: Three times a day (TID) | ORAL | 0 refills | Status: DC

## 2023-11-13 NOTE — Patient Instructions (Addendum)
 We have sent the following medications to your pharmacy for you to pick up at your convenience:  Sending a medication called Carafate, this mechanically coats your stomach. Can cause constipation and darker stools. Take about 30 mins to 1 hour before food and before bed.  If the pill is too large to take, can dissolve it in water and a small orange juice glass or shot glass and take it more as a liquid.  Miralax is an osmotic laxative.  It only brings more water into the stool.  This is safe to take daily.  Can take up to 17 gram of miralax twice a day.  Mix with juice or coffee.  Start 1 capful at night for 3-4 days and reassess your response in 3-4 days.  You can increase and decrease the dose based on your response.  Remember, it can take up to 3-4 days to take effect OR for the effects to wear off.   I often pair this with benefiber in the morning to help assure the stool is not too loose.   Toileting tips to help with your constipation - Drink at least 64-80 ounces of water/liquid per day. - Establish a time to try to move your bowels every day.  For many people, this is after a cup of coffee or after a meal such as breakfast. - Sit all of the way back on the toilet keeping your back fairly straight and while sitting up, try to rest the tops of your forearms on your upper thighs.   - Raising your feet with a step stool/squatty potty can be helpful to improve the angle that allows your stool to pass through the rectum. - Relax the rectum feeling it bulge toward the toilet water.  If you feel your rectum raising toward your body, you are contracting rather than relaxing. - Breathe in and slowly exhale. "Belly breath" by expanding your belly towards your belly button. Keep belly expanded as you gently direct pressure down and back to the anus.  A low pitched GRRR sound can assist with increasing intra-abdominal pressure.  (Can also trying to blow on a pinwheel and make it move, this helps  with the same belly breathing) - Repeat 3-4 times. If unsuccessful, contract the pelvic floor to restore normal tone and get off the toilet.  Avoid excessive straining. - To reduce excessive wiping by teaching your anus to normally contract, place hands on outer aspect of knees and resist knee movement outward.  Hold 5-10 second then place hands just inside of knees and resist inward movement of knees.  Hold 5 seconds.  Repeat a few times each way.  Go to the ER if unable to pass gas, severe AB pain, unable to hold down food, any shortness of breath of chest pain.  You have been scheduled for an endoscopy and colonoscopy. Please follow the written instructions given to you at your visit today.  If you use inhalers (even only as needed), please bring them with you on the day of your procedure.  DO NOT TAKE 7 DAYS PRIOR TO TEST- Trulicity (dulaglutide) Ozempic, Wegovy (semaglutide) Mounjaro (tirzepatide) Bydureon Bcise (exanatide extended release)  DO NOT TAKE 1 DAY PRIOR TO YOUR TEST Rybelsus (semaglutide) Adlyxin (lixisenatide) Victoza (liraglutide) Byetta (exanatide)  Due to recent changes in healthcare laws, you may see the results of your imaging and laboratory studies on MyChart before your provider has had a chance to review them.  We understand that in some cases there  may be results that are confusing or concerning to you. Not all laboratory results come back in the same time frame and the provider may be waiting for multiple results in order to interpret others.  Please give [place] 48 hours in order for your provider to thoroughly review all the results before contacting the office for clarification of your results.  _______________________________________________________  If your blood pressure at your visit was 140/90 or greater, please contact your primary care physician to follow up on this.  _______________________________________________________  If you are age 76 or older,  your body mass index should be between 23-30. Your Body mass index is 25.9 kg/m. If this is out of the aforementioned range listed, please consider follow up with your Primary Care Provider.  If you are age 60 or younger, your body mass index should be between 19-25. Your Body mass index is 25.9 kg/m. If this is out of the aformentioned range listed, please consider follow up with your Primary Care Provider.   ________________________________________________________  The [facility] providers would like to encourage you to use [facility] to communicate with providers for non-urgent requests or questions.  Due to long hold times on the telephone, sending your provider a message by [facility] may be a faster and more efficient way to get a response.  Please allow 48 business hours for a response.  Please remember that this is for non-urgent requests.  _______________________________________________________  ___________________________________________________________________________  Here some information about pelvic floor dysfunction. This may be contributing to some of your symptoms. We will continue with our evaluation but I do want you to consider adding on fiber supplement with low-dose MiraLAX daily. We could also refer to pelvic floor physical therapy.   Pelvic Floor Dysfunction, Female Pelvic floor dysfunction (PFD) is a condition that results when the group of muscles and connective tissues that support the organs in the pelvis (pelvic floor muscles) do not work well. These muscles and their connections form a sling that supports the colon and bladder. In women, they also support the uterus. PFD causes pelvic floor muscles to be too weak, too tight, or both. In PFD, muscle movements are not coordinated. This may cause bowel or bladder problems. It may also cause pain. What are the causes? This condition may be caused by an injury to the pelvic area or by a weakening of pelvic muscles. This  often results from pregnancy and childbirth or other types of strain. In many cases, the exact cause is not known. What increases the risk? The following factors may make you more likely to develop this condition: Having chronic bladder tissue inflammation (interstitial cystitis). Being an older person. Being overweight. History of radiation treatment for cancer in the pelvic region. Previous pelvic surgery, such as removal of the uterus (hysterectomy). What are the signs or symptoms? Symptoms of this condition vary and may include: Bladder symptoms, such as: Trouble starting urination and emptying the bladder. Frequent urinary tract infections. Leaking urine when coughing, laughing, or exercising (stress incontinence). Having to pass urine urgently or frequently. Pain when passing urine. Bowel symptoms, such as: Constipation. Urgent or frequent bowel movements. Incomplete bowel movements. Painful bowel movements. Leaking stool or gas. Unexplained genital or rectal pain. Genital or rectal muscle spasms. Low back pain. Other symptoms may include: A heavy, full, or aching feeling in the vagina. A bulge that protrudes into the vagina. Pain during or after sex. How is this diagnosed? This condition may be diagnosed based on: Your symptoms and medical history.  A physical exam. During the exam, your health care provider may check your pelvic muscles for tightness, spasm, pain, or weakness. This may include a rectal exam and a pelvic exam. In some cases, you may have diagnostic tests, such as: Electrical muscle function tests. Urine flow testing. X-ray tests of bowel function. Ultrasound of the pelvic organs. How is this treated? Treatment for this condition depends on the symptoms. Treatment options include: Physical therapy. This may include Kegel exercises to help relax or strengthen the pelvic floor muscles. Biofeedback. This type of therapy provides feedback on how tight your  pelvic floor muscles are so that you can learn to control them. Internal or external massage therapy. A treatment that involves electrical stimulation of the pelvic floor muscles to help control pain (transcutaneous electrical nerve stimulation, or TENS). Sound wave therapy (ultrasound) to reduce muscle spasms. Medicines, such as: Muscle relaxants. Bladder control medicines. Surgery to reconstruct or support pelvic floor muscles may be an option if other treatments do not help. Follow these instructions at home: Activity Do your usual activities as told by your health care provider. Ask your health care provider if you should modify any activities. Do pelvic floor strengthening or relaxing exercises at home as told by your physical therapist. Lifestyle Maintain a healthy weight. Eat foods that are high in fiber, such as beans, whole grains, and fresh fruits and vegetables. Limit foods that are high in fat and processed sugars, such as fried or sweet foods. Manage stress with relaxation techniques such as yoga or meditation. General instructions If you have problems with leakage: Use absorbable pads or wear padded underwear. Wash frequently with mild soap. Keep your genital and anal area as clean and dry as possible. Ask your health care provider if you should try a barrier cream to prevent skin irritation. Take warm baths to relieve pelvic muscle tension or spasms. Take over-the-counter and prescription medicines only as told by your health care provider. Keep all follow-up visits. How is this prevented? The cause of PFD is not always known, but there are a few things you can do to reduce the risk of developing this condition, including: Staying at a healthy weight. Getting regular exercise. Managing stress. Contact a health care provider if: Your symptoms are not improving with home care. You have signs or symptoms of PFD that get worse at home. You develop new signs or  symptoms. You have signs of a urinary tract infection, such as: Fever. Chills. Increased urinary frequency. A burning feeling when urinating. You have not had a bowel movement in 3 days (constipation). Summary Pelvic floor dysfunction results when the muscles and connective tissues in your pelvic floor do not work well. These muscles and their connections form a sling that supports your colon and bladder. In women, they also support the uterus. PFD may be caused by an injury to the pelvic area or by a weakening of pelvic muscles. PFD causes pelvic floor muscles to be too weak, too tight, or a combination of both. Symptoms may vary from person to person. In most cases, PFD can be treated with physical therapies and medicines. Surgery may be an option if other treatments do not help. This information is not intended to replace advice given to you by your health care provider. Make sure you discuss any questions you have with your health care provider. Document Revised: 12/14/2020 Document Reviewed: 12/14/2020 Elsevier Patient Education  2022 [employer].

## 2023-11-13 NOTE — Progress Notes (Signed)
 11/13/2023 Shelley [name] [id number] [birth year]  Referring provider: Estevan Oaks, NP Primary GI doctor: Dr. Leonides Schanz  ASSESSMENT AND PLAN:   GERD with history of HH and H pylori, upper to mid esophagus, has had mostly regurgitation of pills, never liquids EGD 2016 + Hpylori gastritis, treated On pepcid 40 mg once daily, can not tolerate omeprazole, nexium, protonix No weight loss, no blood in stool, has had dark stools but was on pepto Schedule EGD with possible dilatation to evaluate for stenosis, tumor, erosive/infectious esophagititis, and EOE, H pylori, gastritis.   If the EGD is negative, can consider barium swallow  I discussed risks of EGD with patient today, including risk of sedation, bleeding or perforation.  Patient provides understanding and gave verbal consent to proceed. In the interim patient advised about swallowing precautions.  Eat slowly, chew food well before swallowing.  Drink liquids in between each bite to avoid food impaction. Can do trial of carafate, has a lot of allergies to PPI, ? Voquezna.   History of TA polyps family history of advanced polyps in mom Colon 2019 3 mm TA polyp, decreased rectal tone decreased, tics, hemorrhoids recall 5 years Due for colonoscopy We have discussed the risks of bleeding, infection, perforation, medication reactions, and remote risk of death associated with colonoscopy. All questions were answered and the patient acknowledges these risk and wishes to proceed. Wants moviprep, other preps make her sick.   Patient states she normally does not have air during a colonoscopy for inflation but water due to previous history of pain with air, I do not see this on [facility] colonoscopy, will discuss with Dr. Leonides Schanz  Constipation with history of prolapse bladder Colonoscopy with torturous colon - Increase fiber/ water intake, decrease caffeine, increase activity level. -Will add on Miralax daily and Benefiber -possible  component of pelvis floor dysfunction with history and symptoms.  -Can refer to pelvic floor PT, declines at this time -Consider anal manometry.     Patient Care Team: Estevan Oaks, NP as PCP - General (Nurse Practitioner) Kallie Edward (Inactive) as Referring Physician  HISTORY OF PRESENT ILLNESS: 78 y.o. female with a past medical history listed below presents for dysphagia/GERD.    Discussed the use of AI scribe software for clinical note transcription with the patient, who gave verbal consent to proceed.  History of Present Illness   Shelley [name] is a 78 year old female with reflux and hiatal hernia who presents with swallowing difficulties and constipation.  She experiences a sensation of choking and food regurgitation when swallowing, which has become more frequent. This is often accompanied by throat irritation and hoarseness, especially after consuming sour foods, occurring two to three times a day. She has a history of reflux and hiatal hernia, with a previous endoscopy in 2016 showing diffuse gastritis. She has tried various medications such as omeprazole, Protonix, and Nexium, but could not tolerate them. Currently, she takes Pepcid (famotidine) 20 mg, sometimes twice a day, but often just at night. She describes difficulty swallowing larger pills, such as Elavil, which sometimes feel like they get stuck in the upper chest area, leading to vomiting. This issue occurs occasionally with supplements but not with liquids. No weight loss, but she notes difficulty losing weight and recent weight gain.  She has a history of constipation, which she attributes to dietary changes and a prolapsed bladder. She uses a fiber supplement from [employer], similar to Metamucil, but feels it has become less effective. Occasionally,  she requires an enema. A colonoscopy in 2019 revealed a tortuous colon, a 3 mm tubular adenomatous polyp, multiple diverticula, and non-bleeding internal  hemorrhoids. She experiences constipation if she reduces her fruit intake and sometimes uses an off-brand fiber supplement. No dark black stool or blood in the stool, attributing recent dark stools to Pepto-Bismol tablets.  She has a history of bladder issues, including a prolapsed bladder and previous surgeries in 2011 and 2012. She reports that her bladder is prolapsed again, affecting her bowel movements. She lives alone and sometimes lifts heavy objects, which may exacerbate her condition. She has not engaged in pelvic floor physical therapy recently.  She mentions a family history of advanced polyps in her mother but denies any known family history of colon cancer.  She takes omega XL for arthritis, which has worsened, requiring her to increase the dosage to two tablets due to increased pain in her back and thumb. She is 'sort of short of breath at times' with exertion but denies any chest pain or discomfort with exertion.        She  reports that she has never smoked. She has never used smokeless tobacco. She reports that she does not drink alcohol and does not use drugs.  RELEVANT GI HISTORY, IMAGING AND LABS: Results   LABS High sensitive C-reactive protein: High Rheumatoid factor: Negative Sed rate: Normal  DIAGNOSTIC Colonoscopy: Tortuous colon, 3mm polyp in descending colon, multiple diverticula in sigmoid and descending colon, non-bleeding internal hemorrhoids, decreased sphincter tone (10/09/2017) Endoscopy: Diffuse gastritis, normal esophagus, normal duodenum (2016)  PATHOLOGY Polyp biopsy: Tubular adenomatous polyp (10/09/2017)      CBC    Component Value Date/Time   WBC 7.2 06/27/2021 1529   WBC 6.9 06/14/2018 0346   RBC 4.04 06/27/2021 1529   RBC 3.29 (L) 06/14/2018 0346   HGB 12.6 06/27/2021 1529   HCT 36.2 06/27/2021 1529   PLT 326 06/27/2021 1529   MCV 90 06/27/2021 1529   MCH 31.2 06/27/2021 1529   MCH 29.8 06/14/2018 0346   MCHC 34.8 06/27/2021 1529    MCHC 32.5 06/14/2018 0346   RDW 12.4 06/27/2021 1529   LYMPHSABS 2.6 06/27/2021 1529   MONOABS 0.6 06/14/2018 0346   EOSABS 0.1 06/27/2021 1529   BASOSABS 0.0 06/27/2021 1529   No results for input(s): "HGB" in the last 8760 hours.  CMP     Component Value Date/Time   NA 142 06/27/2021 1529   K 4.5 06/27/2021 1529   CL 102 06/27/2021 1529   CO2 25 06/27/2021 1529   GLUCOSE 107 (H) 06/27/2021 1529   GLUCOSE 93 06/14/2018 0346   BUN 17 06/27/2021 1529   CREATININE 0.87 06/27/2021 1529   CALCIUM 9.6 06/27/2021 1529   PROT 7.3 06/27/2021 1529   ALBUMIN 4.4 06/27/2021 1529   AST 18 06/27/2021 1529   ALT 15 06/27/2021 1529   ALKPHOS 82 06/27/2021 1529   BILITOT <0.2 06/27/2021 1529   GFRNONAA >60 06/14/2018 0346   GFRAA >60 06/14/2018 0346      Latest Ref Rng & Units 06/27/2021    3:29 PM 06/14/2018    3:46 AM 06/12/2018    9:31 PM  Hepatic Function  Total Protein 6.0 - 8.5 g/dL 7.3  5.1  6.6   Albumin 3.7 - 4.7 g/dL 4.4  2.4  3.0   AST 0 - 40 IU/L 18  26  23    ALT 0 - 32 IU/L 15  22  18    Alk Phosphatase 44 -  121 IU/L 82  54  71   Total Bilirubin 0.0 - 1.2 mg/dL <1.6  0.4  0.7       Current Medications:      Current Outpatient Medications (Analgesics):    acetaminophen (TYLENOL) 500 MG tablet, Take 1,000 mg by mouth every 8 (eight) hours as needed (for headaches).    Current Outpatient Medications (Other):    amitriptyline (ELAVIL) 150 MG tablet, Take 150 mg by mouth at bedtime.     BIOTIN PO, Take by mouth.   bismuth subsalicylate (PEPTO BISMOL) 262 MG chewable tablet, Chew 524 mg by mouth 3 (three) times daily as needed (for stomach pain).    famotidine (PEPCID) 20 MG tablet, Take 1 tablet (20 mg total) by mouth 2 (two) times daily. **NEEDS TO BE SEEN BEFORE NEXT REFILL**   LORazepam (ATIVAN) 0.5 MG tablet, Take 0.5 mg by mouth See admin instructions. Take 0.5 mg by mouth at bedtime and 0.5 mg one to two times a day as needed for anxiety   peg 3350 powder  (MOVIPREP) 100 g SOLR, Take 1 kit (200 g total) by mouth once for 1 dose.   simethicone (MYLICON) 125 MG chewable tablet, Chew 125 mg by mouth every 6 (six) hours as needed for flatulence.    sucralfate (CARAFATE) 1 g tablet, Take 1 tablet (1 g total) by mouth 4 (four) times daily -  with meals and at bedtime.  Medical History:  Past Medical History:  Diagnosis Date   Anxiety    Arthritis    osteoarthritis- back, knees, hands   Chronic fatigue syndrome    Complication of anesthesia    had decreased temp   Depression    Diverticulosis    Eczema    GERD (gastroesophageal reflux disease)    History of hiatal hernia    IBS (irritable bowel syndrome)    Internal hemorrhoids    Seizures (HCC)    migraines   Allergies:  Allergies  Allergen Reactions   Nitrofurantoin Nausea And Vomiting   Penicillins Itching and Rash    Has patient had a PCN reaction causing immediate rash, facial/tongue/throat swelling, SOB or lightheadedness with hypotension: Yes Has patient had a PCN reaction causing severe rash involving mucus membranes or skin necrosis: Yes Has patient had a PCN reaction that required hospitalization: No Has patient had a PCN reaction occurring within the last 10 years: Unk If all of the above answers are "NO", then may proceed with Cephalosporin use.    Aspirin Nausea And Vomiting, Rash and Other (See Comments)    Stomach pain, also    Chlordiazepoxide Other (See Comments)    Flushing    Chlordiazepoxide Hcl Nausea And Vomiting and Nausea Only    Other reaction(s): GI Upset (intolerance), Other (See Comments), Unknown flushing    Demerol [Meperidine Hcl] Other (See Comments)    Burning sensation all over   Meperidine Itching and Other (See Comments)    Burning sensation all over/Body was burning up    Povidone Iodine Rash    Bumps, burning also   Povidone-Iodine Itching, Rash and Other (See Comments)    Burning and itching and bumps, burning    Vancomycin Hives    Lactose Nausea And Vomiting and Other (See Comments)    GI upset    Meperidine Hcl Other (See Comments)    Burning and tingling   Milk-Related Compounds Nausea And Vomiting   Nexium [Esomeprazole] Nausea And Vomiting   Omeprazole Nausea And Vomiting   Protonix [  Pantoprazole Sodium] Nausea And Vomiting   Amoxicillin Itching and Rash   Codeine Nausea And Vomiting   Iodinated Contrast Media Itching   Lactalbumin Nausea And Vomiting   Whey Nausea And Vomiting     Surgical History:  She  has a past surgical history that includes Abdominal hysterectomy; Bladder suspension; Cataract extraction, bilateral (Bilateral); Bunionectomy; Therapeutic abortion; Nasal reconstruction; Facelift w/blepharoplasty; Esophagogastroduodenoscopy (egd) with propofol (N/A, 01/05/2015); and Colonoscopy with propofol (N/A, 01/05/2015). Family History:  Her family history includes Alcohol abuse in her brother and sister; Asthma in her maternal grandfather; Diabetes type II in her brother; Emphysema in her mother; Heart failure in her father; Hypertension in her father.  REVIEW OF SYSTEMS  : All other systems reviewed and negative except where noted in the History of Present Illness.  PHYSICAL EXAM: BP 110/78   Pulse 82   Ht 5\' 3"  (1.6 m)   Wt 146 lb 3.2 oz (66.3 kg)   BMI 25.90 kg/m  Physical Exam   General Appearance: Well nourished, in no apparent distress. Respiratory: Respiratory effort normal, BS equal bilaterally without rales, rhonchi, wheezing. Cardio: RRR with no MRGs. Abdomen: Soft,  Obese ,active bowel sounds. No tenderness . Without guarding and Without rebound. No masses. Rectal: Not evaluated Musculoskeletal: Full ROM, Normal gait Neuro: Alert and  oriented x4;  No focal deficits. Psych:  Cooperative. Normal mood and affect.   Doree Albee, PA-C 3:25 PM

## 2023-11-14 ENCOUNTER — Telehealth: Payer: Self-pay | Admitting: Physician Assistant

## 2023-11-14 ENCOUNTER — Telehealth: Payer: Self-pay

## 2023-11-14 ENCOUNTER — Other Ambulatory Visit (HOSPITAL_COMMUNITY): Payer: Self-pay | Admitting: Cardiology

## 2023-11-14 DIAGNOSIS — R079 Chest pain, unspecified: Secondary | ICD-10-CM

## 2023-11-14 DIAGNOSIS — K219 Gastro-esophageal reflux disease without esophagitis: Secondary | ICD-10-CM

## 2023-11-14 DIAGNOSIS — R1319 Other dysphagia: Secondary | ICD-10-CM

## 2023-11-14 NOTE — Telephone Encounter (Signed)
 I called the patient to let her know that you are able to do mostly water for her colonoscopy scheduled in May but would have to use a small amount of air and she was okay with this information. She did also inform me at the time though that she just saw a cardiologist yesterday that is setting her up for a stress test because of an abnormal CRP or lab.  I do not have any records of this on epic, we will need to try to get these records, EGD: Is not scheduled for about 2 months which is actually convenient at this point. She is post be calling back with the cardiologist so we can try and get their information.

## 2023-11-14 NOTE — Telephone Encounter (Addendum)
-----   Message from Imogene Burn sent at 11/14/2023  1:56 PM EDT -----  I can use mostly water during her colonoscopy but will have to at least use some air in order to be able to see her colon mucosa effective.  Hopefully she will be okay with this. ----- Message ----- From: Doree Albee, PA-C Sent: 11/13/2023   3:37 PM EDT To: Imogene Burn, MD  Patient states she normally does not have air during a colonoscopy for inflation but water due to previous history of pain with air. Please advise Thanks, Marchelle Folks

## 2023-11-14 NOTE — Telephone Encounter (Signed)
 Called pt per provider request colon prep (moviprep) is not covered under patient's insurance. Left message advising patient that there are alternatives and she can give [place] a call back if she decides to go with one of those.

## 2023-11-14 NOTE — Progress Notes (Signed)
 I agree with the assessment and plan as outlined by Ms. Steffanie Dunn.

## 2023-11-18 NOTE — Telephone Encounter (Signed)
 Records request faxed to [facility] [place]. Sharyn Lull, MD). Phone:  [phone]  FAX:   [phone]

## 2023-11-26 NOTE — Telephone Encounter (Addendum)
 Called Dr. Annitta Jersey office to follow up on request. I was asked to refax request, Dr. Sharyn Lull will be in the office today after 1 pm. Original records request and cardiac medical clearance request faxed today.

## 2023-11-27 ENCOUNTER — Encounter (HOSPITAL_COMMUNITY): Payer: Self-pay

## 2023-11-27 ENCOUNTER — Encounter (HOSPITAL_COMMUNITY): Admission: RE | Admit: 2023-11-27 | Source: Ambulatory Visit

## 2023-12-02 NOTE — Telephone Encounter (Signed)
 Called Dr. Timoteo Force to follow up on request. I spoke with Concha Deed again and she asked that I fax again so that it will be easily available. I confirmed that I am sending to correct fax number. Clearance request faxed again.

## 2023-12-09 NOTE — Telephone Encounter (Signed)
 Received a call from Concha Deed last week informing me that patient missed her stress test appt. They will be rescheduling for patient. They will send clearance request back once patient has completed testing.

## 2023-12-24 NOTE — Telephone Encounter (Signed)
 Called Dr. Timoteo Force office to follow up. I was informed that they have not been able to reach patient to schedule her stress test. Patient has not returned their calls. Her next follow up with Dr. Glena Landau is scheduled for 02/13/24. I have left patient a message letting her know that we were informed that she has not rescheduled her stress test. I advised that it is important that she complete stress test prior to scheduled endoscopic procedures. I advised if patient is not able to complete stress test prior to appt we may need to reschedule for a later date. I have asked patient to give me a call back.

## 2023-12-26 NOTE — Addendum Note (Signed)
 Addended by: Santina Cull on: 12/26/2023 10:10 AM   Modules accepted: Orders

## 2023-12-26 NOTE — Telephone Encounter (Signed)
 Patient was scheduled with Dr. Rosaline Coma 5/27 for EGD colonoscopy for esophageal dysphagia and personal history of TA polyps. She also had elevated CRP and was scheduled for stress test however this was canceled and she is not scheduled to see Dr. Glena Landau until 02/13/2024. Please cancel EGD colonoscopy, schedule follow-up visit after cardiology appointment, please offer barium esophagram to evaluate dysphagia and look for stenosis, if it shows significant stenosis could potentially consider hospital EGD sooner. I would also offer patient's reflux Gourmet which is an aggregate that causes a layer on top of the stomach and can help with reflux symptoms, she has allergy to several PPIs.  Reflux Gourmet Rescue  It is an ALGINATE THERAPY which is the only intervention that works to safeguard the esophagus by creating a protective barrier that actually stops reflux from happening. -The general directions for use are as stated on the packaging: Take 1 teaspoon (5 ml), or more as needed or as directed by your physician, after meals and before bed. -These general directions address the most common times for reflux to occur, but our Rescue products may be taken anytime. Some individuals may take our product preemptively, when they know they will suffer from reflux, or as needed - when discomfort arises. (If taken around food, it should be consumed last.) -You do not have to take 1 teaspoon (5 ml) of the product. While one teaspoon (5ml) may be the perfect average amount to relieve reflux suffering in some, others may require more or less. You may adjust the amount of Mint Chocolate Rescue and Vanilla Caramel Rescue to the lowest amount necessary to meet your individual needs to improve your quality of life. -You may dilute the product if it is too viscous for you to consume. Keep in mind, however, that the thickness of the product was formulated to provide optimal coating and protection of your throat and esophagus.  Though diluting the product is possible, it may reduce the protective function and/or length of action. -This can be used in conjunction with reflux medications and lifestyle changes.  100% ALL-NATURAL  Paraben FREE, glycerin FREE, & potassium FREE  Made entirely from all-natural ingredients considered safe for children and during pregnancy  No known side effects  All-natural flavor Gluten FREE  Allergen FREE  Vegan  Can find more information here: NameSeizer.co.nz

## 2023-12-26 NOTE — Telephone Encounter (Signed)
 [facility] procedures cancelled. F/U scheduled with Dr. Rosaline Coma on Thursday, 03/12/24 at 10:10 am. Letter mailed to patient with changes and recommendations as she has not previously returned phone calls from our office.

## 2024-01-14 ENCOUNTER — Encounter: Admitting: Internal Medicine

## 2024-01-14 ENCOUNTER — Telehealth: Payer: Self-pay | Admitting: Physician Assistant

## 2024-01-14 NOTE — Telephone Encounter (Signed)
 Inbound call from patient requesting a call to discuss letter she recently received further. Please advise, thank you.

## 2024-01-14 NOTE — Telephone Encounter (Signed)
 Returned call to patient. Patient reports that she received the letter in the mail yesterday. Patient states that she did not proceed with the stress test because she was scared, but she did get it rescheduled. Patient would like to proceed with barium esophogram at this time. Patient knows to expect a call from radiology scheduling to set up her appt. I provided patient with their phone number as well. Patient has been directed to [facility] to get Reflux Gourmet. Patient verbalized understanding of all information and had no concerns at the end of the call.  Secure staff message sent to radiology scheduling to reach out to patient to set up appt

## 2024-01-17 NOTE — Telephone Encounter (Signed)
 Reviewed chart & pt has been scheduled for [facility] on 6/3.

## 2024-01-21 ENCOUNTER — Ambulatory Visit (HOSPITAL_COMMUNITY)

## 2024-02-14 ENCOUNTER — Encounter (HOSPITAL_BASED_OUTPATIENT_CLINIC_OR_DEPARTMENT_OTHER): Payer: Self-pay

## 2024-02-14 ENCOUNTER — Emergency Department (HOSPITAL_BASED_OUTPATIENT_CLINIC_OR_DEPARTMENT_OTHER): Admission: EM | Admit: 2024-02-14 | Discharge: 2024-02-14 | Disposition: A | Discharge status: Home / Self Care

## 2024-02-14 ENCOUNTER — Other Ambulatory Visit: Payer: Self-pay

## 2024-02-14 DIAGNOSIS — M545 Low back pain, unspecified: Secondary | ICD-10-CM | POA: Insufficient documentation

## 2024-02-14 DIAGNOSIS — R1013 Epigastric pain: Secondary | ICD-10-CM | POA: Diagnosis not present

## 2024-02-14 DIAGNOSIS — H5789 Other specified disorders of eye and adnexa: Secondary | ICD-10-CM | POA: Diagnosis present

## 2024-02-14 DIAGNOSIS — H109 Unspecified conjunctivitis: Secondary | ICD-10-CM | POA: Diagnosis not present

## 2024-02-14 HISTORY — DX: Unspecified macular degeneration: H35.30

## 2024-02-14 LAB — URINALYSIS, ROUTINE W REFLEX MICROSCOPIC
Bilirubin Urine: NEGATIVE
Glucose, UA: NEGATIVE mg/dL
Hgb urine dipstick: NEGATIVE
Ketones, ur: NEGATIVE mg/dL
Nitrite: NEGATIVE
Protein, ur: NEGATIVE mg/dL
Specific Gravity, Urine: 1.015 (ref 1.005–1.030)
pH: 7 (ref 5.0–8.0)

## 2024-02-14 LAB — COMPREHENSIVE METABOLIC PANEL WITH GFR
ALT: 13 U/L (ref 0–44)
AST: 23 U/L (ref 15–41)
Albumin: 4.1 g/dL (ref 3.5–5.0)
Alkaline Phosphatase: 71 U/L (ref 38–126)
Anion gap: 11 (ref 5–15)
BUN: 19 mg/dL (ref 8–23)
CO2: 25 mmol/L (ref 22–32)
Calcium: 9.3 mg/dL (ref 8.9–10.3)
Chloride: 105 mmol/L (ref 98–111)
Creatinine, Ser: 0.91 mg/dL (ref 0.44–1.00)
GFR, Estimated: >60 mL/min (ref >60)
Glucose, Bld: 98 mg/dL (ref 70–99)
Potassium: 4.1 mmol/L (ref 3.5–5.1)
Sodium: 140 mmol/L (ref 135–145)
Total Bilirubin: 0.2 mg/dL (ref 0.0–1.2)
Total Protein: 6.8 g/dL (ref 6.5–8.1)

## 2024-02-14 LAB — CBC WITH DIFFERENTIAL/PLATELET
Abs Immature Granulocytes: 0.02 10*3/uL (ref 0.00–0.07)
Basophils Absolute: 0 10*3/uL (ref 0.0–0.1)
Basophils Relative: 1 %
Eosinophils Absolute: 0.1 10*3/uL (ref 0.0–0.5)
Eosinophils Relative: 2 %
HCT: 35.9 % — ABNORMAL LOW (ref 36.0–46.0)
Hemoglobin: 11.9 g/dL — ABNORMAL LOW (ref 12.0–15.0)
Immature Granulocytes: 0 %
Lymphocytes Relative: 44 %
Lymphs Abs: 2.6 10*3/uL (ref 0.7–4.0)
MCH: 30.8 pg (ref 26.0–34.0)
MCHC: 33.1 g/dL (ref 30.0–36.0)
MCV: 93 fL (ref 80.0–100.0)
Monocytes Absolute: 0.5 10*3/uL (ref 0.1–1.0)
Monocytes Relative: 9 %
Neutro Abs: 2.7 10*3/uL (ref 1.7–7.7)
Neutrophils Relative %: 44 %
Platelets: 289 10*3/uL (ref 150–400)
RBC: 3.86 MIL/uL — ABNORMAL LOW (ref 3.87–5.11)
RDW: 12.8 % (ref 11.5–15.5)
WBC: 6 10*3/uL (ref 4.0–10.5)
nRBC: 0 % (ref 0.0–0.2)

## 2024-02-14 LAB — LIPASE, BLOOD: Lipase: 29 U/L (ref 11–51)

## 2024-02-14 LAB — URINALYSIS, MICROSCOPIC (REFLEX)
Bacteria, UA: NONE SEEN
RBC / HPF: NONE SEEN RBC/hpf (ref 0–5)

## 2024-02-14 MED ORDER — TETRACAINE HCL 0.5 % OP SOLN
2.0000 [drp] | Freq: Once | OPHTHALMIC | Status: AC
  Administered 2024-02-14: 2 [drp] via OPHTHALMIC
  Filled 2024-02-14: qty 4

## 2024-02-14 MED ORDER — SUCRALFATE 1 G PO TABS: 1.0000 g | ORAL_TABLET | Freq: Four times a day (QID) | ORAL | 0 refills | Status: DC

## 2024-02-14 MED ORDER — POLYMYXIN B-TRIMETHOPRIM 10000-0.1 UNIT/ML-% OP SOLN
1.0000 [drp] | OPHTHALMIC | Status: DC
  Administered 2024-02-14: 1 [drp] via OPHTHALMIC
  Filled 2024-02-14: qty 10

## 2024-02-14 MED ORDER — FLUORESCEIN SODIUM 1 MG OP STRP
1.0000 | ORAL_STRIP | Freq: Once | OPHTHALMIC | Status: AC
  Administered 2024-02-14: 1 via OPHTHALMIC
  Filled 2024-02-14: qty 1

## 2024-02-14 NOTE — Discharge Instructions (Signed)
 It is possible you have a bacterial infection in your eyes that is causing your pain and discomfort.  You have been provided an antibiotic eyedrop called Polytrim drops.  Please place 1 drop in each eye every 4 hours while awake for the next 5 days.  Your eye discomfort may also be in part due to dry eye and your macular degeneration.  Please follow-up with ophthalmology Dr. Maree listed below for further evaluation within the next week.  Your labs are reassuring today.  Your kidney, liver, and pancreas labs are normal.  Your urine did not show any signs of infection.  Given your pain increases when you eat, this could be an ulcer.  Please take your famotidine  daily as prescribed.  Please take the Carafate  prescribed.  Please follow-up with your GI doctor at your appointment in July for further evaluation of your symptoms.  Return to the emergency room for any worsening abdominal pain, persistent vomiting, fevers, worsening eye pain or vision changes, any other new or concerning symptoms

## 2024-02-14 NOTE — ED Provider Notes (Signed)
 [facility] Provider Note   CSN: [id number] Arrival date & time: 02/14/24  1306     Patient presents with: multiple complaints   Shelley [name] is a 78 y.o. female with history of macular degeneration, GERD, presents with concern for multiple complaints.  States she has had increased light sensitivity in her eyes bilaterally, new with some pain for the past 2 or 3 days.  Denies any injury to the eye.  Reports increased drainage from the eye when she wakes up in the morning.  Denies any eye itching.  Reports her eyes do feel dry and symptoms do improve with eyedrops.  Also reports some upper abdominal pain that worsens with food intake.  She thought it was her GERD, but reports symptoms not improved with the famotidine  she takes at home.  Denies any nausea, vomiting, fever, or chills.  Reports normal bowel movements.  Also reports some generalized low back pain bilaterally for the past couple weeks.  Denies any injury to this area.  Denies pain that radiates down into the legs.  Denies any dysuria, hematuria, or increased frequency.  States she has been moving a heavy door at home recently before this started.   HPI     Prior to Admission medications   Medication Sig Start Date End Date Taking? Authorizing Provider  sucralfate  (CARAFATE ) 1 g tablet Take 1 tablet (1 g total) by mouth 4 (four) times daily. 02/14/24 03/15/24 Yes Veta Palma, PA-C  acetaminophen  (TYLENOL ) 500 MG tablet Take 1,000 mg by mouth every 8 (eight) hours as needed (for headaches).     [provider]  amitriptyline  (ELAVIL ) 150 MG tablet Take 150 mg by mouth at bedtime.      [provider]  BIOTIN PO Take by mouth.    [provider]  bismuth subsalicylate (PEPTO BISMOL) 262 MG chewable tablet Chew 524 mg by mouth 3 (three) times daily as needed (for stomach pain).     [provider]  famotidine  (PEPCID ) 20 MG tablet Take 1  tablet (20 mg total) by mouth 2 (two) times daily. **NEEDS TO BE SEEN BEFORE NEXT REFILL** 06/21/23   Zollie Lowers, MD  LORazepam  (ATIVAN ) 0.5 MG tablet Take 0.5 mg by mouth See admin instructions. Take 0.5 mg by mouth at bedtime and 0.5 mg one to two times a day as needed for anxiety    [provider]  simethicone  (MYLICON) 125 MG chewable tablet Chew 125 mg by mouth every 6 (six) hours as needed for flatulence.     [provider]    Allergies: Nitrofurantoin, Penicillins, Aspirin, Chlordiazepoxide, Chlordiazepoxide hcl, Demerol [meperidine hcl], Meperidine, Povidone iodine, Povidone-iodine, Vancomycin , Lactose, Meperidine hcl, Milk-related compounds, Nexium [esomeprazole], Omeprazole, Protonix  [pantoprazole  sodium], Amoxicillin, Codeine, Iodinated contrast media, Lactalbumin, and Whey    Review of Systems  Musculoskeletal:        Lower back pain    Updated Vital Signs BP 130/73   Pulse 82   Temp (!) 97.5 F (36.4 C) (Oral)   Resp 18   Ht 5' 3 (1.6 m)   Wt 61.2 kg   SpO2 97%   BMI 23.91 kg/m   Physical Exam Vitals and nursing note reviewed.  Constitutional:      General: She is not in acute distress.    Appearance: She is well-developed.  HENT:     Head: Normocephalic and atraumatic.   Eyes:     Extraocular Movements: Extraocular movements intact.  Conjunctiva/sclera: Conjunctivae normal.     Pupils: Pupils are equal, round, and reactive to light.     Comments: No erythema or drainage from right or left eye on exam. No subconjunctivital hemorrhage  EOM intact and pain free and PERRL  Visual acuity 20/40 right eye, 20/50 left eye and both eyes.   Fluorescein  exam without any corneal abrasions or evidence of globe rupture  IOP 17 right eye, 15 left eye     Cardiovascular:     Rate and Rhythm: Normal rate and regular rhythm.     Heart sounds: No murmur heard. Pulmonary:     Effort: Pulmonary effort is normal. No respiratory distress.      Breath sounds: Normal breath sounds.  Abdominal:     Palpations: Abdomen is soft.     Tenderness: There is no abdominal tenderness.     Comments: No CVA tenderness bilaterally   Musculoskeletal:        General: No swelling. Normal range of motion.     Cervical back: Neck supple.     Comments: Able to ambulate without difficulty. No cervical, thoracic, or lumbar spinal TTP. No TTP diffusely of back musculature   Skin:    General: Skin is warm and dry.     Capillary Refill: Capillary refill takes less than 2 seconds.   Neurological:     Mental Status: She is alert.   Psychiatric:        Mood and Affect: Mood normal.     (all labs ordered are listed, but only abnormal results are displayed) Labs Reviewed  CBC WITH DIFFERENTIAL/PLATELET - Abnormal; Notable for the following components:      Result Value   RBC 3.86 (*)    Hemoglobin 11.9 (*)    HCT 35.9 (*)    All other components within normal limits  URINALYSIS, ROUTINE W REFLEX MICROSCOPIC - Abnormal; Notable for the following components:   Leukocytes,Ua TRACE (*)    All other components within normal limits  COMPREHENSIVE METABOLIC PANEL WITH GFR  LIPASE, BLOOD  URINALYSIS, MICROSCOPIC (REFLEX)    EKG: None  Radiology: No results found.   Procedures   Medications Ordered in the ED  trimethoprim-polymyxin b (POLYTRIM) ophthalmic solution 1 drop (1 drop Both Eyes Given 02/14/24 1515)  fluorescein  ophthalmic strip 1 strip (1 strip Both Eyes Given by Other 02/14/24 1330)  tetracaine  (PONTOCAINE) 0.5 % ophthalmic solution 2 drop (2 drops Both Eyes Given by Other 02/14/24 1330)                                    Medical Decision Making Amount and/or Complexity of Data Reviewed Labs: ordered.  Risk Prescription drug management.     Differential diagnosis includes but is not limited to macular degeneration, corneal abrasion, conjunctivitis, acute angle-closure glaucoma, dry eye, GERD, pancreatitis, GI ulcer,  pyelonephritis, UTI, musculoskeletal pain  ED Course:  Upon initial evaluation, patient is well-appearing, no acute distress.  Stable vitals.  Primarily concerned for the pain in her eyes bilaterally ongoing for the past 2 to 3 days.  Denies any injuries to her eyes.  On exam, no obvious erythema or edema of the eyelids, no erythema or abnormal discharge from the eye.  Visual acuity with 20/40 right eye, 20/50 left eye in both eyes.  She states normally eyes are about the same.  Pupils equal round reactive, EOM intact and pain-free.  On fluorescein  exam,  no corneal abrasion or globe rupture noted.  Intraocular pressure normal in both eyes.  No concern for emergent etiology at this time. Since she does report increased drainage from her eyes in the morning, question if this could be a bacterial conjunctivitis.  She does not wear contact lenses.  Will start her on a course of Polytrim drops.  She also reports improvement in symptoms when using regular eyedrops, suspect eye dryness could also be contributing to her eye irritation.  She does not currently have a ophthalmologist/optometrist.  Will provide contact information for ophthalmologist for follow-up.  Labs Ordered: I Ordered, and personally interpreted labs.  The pertinent results include:   CBC without leukocytosis.  Mild anemia with hemoglobin 11.9 CMP within normal limits Lipase within normal limits Urinalysis without signs of infection   Medications Given: Polytrim eyedrops  Upon re-evaluation, patient remains well-appearing with stable vitals.  Given no abdominal tenderness to palpation on exam, stable vitals, CBC, CMP, and lipase unremarkable, low concern for acute intra-abdominal pathology at this time.  She describes her abdominal pain is occurring after eating, does have a history of H. pylori.  I have concerned she might have a ulcer.  She is already on famotidine .  States she cannot take Pepcid  due to feeling sick after taking it  previously.  Will prescribe her Carafate  for additional treatment.  She has an appointment with her GI doctor at the end of July which is 1 month away.  Encouraged her to attend this appointment or see if she can get in sooner for further evaluation.  Still able to eat and drink normally and is still having normal bowel movements.  She was reporting some lower back pain, but no signs of UTI or pyelonephritis on labs.  No concern for acute intra-abdominal such as kidney stone.  She states she was moving a heavy door before this started, suspect this is musculoskeletal lower back pain.  She is neurovascular intact in the bilateral lower extremities and able to ambulate without difficulty.  Stable and appropriate for discharge home    Impression: Bilateral conjunctivitis Musculoskeletal lower back pain Abdominal pain  Disposition:  The patient was discharged home with instructions to apply 1 Polytrim drop to each eye every 4 hours while awake for the next 5 days.  Follow-up with ophthalmologist Dr. Maree within the next week for further evaluation and management of her macular degeneration.  Take her famotidine  and Carafate  as prescribed.  Follow-up with GI within the next month for further management and evaluation of her abdominal pain. Return precautions given.     This chart was dictated using voice recognition software, Dragon. Despite the best efforts of this provider to proofread and correct errors, errors may still occur which can change documentation meaning.      Final diagnoses:  Conjunctivitis, unspecified conjunctivitis type, unspecified laterality  Epigastric pain    ED Discharge Orders          Ordered    sucralfate  (CARAFATE ) 1 g tablet  4 times daily        02/14/24 1520               Veta Palma, PA-C 02/14/24 1531    Neysa Caron PARAS, DO 02/15/24 1134

## 2024-02-14 NOTE — ED Triage Notes (Signed)
 Pt presents to ED for bilateral eye pain and drainage. States that she has macular degeneration and that the drainage has been occurring x 1 year. States that she has back pain and thinks that her kidneys are irritated.

## 2024-02-19 ENCOUNTER — Telehealth: Payer: Self-pay | Admitting: Physician Assistant

## 2024-02-19 NOTE — Telephone Encounter (Signed)
 Called and spoke with patient. Patient went to ER last week for epigastric pain. Patient states that she has issues with worsening GERD and constipation. Patient feels like her gut is shutting down. I told patient that it would be best for her to have an office visit so that they can come up with a better medication regimen with her. Patient has been scheduled for a follow up with Deanna May, NP tomorrow at 1 pm.

## 2024-02-19 NOTE — Telephone Encounter (Signed)
 Patient called and stated that she would like to speak to [facility] regarding her stomach condition. Patient stated that she believe she might have to go back to the hospital again. Patient is requesting a call back. Please advise.

## 2024-02-20 ENCOUNTER — Ambulatory Visit: Admitting: Gastroenterology

## 2024-02-20 ENCOUNTER — Telehealth: Payer: Self-pay | Admitting: Gastroenterology

## 2024-02-20 NOTE — Progress Notes (Deleted)
 Chief Complaint:*** Primary GI Doctor:Dr. Federico  HPI: Shelley [name] is a 78 year old female with reflux and hiatal hernia who presents with swallowing difficulties and constipation.    Patient last seen by Alan, PA in GI office on 11/13/23. The plan during visit was to schedule EGD with possible dilatation and colonoscopy. Procedures held due to patient informing office she was seeing cardiologist for positive CRP? Pending stress test.  Went to ED for eye pain and upper abd pain. Given eye drops and sucralfate . DC home.  Interval History  Patient admits/denies GERD Patient admits/denies dysphagia Patient admits/denies nausea, vomiting, or weight loss  Patient admits/denies altered bowel habits Patient admits/denies abdominal pain Patient admits/denies rectal bleeding   Denies/Admits alcohol  Denies/Admits smoking Denies/Admits NSAID use. Denies/Admits they are on blood thinners.  Patients last colonoscopy Patients last EGD  Patient's family history includes  Wt Readings from Last 3 Encounters:  02/14/24 135 lb (61.2 kg)  11/13/23 146 lb 3.2 oz (66.3 kg)  09/13/23 147 lb (66.7 kg)      Past Medical History:  Diagnosis Date   Anxiety    Arthritis    osteoarthritis- back, knees, hands   Chronic fatigue syndrome    Complication of anesthesia    had decreased temp   Depression    Diverticulosis    Eczema    GERD (gastroesophageal reflux disease)    History of hiatal hernia    IBS (irritable bowel syndrome)    Internal hemorrhoids    Macular degeneration    Seizures (HCC)    migraines    Past Surgical History:  Procedure Laterality Date   ABDOMINAL HYSTERECTOMY     BLADDER SUSPENSION     Prolasped bladder/mesh: revision surgeon -[facility] remains with retention and constipationneeding more surgery   BUNIONECTOMY     bilateral   CATARACT EXTRACTION, BILATERAL Bilateral    COLONOSCOPY WITH PROPOFOL  N/A 01/05/2015   Procedure: COLONOSCOPY WITH PROPOFOL ;   Surgeon: Elsie Cree, MD;  Location: [facility];  Service: Endoscopy;  Laterality: N/A;   ESOPHAGOGASTRODUODENOSCOPY (EGD) WITH PROPOFOL  N/A 01/05/2015   Procedure: ESOPHAGOGASTRODUODENOSCOPY (EGD) WITH PROPOFOL ;  Surgeon: Elsie Cree, MD;  Location: [facility];  Service: Endoscopy;  Laterality: N/A;   FACELIFT W/BLEPHAROPLASTY     NASAL RECONSTRUCTION     THERAPEUTIC ABORTION     '93    Current Outpatient Medications  Medication Sig Dispense Refill   acetaminophen  (TYLENOL ) 500 MG tablet Take 1,000 mg by mouth every 8 (eight) hours as needed (for headaches).      amitriptyline  (ELAVIL ) 150 MG tablet Take 150 mg by mouth at bedtime.       BIOTIN PO Take by mouth.     bismuth subsalicylate (PEPTO BISMOL) 262 MG chewable tablet Chew 524 mg by mouth 3 (three) times daily as needed (for stomach pain).      famotidine  (PEPCID ) 20 MG tablet Take 1 tablet (20 mg total) by mouth 2 (two) times daily. **NEEDS TO BE SEEN BEFORE NEXT REFILL** 60 tablet 0   LORazepam  (ATIVAN ) 0.5 MG tablet Take 0.5 mg by mouth See admin instructions. Take 0.5 mg by mouth at bedtime and 0.5 mg one to two times a day as needed for anxiety     simethicone  (MYLICON) 125 MG chewable tablet Chew 125 mg by mouth every 6 (six) hours as needed for flatulence.      sucralfate  (CARAFATE ) 1 g tablet Take 1 tablet (1 g total) by mouth 4 (four) times daily. 120 tablet  0   No current facility-administered medications for this visit.    Allergies as of 02/20/2024 - Review Complete 02/14/2024  Allergen Reaction Noted   Nitrofurantoin Nausea And Vomiting 01/07/2012   Penicillins Itching and Rash 10/07/2009   Aspirin Nausea And Vomiting, Rash, and Other (See Comments) 10/07/2009   Chlordiazepoxide Other (See Comments) 11/14/2012   Chlordiazepoxide hcl Nausea And Vomiting and Nausea Only 11/14/2012   Demerol [meperidine hcl] Other (See Comments) 11/14/2012   Meperidine Itching and Other (See Comments) 11/14/2012   Povidone  iodine Rash 12/21/2011   Povidone-iodine Itching, Rash, and Other (See Comments) 10/07/2009   Vancomycin  Hives 08/30/2021   Lactose Nausea And Vomiting and Other (See Comments) 10/01/2017   Meperidine hcl Other (See Comments) 10/07/2009   Milk-related compounds Nausea And Vomiting 02/27/2012   Nexium [esomeprazole] Nausea And Vomiting 06/10/2018   Omeprazole Nausea And Vomiting 06/10/2018   Protonix  [pantoprazole  sodium] Nausea And Vomiting 07/04/2022   Amoxicillin Itching and Rash 10/07/2009   Codeine Nausea And Vomiting 10/12/2014   Iodinated contrast media Itching 10/12/2014   Lactalbumin Nausea And Vomiting 02/27/2012   Whey Nausea And Vomiting 10/12/2014    Family History  Problem Relation Age of Onset   Emphysema Mother        Died at 67   Heart failure Father    Hypertension Father    Alcohol  abuse Sister        x2   Diabetes type II Brother    Alcohol  abuse Brother        x3   Asthma Maternal Grandfather     Review of Systems:    Constitutional: No weight loss, fever, chills, weakness or fatigue HEENT: Eyes: No change in vision               Ears, Nose, Throat:  No change in hearing or congestion Skin: No rash or itching Cardiovascular: No chest pain, chest pressure or palpitations   Respiratory: No SOB or cough Gastrointestinal: See HPI and otherwise negative Genitourinary: No dysuria or change in urinary frequency Neurological: No headache, dizziness or syncope Musculoskeletal: No new muscle or joint pain Hematologic: No bleeding or bruising Psychiatric: No history of depression or anxiety    Physical Exam:  Vital signs: There were no vitals taken for this visit.  Constitutional:   Pleasant *** female appears to be in NAD, Well developed, Well nourished, alert and cooperative Head:  Normocephalic and atraumatic. Eyes:   PEERL, EOMI. No icterus. Conjunctiva pink. Ears:  Normal auditory acuity. Neck:  Supple Throat: Oral cavity and pharynx without  inflammation, swelling or lesion.  Respiratory: Respirations even and unlabored. Lungs clear to auscultation bilaterally.   No wheezes, crackles, or rhonchi.  Cardiovascular: Normal S1, S2. Regular rate and rhythm. No peripheral edema, cyanosis or pallor.  Gastrointestinal:  Soft, nondistended, nontender. No rebound or guarding. Normal bowel sounds. No appreciable masses or hepatomegaly. Rectal:  Not performed.  Anoscopy: Msk:  Symmetrical without gross deformities. Without edema, no deformity or joint abnormality.  Neurologic:  Alert and  oriented x4;  grossly normal neurologically.  Skin:   Dry and intact without significant lesions or rashes. Psychiatric: Oriented to person, place and time. Demonstrates good judgement and reason without abnormal affect or behaviors.  RELEVANT LABS AND IMAGING: CBC    Latest Ref Rng & Units 02/14/2024    1:46 PM 06/27/2021    3:29 PM 06/14/2018    3:46 AM  CBC  WBC 4.0 - 10.5 K/uL 6.0  7.2  6.9   Hemoglobin 12.0 - 15.0 g/dL 88.0  87.3  9.8   Hematocrit 36.0 - 46.0 % 35.9  36.2  30.2   Platelets 150 - 400 K/uL 289  326  291      CMP     Latest Ref Rng & Units 02/14/2024    1:46 PM 06/27/2021    3:29 PM 06/14/2018    3:46 AM  CMP  Glucose 70 - 99 mg/dL 98  892  93   BUN 8 - 23 mg/dL 19  17  <5   Creatinine 0.44 - 1.00 mg/dL 9.08  9.12  9.32   Sodium 135 - 145 mmol/L 140  142  141   Potassium 3.5 - 5.1 mmol/L 4.1  4.5  4.0   Chloride 98 - 111 mmol/L 105  102  110   CO2 22 - 32 mmol/L 25  25  25    Calcium 8.9 - 10.3 mg/dL 9.3  9.6  8.2   Total Protein 6.5 - 8.1 g/dL 6.8  7.3  5.1   Total Bilirubin 0.0 - 1.2 mg/dL 0.2  <9.7  0.4   Alkaline Phos 38 - 126 U/L 71  82  54   AST 15 - 41 U/L 23  18  26    ALT 0 - 44 U/L 13  15  22       Lab Results  Component Value Date   TSH 6.298 (H) 11/14/2009   02/14/24 labs show: lipase 29, normal CMP, Hgb 11.9  Assessment: 1. ***  Plan: - stress test report? -Need to reschedule colon, EGD -Patient  states she normally does not have air during a colonoscopy for inflation but water due to previous history of pain with air    Thank you for the courtesy of this consult. Please call me with any questions or concerns.   Krizia Flight, FNP-C [facility] 02/20/2024, 7:02 AM  Cc: Delores Rojelio Caldron, NP

## 2024-02-20 NOTE — Telephone Encounter (Signed)
 error

## 2024-03-12 ENCOUNTER — Ambulatory Visit: Admitting: Internal Medicine

## 2024-03-27 ENCOUNTER — Other Ambulatory Visit: Payer: Self-pay

## 2024-03-27 ENCOUNTER — Emergency Department (HOSPITAL_COMMUNITY)

## 2024-03-27 ENCOUNTER — Encounter (HOSPITAL_COMMUNITY): Payer: Self-pay

## 2024-03-27 ENCOUNTER — Emergency Department (HOSPITAL_COMMUNITY)
Admission: EM | Admit: 2024-03-27 | Discharge: 2024-03-27 | Disposition: A | Discharge status: Home / Self Care | Attending: Emergency Medicine | Admitting: Emergency Medicine

## 2024-03-27 DIAGNOSIS — H9202 Otalgia, left ear: Secondary | ICD-10-CM | POA: Diagnosis not present

## 2024-03-27 DIAGNOSIS — R519 Headache, unspecified: Secondary | ICD-10-CM | POA: Insufficient documentation

## 2024-03-27 LAB — CBC WITH DIFFERENTIAL/PLATELET
Abs Immature Granulocytes: 0.02 K/uL (ref 0.00–0.07)
Basophils Absolute: 0.1 K/uL (ref 0.0–0.1)
Basophils Relative: 1 %
Eosinophils Absolute: 0.1 K/uL (ref 0.0–0.5)
Eosinophils Relative: 1 %
HCT: 35.5 % — ABNORMAL LOW (ref 36.0–46.0)
Hemoglobin: 11.2 g/dL — ABNORMAL LOW (ref 12.0–15.0)
Immature Granulocytes: 0 %
Lymphocytes Relative: 38 %
Lymphs Abs: 3 K/uL (ref 0.7–4.0)
MCH: 30.9 pg (ref 26.0–34.0)
MCHC: 31.5 g/dL (ref 30.0–36.0)
MCV: 97.8 fL (ref 80.0–100.0)
Monocytes Absolute: 0.7 K/uL (ref 0.1–1.0)
Monocytes Relative: 9 %
Neutro Abs: 4.1 K/uL (ref 1.7–7.7)
Neutrophils Relative %: 51 %
Platelets: 366 K/uL (ref 150–400)
RBC: 3.63 MIL/uL — ABNORMAL LOW (ref 3.87–5.11)
RDW: 12.7 % (ref 11.5–15.5)
WBC: 8.1 K/uL (ref 4.0–10.5)
nRBC: 0 % (ref 0.0–0.2)

## 2024-03-27 LAB — COMPREHENSIVE METABOLIC PANEL WITH GFR
ALT: 12 U/L (ref 0–44)
AST: 20 U/L (ref 15–41)
Albumin: 3.5 g/dL (ref 3.5–5.0)
Alkaline Phosphatase: 70 U/L (ref 38–126)
Anion gap: 11 (ref 5–15)
BUN: 26 mg/dL — ABNORMAL HIGH (ref 8–23)
CO2: 22 mmol/L (ref 22–32)
Calcium: 9.5 mg/dL (ref 8.9–10.3)
Chloride: 105 mmol/L (ref 98–111)
Creatinine, Ser: 1.03 mg/dL — ABNORMAL HIGH (ref 0.44–1.00)
GFR, Estimated: 57 mL/min — ABNORMAL LOW (ref >60)
Glucose, Bld: 114 mg/dL — ABNORMAL HIGH (ref 70–99)
Potassium: 3.6 mmol/L (ref 3.5–5.1)
Sodium: 138 mmol/L (ref 135–145)
Total Bilirubin: <0.2 mg/dL (ref 0.0–1.2)
Total Protein: 6.9 g/dL (ref 6.5–8.1)

## 2024-03-27 MED ORDER — METHYLPREDNISOLONE SODIUM SUCC 125 MG IJ SOLR: 125.0000 mg | Freq: Once | INTRAMUSCULAR | Status: DC

## 2024-03-27 MED ORDER — PROCHLORPERAZINE EDISYLATE 10 MG/2ML IJ SOLN: 10.0000 mg | Freq: Once | INTRAMUSCULAR | Status: DC

## 2024-03-27 MED ORDER — DIPHENHYDRAMINE HCL 50 MG/ML IJ SOLN: 25.0000 mg | Freq: Once | INTRAMUSCULAR | Status: DC

## 2024-03-27 MED ORDER — MIDAZOLAM HCL 2 MG/2ML IJ SOLN: 2.0000 mg | Freq: Once | INTRAMUSCULAR | Status: DC
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
## 2024-03-27 NOTE — ED Provider Notes (Signed)
 Emergency Department Provider Note  TRIAGE NOTE: Patient BIBA by EMS from home with complaints of a headache that has been going on for 2 weeks. Patient states that she was hit in the head by a plastic brush that fell off her RV. Patient has had the headache since then. Patient states the pain is behind the left ear and radiates to the left shoulder. Denies nausea or LOC. Patient is A&O x 4. Ambulated to from the EMS stretcher to room 28.  HISTORY  Chief Complaint Headache   HPI Shelley [name] is a 78 y.o. female with  a chief complaint of left-sided headache and ear pain, which began approximately two months ago. Initially, the patient experienced morning headaches that resolved, but over the past two to three weeks, the pain has intensified, becoming more frequent and severe, particularly in the last week. Last night, the pain was severe enough to prevent sleep, described as a throbbing sensation and a feeling of pressure from inside the left ear. The pain occasionally radiates throughout the head. The patient denies any significant history of frequent headaches prior to this episode. There is no associated weakness, numbness, or tingling, but the patient reports burning and dry eyes. The patient has a history of macular degeneration and Helicobacter pylori infection, with recent gastrointestinal symptoms and a history of bladder surgery due to prolapse. There is a family history of cancer on the paternal side, but no immediate family history of cancer. The patient experienced a minor trauma to the head from a falling brush while cleaning an RV but denies any severe head injuries. The history was obtained from the patient.   On review of the records patient does nto appear to have had any imaging of the brain in the past. Does have multiple allergies/intolerances.   PMH Past Medical History:  Diagnosis Date   Anxiety    Arthritis    osteoarthritis- back, knees, hands   Chronic fatigue  syndrome    Complication of anesthesia    had decreased temp   Depression    Diverticulosis    Eczema    GERD (gastroesophageal reflux disease)    History of hiatal hernia    IBS (irritable bowel syndrome)    Internal hemorrhoids    Macular degeneration    Seizures (HCC)    migraines    Home Medications Prior to Admission medications   Medication Sig Start Date End Date Taking? Authorizing Provider  acetaminophen  (TYLENOL ) 500 MG tablet Take 1,000 mg by mouth every 8 (eight) hours as needed (for headaches).     [provider]  amitriptyline  (ELAVIL ) 150 MG tablet Take 150 mg by mouth at bedtime.      [provider]  BIOTIN PO Take by mouth.    [provider]  bismuth subsalicylate (PEPTO BISMOL) 262 MG chewable tablet Chew 524 mg by mouth 3 (three) times daily as needed (for stomach pain).     [provider]  famotidine  (PEPCID ) 20 MG tablet Take 1 tablet (20 mg total) by mouth 2 (two) times daily. **NEEDS TO BE SEEN BEFORE NEXT REFILL** 06/21/23   Zollie Lowers, MD  LORazepam  (ATIVAN ) 0.5 MG tablet Take 0.5 mg by mouth See admin instructions. Take 0.5 mg by mouth at bedtime and 0.5 mg one to two times a day as needed for anxiety    [provider]  simethicone  (MYLICON) 125 MG chewable tablet Chew 125 mg by mouth every 6 (six) hours as needed for  flatulence.     [provider]  sucralfate  (CARAFATE ) 1 g tablet Take 1 tablet (1 g total) by mouth 4 (four) times daily. 02/14/24 03/15/24  Veta Palma, PA-C    Social History Social History   Tobacco Use   Smoking status: Never   Smokeless tobacco: Never  Vaping Use   Vaping status: Never Used  Substance Use Topics   Alcohol  use: No   Drug use: No    Review of Systems: Documented in HPI ____________________________________________  PHYSICAL EXAM: VITAL SIGNS: Triage: Blood pressure 130/82, pulse (!) 113, temperature 98.4 F (36.9 C), temperature source Oral,  resp. rate 15, height 5' 3 (1.6 m), weight 59 kg, SpO2 100%.  Vitals:   03/27/24 0019  BP: 130/82  Pulse: (!) 113  Resp: 15  Temp: 98.4 F (36.9 C)  TempSrc: Oral  SpO2: 100%  Weight: 59 kg  Height: 5' 3 (1.6 m)    Physical Exam    ____________________________________________   LABS (all labs ordered are listed, but only abnormal results are displayed)  Labs Reviewed  CBC WITH DIFFERENTIAL/PLATELET - Abnormal; Notable for the following components:      Result Value   RBC 3.63 (*)    Hemoglobin 11.2 (*)    HCT 35.5 (*)    All other components within normal limits  COMPREHENSIVE METABOLIC PANEL WITH GFR - Abnormal; Notable for the following components:   Glucose, Bld 114 (*)    BUN 26 (*)    Creatinine, Ser 1.03 (*)    GFR, Estimated 57 (*)    All other components within normal limits   ____________________________________________  EKG   EKG Interpretation Date/Time:    Ventricular Rate:    PR Interval:    QRS Duration:    QT Interval:    QTC Calculation:   R Axis:      Text Interpretation:          ____________________________________________  RADIOLOGY  CT Head Wo Contrast Result Date: 03/27/2024 CLINICAL DATA:  Evaluate for ear/sub auricular pain. Headache, increasing frequency or severity EXAM: CT HEAD WITHOUT CONTRAST CT MAXILLOFACIAL WITHOUT CONTRAST TECHNIQUE: Multidetector CT imaging of the head and maxillofacial structures were performed using the standard protocol without intravenous contrast. Multiplanar CT image reconstructions of the maxillofacial structures were also generated. RADIATION DOSE REDUCTION: This exam was performed according to the departmental dose-optimization program which includes automated exposure control, adjustment of the mA and/or kV according to patient size and/or use of iterative reconstruction technique. COMPARISON:  None Available. FINDINGS: CT HEAD FINDINGS Brain: No evidence of acute infarction, hemorrhage,  hydrocephalus, extra-axial collection or mass lesion/mass effect. Vascular: No hyperdense vessel or unexpected calcification. Skull: Normal. Negative for fracture or focal lesion. Other: None. CT MAXILLOFACIAL FINDINGS Osseous: No fracture or mandibular dislocation. No destructive process. Orbits: Negative. No traumatic or inflammatory finding. Sinuses: Mild mucosal thickening in the left sphenoid sinus. The paranasal sinuses are otherwise well aerated. No mastoid effusion. Soft tissues: Negative. IMPRESSION: 1. No acute intracranial abnormality. 2. No acute facial bone fracture. 3. Mild mucosal thickening in the left sphenoid sinus. Electronically Signed   By: Norman Gatlin M.D.   On: 03/27/2024 01:14   CT Maxillofacial Wo Contrast Result Date: 03/27/2024 CLINICAL DATA:  Evaluate for ear/sub auricular pain. Headache, increasing frequency or severity EXAM: CT HEAD WITHOUT CONTRAST CT MAXILLOFACIAL WITHOUT CONTRAST TECHNIQUE: Multidetector CT imaging of the head and maxillofacial structures were performed using the standard protocol without intravenous contrast. Multiplanar CT image reconstructions of the  maxillofacial structures were also generated. RADIATION DOSE REDUCTION: This exam was performed according to the departmental dose-optimization program which includes automated exposure control, adjustment of the mA and/or kV according to patient size and/or use of iterative reconstruction technique. COMPARISON:  None Available. FINDINGS: CT HEAD FINDINGS Brain: No evidence of acute infarction, hemorrhage, hydrocephalus, extra-axial collection or mass lesion/mass effect. Vascular: No hyperdense vessel or unexpected calcification. Skull: Normal. Negative for fracture or focal lesion. Other: None. CT MAXILLOFACIAL FINDINGS Osseous: No fracture or mandibular dislocation. No destructive process. Orbits: Negative. No traumatic or inflammatory finding. Sinuses: Mild mucosal thickening in the left sphenoid sinus. The  paranasal sinuses are otherwise well aerated. No mastoid effusion. Soft tissues: Negative. IMPRESSION: 1. No acute intracranial abnormality. 2. No acute facial bone fracture. 3. Mild mucosal thickening in the left sphenoid sinus. Electronically Signed   By: Norman Gatlin M.D.   On: 03/27/2024 01:14   ____________________________________________  PROCEDURES  Procedure(s) performed:   Procedures ____________________________________________  INITIAL IMPRESSION / ASSESSMENT AND PLAN   Patient presents with headache primarily behind the left ear, worsening over two months, accompanied by burning eyes and a history of trauma from a brush falling on head.  Differential Diagnosis: Differential diagnosis includes but is not limited to: intracranial tumor, ear infection with possible abscess, tension headache, migraine, and sinusitis.  Perform CT scan of head and face Check blood tests Administer medicine for headache  ED Course  Images ordered viewed and obtained by myself. Agree with Radiology interpretation. Details in ED course.  Labs ordered reviewed by myself as detailed in ED course.  Consultations obtained/considered detailed in ED course.   Clinical Course as of 03/27/24 0313  Fri Mar 27, 2024  0237 Creatinine(!): 1.03 [JM]  0237 Hemoglobin(!): 11.2 [JM]  0237 CT Head Wo Contrast CT head/max-face done and showed no obvious masses (independently viewed and interpreted by myself and radiology read reviewed).  [JM]    Clinical Course User Index [JM] Shahidah Nesbitt, Selinda, MD    [facility] INDEPENDENT interpretation of the following tests(s) in the Emergency Department  Tests CONSIDERED but not performed: MRI was considered but deferred due to the patient's claustrophobia and the current focus on obtaining a CT scan first.  Specialist Consultations Obtained or Considered: Considered neurology consultation if CT scan results indicate further investigation is needed.  Care significantly  affected by the following chronic Conditions: Macular degeneration may contribute to the patient's reported burning eyes and dry eyes, which could exacerbate headache symptoms.  Care significantly affected by the following Social Determinants of Health: The patient's claustrophobia significantly impacts the ability to perform certain diagnostic imaging, such as MRI, which may be necessary for further evaluation.  Management:  Medications: Pending administration of medication for headache relief.  Potential for Clinical Deterioration: Yes, given the progressive nature of the headaches and potential for underlying serious pathology such as a tumor or abscess.  High Risk of Morbidity/Mortality Consideration: Yes, the possibility of an intracranial tumor or abscess, which could be life-threatening if not diagnosed and managed appropriately.  Disposition: Pending results of CT scan and further evaluation.  Cardiac Monitoring:  The patient was maintained on a cardiac monitor.  I personally viewed and interpreted the cardiac monitored which showed an underlying rhythm of: sinus  CRITICAL INTERVENTIONS:  N/a - refused HA meds  FINAL IMPRESSION Final diagnoses:  Acute nonintractable headache, unspecified headache type   HA improved without medication. Neurologically intact. Will d/c with neurology follow up for further workup/management.   A medical screening  exam was performed and I feel the patient has had an appropriate workup for their chief complaint at this time and likelihood of emergent condition existing is low. They have been counseled on decision, DISCHARGE, follow up and which symptoms necessitate immediate return to the emergency department. They or their family verbally stated understanding and agreement with plan and discharged in stable condition.   ____________________________________________   NEW OUTPATIENT MEDICATIONS STARTED DURING THIS VISIT:  New Prescriptions   No  medications on file    Note:  This note was prepared with assistance of Dragon voice recognition software. Occasional wrong-word or sound-a-like substitutions may have occurred due to the inherent limitations of voice recognition software.    Daequan Kozma, Selinda, MD 03/27/24 [phone]

## 2024-03-30 ENCOUNTER — Encounter: Payer: Self-pay | Admitting: Neurology

## 2024-03-31 ENCOUNTER — Encounter: Payer: Self-pay | Admitting: Neurology

## 2024-03-31 ENCOUNTER — Ambulatory Visit (INDEPENDENT_AMBULATORY_CARE_PROVIDER_SITE_OTHER): Admitting: Neurology

## 2024-03-31 ENCOUNTER — Telehealth: Payer: Self-pay | Admitting: Neurology

## 2024-03-31 VITALS — BP 116/63 | HR 79 | Ht 63.0 in | Wt 135.6 lb

## 2024-03-31 DIAGNOSIS — L989 Disorder of the skin and subcutaneous tissue, unspecified: Secondary | ICD-10-CM

## 2024-03-31 DIAGNOSIS — R51 Headache with orthostatic component, not elsewhere classified: Secondary | ICD-10-CM

## 2024-03-31 DIAGNOSIS — R519 Headache, unspecified: Secondary | ICD-10-CM | POA: Diagnosis not present

## 2024-03-31 DIAGNOSIS — H9192 Unspecified hearing loss, left ear: Secondary | ICD-10-CM

## 2024-03-31 DIAGNOSIS — H539 Unspecified visual disturbance: Secondary | ICD-10-CM

## 2024-03-31 DIAGNOSIS — H9202 Otalgia, left ear: Secondary | ICD-10-CM | POA: Diagnosis not present

## 2024-03-31 MED ORDER — ALPRAZOLAM 0.25 MG PO TABS: ORAL_TABLET | ORAL | 0 refills | Status: AC

## 2024-03-31 MED ORDER — PREDNISONE 20 MG PO TABS: 60.0000 mg | ORAL_TABLET | Freq: Every day | ORAL | 0 refills | Status: DC

## 2024-03-31 NOTE — Telephone Encounter (Signed)
 Referral for ophthalmology fax to [facility]. Phone: [phone], Fax: [phone]

## 2024-03-31 NOTE — Patient Instructions (Addendum)
 MRi brain w/wo contrast - open MRi and xanax  Ear, noses and throat referral for left ear pain and decreased hearing Ophthalmology for vision loss Dermatology: Lesion left high temoroparietal, painful, raised and rough Blood work today  3 days of steroids then may treat for headache pending results  Schedule a phone call at the end of the week to see if the steroids helped if not may consider a migraine/headache preventative while we wait for the above.   Prednisone  Tablets What is this medication? PREDNISONE  (PRED ni sone) treats many conditions such as asthma, allergic reactions, arthritis, inflammatory bowel diseases, adrenal, and blood or bone marrow disorders. It works by decreasing inflammation, slowing down an overactive immune system, or replacing cortisol normally made in the body. Cortisol is a hormone that plays an important role in how the body responds to stress, illness, and injury. It belongs to a group of medications called steroids. This medicine may be used for other purposes; ask your health care provider or pharmacist if you have questions. COMMON BRAND NAME(S): Deltasone , Predone, Sterapred, Sterapred DS What should I tell my care team before I take this medication? They need to know if you have any of these conditions: Cushing's syndrome Diabetes Glaucoma Heart disease High blood pressure Infection (especially a virus infection such as chickenpox, cold sores, or herpes) Kidney disease Liver disease Mental illness Myasthenia gravis Osteoporosis Seizures Stomach or intestine problems Thyroid disease An unusual or allergic reaction to lactose, prednisone , other medications, foods, dyes, or preservatives Pregnant or trying to get pregnant Breast-feeding How should I use this medication? Take this medication by mouth with a glass of water. Follow the directions on the prescription label. Take this medication with food. If you are taking this medication once a day,  take it in the morning. Do not take more medication than you are told to take. Do not suddenly stop taking your medication because you may develop a severe reaction. Your care team will tell you how much medication to take. If your care team wants you to stop the medication, the dose may be slowly lowered over time to avoid any side effects. Talk to your care team about the use of this medication in children. Special care may be needed. Overdosage: If you think you have taken too much of this medicine contact a poison control center or emergency room at once. NOTE: This medicine is only for you. Do not share this medicine with others. What if I miss a dose? If you miss a dose, take it as soon as you can. If it is almost time for your next dose, talk to your care team. You may need to miss a dose or take an extra dose. Do not take double or extra doses without advice. What may interact with this medication? Do not take this medication with any of the following: Metyrapone Mifepristone This medication may also interact with the following: Aminoglutethimide Amphotericin B Aspirin and aspirin-like medications Barbiturates Certain medications for diabetes, like glipizide or glyburide Cholestyramine Cholinesterase inhibitors Cyclosporine Digoxin Diuretics Ephedrine Female hormones, like estrogens and birth control pills Isoniazid Ketoconazole NSAIDS, medications for pain and inflammation, like ibuprofen  or naproxen Phenytoin Rifampin Toxoids Vaccines Warfarin This list may not describe all possible interactions. Give your health care provider a list of all the medicines, herbs, non-prescription drugs, or dietary supplements you use. Also tell them if you smoke, drink alcohol , or use illegal drugs. Some items may interact with your medicine. What should I watch for while  using this medication? Visit your care team for regular checks on your progress. If you are taking this medication over a  prolonged period, carry an identification card with your name and address, the type and dose of your medication, and your care team's name and address. This medication may increase your risk of getting an infection. Tell your care team if you are around anyone with measles or chickenpox, or if you develop sores or blisters that do not heal properly. If you are going to have surgery, tell your care team that you have taken this medication within the last twelve months. Ask your care team about your diet. You may need to lower the amount of salt you eat. This medication may increase blood sugar. Ask your care team if changes in diet or medications are needed if you have diabetes. What side effects may I notice from receiving this medication? Side effects that you should report to your care team as soon as possible: Allergic reactions--skin rash, itching, hives, swelling of the face, lips, tongue, or throat Cushing syndrome--increased fat around the midsection, upper back, neck, or face, pink or purple stretch marks on the skin, thinning, fragile skin that easily bruises, unexpected hair growth High blood sugar (hyperglycemia)--increased thirst or amount of urine, unusual weakness or fatigue, blurry vision Increase in blood pressure Infection--fever, chills, cough, sore throat, wounds that don't heal, pain or trouble when passing urine, general feeling of discomfort or being unwell Low adrenal gland function--nausea, vomiting, loss of appetite, unusual weakness or fatigue, dizziness Mood and behavior changes--anxiety, nervousness, confusion, hallucinations, irritability, hostility, thoughts of suicide or self-harm, worsening mood, feelings of depression Stomach bleeding--bloody or black, tar-like stools, vomiting blood or brown material that looks like coffee grounds Swelling of the ankles, hands, or feet Side effects that usually do not require medical attention (report to your care team if they  continue or are bothersome): Acne General discomfort and fatigue Headache Increase in appetite Nausea Trouble sleeping Weight gain This list may not describe all possible side effects. Call your doctor for medical advice about side effects. You may report side effects to FDA at 1-800-FDA-1088. Where should I keep my medication? Keep out of the reach of children. Store at room temperature between 15 and 30 degrees C (59 and 86 degrees F). Protect from light. Keep container tightly closed. Throw away any unused medication after the expiration date. NOTE: This sheet is a summary. It may not cover all possible information. If you have questions about this medicine, talk to your doctor, pharmacist, or health care provider.  2024 Elsevier/Gold Standard (2020-11-04 00:00:00)

## 2024-03-31 NOTE — Telephone Encounter (Signed)
 [facility]) calling in reference to ALPRAZolam  (XANAX ) 0.25 MG tablet confirm to make Dr. Ines aware patient also alreadyh taking  LORazepam  (ATIVAN ) 0.5 MG tablet. Would like a call back to confirm which medication would like for patient to take

## 2024-03-31 NOTE — Telephone Encounter (Signed)
 I called Chase at the pharmacy.  I relayed that Dr. Ines ok'd for pt to get xanax  for MRI and pt aware not to take lorazepam  on the day she takes the xanax .  He would make note of this on prescription.

## 2024-03-31 NOTE — Telephone Encounter (Signed)
 LMVM for pt to call me back about lorazepam  and alprazolam .

## 2024-03-31 NOTE — Telephone Encounter (Signed)
 Pt aware from Dr. Sharion instruction when in for visit.

## 2024-03-31 NOTE — Telephone Encounter (Signed)
 I called pharmacy and Cyndee said she picked up #270 tablets 90 day supply 01-17-2024. Lorazepam .

## 2024-03-31 NOTE — Telephone Encounter (Signed)
 Referral for dermatology faxed [facility]  [facility] Phone:[phone] Fax: [phone]

## 2024-03-31 NOTE — Telephone Encounter (Signed)
 Referral for ENT Faxed to  [facility]  [facility])  [facility]  [facility]) Phone-[phone] Fax-[phone]

## 2024-03-31 NOTE — Telephone Encounter (Signed)
 Gave 4 pills xanax  for MRI. She is not to take with the lorazepam  or in the same day due to risk of sedation thank you
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
## 2024-04-01 ENCOUNTER — Ambulatory Visit: Payer: Self-pay | Admitting: Neurology

## 2024-04-01 LAB — TSH RFX ON ABNORMAL TO FREE T4: TSH: 4.75 u[IU]/mL — ABNORMAL HIGH (ref 0.450–4.500)

## 2024-04-01 LAB — T4F: T4,Free (Direct): 1.02 ng/dL (ref 0.82–1.77)

## 2024-04-01 LAB — SEDIMENTATION RATE: Sed Rate: 17 mm/h (ref 0–40)

## 2024-04-01 LAB — C-REACTIVE PROTEIN: CRP: 11 mg/L — ABNORMAL HIGH (ref 0–10)

## 2024-04-02 ENCOUNTER — Telehealth: Payer: Self-pay | Admitting: Neurology

## 2024-04-02 NOTE — Telephone Encounter (Signed)
LVM for patient to call back and discuss lab results.

## 2024-04-02 NOTE — Telephone Encounter (Signed)
 Pt returned call.  I gave her the results of the labs that she had done here, labs fine, nothing concerning.  She verbalized understanding.  She has new pcp, Hoy Miu, NP at [facility].

## 2024-04-02 NOTE — Telephone Encounter (Signed)
-----   Message from Onetha KATHEE Epp sent at 04/01/2024  5:09 PM EDT ----- Labs are fine, nothing concerning thank you.  ----- Message ----- From: Rebecka Memos Lab Results In Sent: 04/01/2024   5:36 AM EDT To: Onetha KATHEE Epp, MD

## 2024-04-02 NOTE — Telephone Encounter (Signed)
 no auth required sent to [facility] for an open MRI. [phone]

## 2024-04-03 ENCOUNTER — Ambulatory Visit (INDEPENDENT_AMBULATORY_CARE_PROVIDER_SITE_OTHER): Admitting: Neurology

## 2024-04-03 DIAGNOSIS — R519 Headache, unspecified: Secondary | ICD-10-CM | POA: Diagnosis not present

## 2024-04-03 MED ORDER — PREDNISONE 20 MG PO TABS: 60.0000 mg | ORAL_TABLET | Freq: Every day | ORAL | 2 refills | Status: DC
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
## 2024-04-07 ENCOUNTER — Ambulatory Visit: Payer: Self-pay

## 2024-04-07 ENCOUNTER — Encounter (HOSPITAL_COMMUNITY): Payer: Self-pay

## 2024-04-07 ENCOUNTER — Other Ambulatory Visit: Payer: Self-pay

## 2024-04-07 ENCOUNTER — Emergency Department (HOSPITAL_COMMUNITY)
Admission: EM | Admit: 2024-04-07 | Discharge: 2024-04-08 | Disposition: A | Discharge status: Home / Self Care | Attending: Emergency Medicine | Admitting: Emergency Medicine

## 2024-04-07 DIAGNOSIS — K219 Gastro-esophageal reflux disease without esophagitis: Secondary | ICD-10-CM | POA: Insufficient documentation

## 2024-04-07 DIAGNOSIS — R519 Headache, unspecified: Secondary | ICD-10-CM | POA: Insufficient documentation

## 2024-04-07 LAB — COMPREHENSIVE METABOLIC PANEL WITH GFR
ALT: 14 U/L (ref 0–44)
AST: 19 U/L (ref 15–41)
Albumin: 3.4 g/dL — ABNORMAL LOW (ref 3.5–5.0)
Alkaline Phosphatase: 73 U/L (ref 38–126)
Anion gap: 9 (ref 5–15)
BUN: 21 mg/dL (ref 8–23)
CO2: 26 mmol/L (ref 22–32)
Calcium: 9.3 mg/dL (ref 8.9–10.3)
Chloride: 104 mmol/L (ref 98–111)
Creatinine, Ser: 0.98 mg/dL (ref 0.44–1.00)
GFR, Estimated: >60 mL/min
Glucose, Bld: 113 mg/dL — ABNORMAL HIGH (ref 70–99)
Potassium: 4.3 mmol/L (ref 3.5–5.1)
Sodium: 139 mmol/L (ref 135–145)
Total Bilirubin: 0.6 mg/dL (ref 0.0–1.2)
Total Protein: 7 g/dL (ref 6.5–8.1)

## 2024-04-07 LAB — CBC
HCT: 35.2 % — ABNORMAL LOW (ref 36.0–46.0)
Hemoglobin: 11.4 g/dL — ABNORMAL LOW (ref 12.0–15.0)
MCH: 30.8 pg (ref 26.0–34.0)
MCHC: 32.4 g/dL (ref 30.0–36.0)
MCV: 95.1 fL (ref 80.0–100.0)
Platelets: 366 K/uL (ref 150–400)
RBC: 3.7 MIL/uL — ABNORMAL LOW (ref 3.87–5.11)
RDW: 12.8 % (ref 11.5–15.5)
WBC: 8.1 K/uL (ref 4.0–10.5)
nRBC: 0 % (ref 0.0–0.2)

## 2024-04-07 LAB — URINALYSIS, ROUTINE W REFLEX MICROSCOPIC
Bilirubin Urine: NEGATIVE
Glucose, UA: NEGATIVE mg/dL
Hgb urine dipstick: NEGATIVE
Ketones, ur: NEGATIVE mg/dL
Leukocytes,Ua: NEGATIVE
Nitrite: NEGATIVE
Protein, ur: NEGATIVE mg/dL
Specific Gravity, Urine: 1.014 (ref 1.005–1.030)
pH: 7 (ref 5.0–8.0)

## 2024-04-07 LAB — LIPASE, BLOOD: Lipase: 36 U/L (ref 11–51)

## 2024-04-07 MED ORDER — PROCHLORPERAZINE EDISYLATE 10 MG/2ML IJ SOLN
10.0000 mg | Freq: Once | INTRAMUSCULAR | Status: AC
  Administered 2024-04-08: 10 mg via INTRAVENOUS
  Filled 2024-04-07: qty 2

## 2024-04-07 MED ORDER — ACETAMINOPHEN 500 MG PO TABS
1000.0000 mg | ORAL_TABLET | Freq: Once | ORAL | Status: AC
  Administered 2024-04-08: 1000 mg via ORAL
  Filled 2024-04-07: qty 2

## 2024-04-07 MED ORDER — LORAZEPAM 2 MG/ML IJ SOLN
0.5000 mg | Freq: Once | INTRAMUSCULAR | Status: DC
  Filled 2024-04-07: qty 1

## 2024-04-07 MED ORDER — DIPHENHYDRAMINE HCL 50 MG/ML IJ SOLN
12.5000 mg | Freq: Once | INTRAMUSCULAR | Status: DC
  Filled 2024-04-07: qty 1

## 2024-04-07 NOTE — ED Notes (Signed)
Pt called for triage, no response. 

## 2024-04-07 NOTE — Telephone Encounter (Signed)
 FYI Only or Action Required?: FYI only for provider.  Patient was last seen in primary care on 06/19/2022 by Zollie Lowers, MD.  Called Nurse Triage reporting Eye Pain.  Symptoms began several weeks ago.  Interventions attempted: Other: Seen at ED, PCP  and Neuro.  Symptoms are: gradually worsening.  Triage Disposition: Go to ED Now (or PCP Triage)  Patient/caregiver understands and will follow disposition?: Yes. Pt is very worried about HA, and other issues. Pt is afraid that she will not be here by the time she has the MRI. Pt will return to ED.                 Copied from CRM #[id number]. Topic: Clinical - Red Word Triage >> Apr 07, 2024  3:27 PM Shelley [name] wrote: Red Word that prompted transfer to Nurse Triage: Patient called in with complaints of severe headaches. They began on yesterday. Patient states it initially began about 2 to 3 weeks ago. Patient has an appt with Neuro on next week. She states that the headaches are located on the left side of her head radiating to her ear. Patient believes that it is stemming from the left eye as her left eye was infected with puss and appearance is red. Her vision was distorted with some pressure. She has been putting eye drops in and states that it has gotten slightly better. However, patient is seeking a specialty doctor for macular degeneration. Reason for Disposition  Patient sounds very sick or weak to the triager  Answer Assessment - Initial Assessment Questions 1. LOCATION: Where does it hurt?      Headache 2. ONSET: When did the headache start? (e.g., minutes, hours, days)      2-3 weeks 3. PATTERN: Does the pain come and go, or has it been constant since it started?     Constant - it will stop for 2 days and then it comes back 4. SEVERITY: How bad is the pain? and What does it keep you from doing?  (e.g., Scale 1-10; mild, moderate, or severe)     10/10 5. RECURRENT SYMPTOM: Have you ever had headaches  before? If Yes, ask: When was the last time? and What happened that time?      no 6. CAUSE: What do you think is causing the headache?     Unsure 7. MIGRAINE: Have you been diagnosed with migraine headaches? If Yes, ask: Is this headache similar?      no 8. HEAD INJURY: Has there been any recent injury to your head?      yes 9. OTHER SYMPTOMS: Do you have any other symptoms? (e.g., fever, stiff neck, eye pain, sore throat, cold symptoms)     Eye pain  Protocols used: Headache-A-AH

## 2024-04-07 NOTE — ED Triage Notes (Signed)
 Pt coming in via pov for abdominal pain a headache and pain in her neck. Pt reporting heartburn that is hurting her throat as well.

## 2024-04-08 ENCOUNTER — Telehealth: Payer: Self-pay | Admitting: Physician Assistant

## 2024-04-08 DIAGNOSIS — R519 Headache, unspecified: Secondary | ICD-10-CM | POA: Diagnosis not present

## 2024-04-08 NOTE — Telephone Encounter (Signed)
 Inbound call from pt stating that she has been to the ER and was told that her headaches are caused by her acid reflux. Patient is requesting to have a call back from [facility]. Please advise.

## 2024-04-08 NOTE — Discharge Instructions (Signed)
 You were seen today for multiple complaints.  Regarding your headache, you were unable to obtain an MRI.  It is importantly follow-up closely with your neurologist.  Regarding your ongoing reflux and esophagitis symptoms, follow back up with your GI doctor.

## 2024-04-08 NOTE — Telephone Encounter (Signed)
 Called and spoke with patient. Patient states that she did not do well with Reflux gourmet and noticed headache symptoms then. Patient does not think that the reflux is well controlled on Famotidine  40 mg daily. Patient reports hoarseness and feels like she still has H. Pylori. I advised patient that Alan will complete an updated evaluation and determine treatment plan with her. We were able to move patient's appt to Thursday, 04/16/24 at 8:40 am. Patient has been advised that she is more than welcome to call the office periodically to see if there have been any cancellations. Patient verbalized understanding and had no concerns at the end of the call.
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
## 2024-04-14 NOTE — Telephone Encounter (Signed)
 Received ENT office note from 04/13/24. Note placed on Dr Sharion desk for review.

## 2024-04-15 NOTE — Progress Notes (Unsigned)
 04/16/2024 Shelley [name] [id number] 07/30/1946  Referring provider: Delores Rojelio Caldron, NP Primary GI doctor: Dr. Federico  ASSESSMENT AND PLAN:   GERD with history of HH and H pylori, upper to mid esophagus, has had mostly regurgitation of pills, never liquids worsening in last several months with headache EGD 2016 + Hpylori gastritis, treated On pepcid  40 mg once daily, can not tolerate omeprazole, nexium, protonix  Chronic GERD with severe heartburn, regurgitation, and dysphagia. Symptoms unresponsive to current management. Possible hiatal hernia involvement. Adverse effects from PPIs. Acid taste and swallowing difficulty noted. Nausea and bright red rectal bleeding present. Stress test needed before EGD due to cardiac concerns. - get AB US  - diatherix H pylori in the office - Order barium swallow. - Prescribe Voquezna  samples 10 mg daily. - Discuss EGD and stress test timing with Dr. Federico, I think this is likely non cardiac however she is now being scheduled for stress test, can schedule EGD plus or minus colon after stress test. . - Provide transportation assistance contact, canceled last EGD/colon due to transportation -Will set up reminder to evaluate result from stress test and set up Egd/colon after that unless patient has worsening symptoms, suggested ER evaluation  Pericordial chest pain Saw cardiology yesterday, getting stress test We did not request cardiac clearance, patient wanted to assure she had a cardiac clearance Can have some gas, discomfort with and without exertion Will set up reminder to evaluate result from stress test and set up Egd/colon after that unless patient has worsening symptoms, suggested ER evaluation  History of TA polyps family history of advanced polyps in mom Colon 2019 3 mm TA polyp, decreased rectal tone, tics, hemorrhoids recall 5 years Due for colonoscopy, may schedule with EGD when we are able Wants moviprep , other preps make her  sick.  Patient states she normally does not have air during a colonoscopy for inflation but water due to previous history of pain with air  Constipation with history of prolapse bladder Colonoscopy with torturous colon - Increase fiber/ water intake, decrease caffeine, increase activity level. -Will add on Miralax  daily and Benefiber -possible component of pelvis floor dysfunction with history and symptoms.  -Can refer to pelvic floor PT, declines at this time -Consider anal manometry.    Patient Care Team: Chinita Hoy LITTIE DEVONNA as PCP - General (Physician Assistant) Silva Elveria JONETTA (Inactive) as Referring Physician  HISTORY OF PRESENT ILLNESS: 78 y.o. female with a past medical history listed below presents for dysphagia/GERD.    Patient last seen in the office 11/13/2023 by myself for GERD  And dysphagia.  EGD was scheduled if negative consider barium swallow to trial Carafate  consider Voquenza.  Patient is also due for follow-up colonoscopy for advanced polyps .Offered pelvic floor referral to consider on colonoscopy unremarkable.  patient canceled procedures because she wanted  Discussed the use of AI scribe software for clinical note transcription with the patient, who gave verbal consent to proceed.  History of Present Illness   Shelley [name] is a 78 year old female with gastroesophageal reflux disease who presents with worsening reflux symptoms and difficulty swallowing.  Since March, she has experienced worsening symptoms of gastroesophageal reflux disease, including severe heartburn, regurgitation, and a sensation of food getting stuck in her throat. She describes a raw and inflamed throat, and difficulty swallowing both solids and liquids. Nausea is present, but no vomiting. She reports that she has lost weight and is concerned about her nutritional intake due to difficulty  eating. She has been using Carafate  and Pepcid  without improvement, and allergies to proton pump  inhibitors complicate treatment options.  She experiences headaches that she believes may be related to her stomach issues, describing them as the worst she has had. She has a history of a hiatal hernia, which she suspects may be contributing to her symptoms. She has not yet undergone the scheduled EGD and colonoscopy due to logistical issues with arranging accompaniment.  She experiences constipation and irregular bowel movements, using Dulcolax and Miralax  intermittently. Bowel movements are sometimes runny and sometimes constipated, with bright red blood in her stool on two occasions. No black stools outside of Pepto-Bismol use.  Chest pain occurs primarily after eating and sometimes with exertion, which she associates with acid reflux. She has a history of a prolapsed bladder and is concerned about her ability to exercise due to this condition.        She  reports that she has never smoked. She has never used smokeless tobacco. She reports that she does not drink alcohol  and does not use drugs.  RELEVANT GI HISTORY, IMAGING AND LABS: Results          CBC    Component Value Date/Time   WBC 8.1 04/07/2024 2137   RBC 3.70 (L) 04/07/2024 2137   HGB 11.4 (L) 04/07/2024 2137   HGB 12.6 06/27/2021 1529   HCT 35.2 (L) 04/07/2024 2137   HCT 36.2 06/27/2021 1529   PLT 366 04/07/2024 2137   PLT 326 06/27/2021 1529   MCV 95.1 04/07/2024 2137   MCV 90 06/27/2021 1529   MCH 30.8 04/07/2024 2137   MCHC 32.4 04/07/2024 2137   RDW 12.8 04/07/2024 2137   RDW 12.4 06/27/2021 1529   LYMPHSABS 3.0 03/27/2024 0110   LYMPHSABS 2.6 06/27/2021 1529   MONOABS 0.7 03/27/2024 0110   EOSABS 0.1 03/27/2024 0110   EOSABS 0.1 06/27/2021 1529   BASOSABS 0.1 03/27/2024 0110   BASOSABS 0.0 06/27/2021 1529   Recent Labs    02/14/24 1346 03/27/24 0110 04/07/24 2137  HGB 11.9* 11.2* 11.4*    CMP     Component Value Date/Time   NA 139 04/07/2024 2137   NA 142 06/27/2021 1529   K 4.3 04/07/2024  2137   CL 104 04/07/2024 2137   CO2 26 04/07/2024 2137   GLUCOSE 113 (H) 04/07/2024 2137   BUN 21 04/07/2024 2137   BUN 17 06/27/2021 1529   CREATININE 0.98 04/07/2024 2137   CALCIUM 9.3 04/07/2024 2137   PROT 7.0 04/07/2024 2137   PROT 7.3 06/27/2021 1529   ALBUMIN 3.4 (L) 04/07/2024 2137   ALBUMIN 4.4 06/27/2021 1529   AST 19 04/07/2024 2137   ALT 14 04/07/2024 2137   ALKPHOS 73 04/07/2024 2137   BILITOT 0.6 04/07/2024 2137   BILITOT <0.2 06/27/2021 1529   GFRNONAA >60 04/07/2024 2137   GFRAA >60 06/14/2018 0346      Latest Ref Rng & Units 04/07/2024    9:37 PM 03/27/2024    1:10 AM 02/14/2024    1:46 PM  Hepatic Function  Total Protein 6.5 - 8.1 g/dL 7.0  6.9  6.8   Albumin 3.5 - 5.0 g/dL 3.4  3.5  4.1   AST 15 - 41 U/L 19  20  23    ALT 0 - 44 U/L 14  12  13    Alk Phosphatase 38 - 126 U/L 73  70  71   Total Bilirubin 0.0 - 1.2 mg/dL 0.6  <9.7  0.2       Current Medications:    Current Outpatient Medications (Cardiovascular):    nitroGLYCERIN (NITROSTAT) 0.4 MG SL tablet, Place 0.4 mg under the tongue every 5 (five) minutes as needed.   Current Outpatient Medications (Analgesics):    acetaminophen  (TYLENOL ) 500 MG tablet, Take 1,000 mg by mouth every 8 (eight) hours as needed (for headaches).  (Patient taking differently: Take 1,000 mg by mouth as needed (for headaches).)   Current Outpatient Medications (Other):    ALPRAZolam  (XANAX ) 0.25 MG tablet, Take 1-2 tabs (0.25mg -0.50mg ) 30-60 minutes before procedure. May repeat if needed.Do not drive.   amitriptyline  (ELAVIL ) 150 MG tablet, Take 150 mg by mouth at bedtime.     BIOTIN PO, Take by mouth.   bismuth subsalicylate (PEPTO BISMOL) 262 MG chewable tablet, Chew 524 mg by mouth 3 (three) times daily as needed (for stomach pain).  (Patient taking differently: Chew 524 mg by mouth as needed (for stomach pain).)   famotidine  (PEPCID ) 40 MG tablet, Take 40 mg by mouth at bedtime.   LORazepam  (ATIVAN ) 0.5 MG tablet, Take  0.5 mg by mouth See admin instructions. Take 0.5 mg by mouth at bedtime and 0.5 mg one to two times a day as needed for anxiety   polyethylene glycol (MIRALAX  / GLYCOLAX ) 17 g packet, Take 17 g by mouth as needed.   simethicone  (MYLICON) 125 MG chewable tablet, Chew 125 mg by mouth every 6 (six) hours as needed for flatulence.  (Patient taking differently: Chew 125 mg by mouth as needed for flatulence.)   Vonoprazan Fumarate  (VOQUEZNA ) 10 MG TABS, Take 10 mg by mouth daily.  Medical History:  Past Medical History:  Diagnosis Date   Anxiety    Arthritis    osteoarthritis- back, knees, hands   Chronic fatigue syndrome    Complication of anesthesia    had decreased temp   Depression    Diverticulosis    Eczema    GERD (gastroesophageal reflux disease)    History of hiatal hernia    IBS (irritable bowel syndrome)    Internal hemorrhoids    Macular degeneration    Seizures (HCC)    migraines   Allergies:  Allergies  Allergen Reactions   Nitrofurantoin Nausea And Vomiting   Penicillins Itching and Rash    Has patient had a PCN reaction causing immediate rash, facial/tongue/throat swelling, SOB or lightheadedness with hypotension: Yes Has patient had a PCN reaction causing severe rash involving mucus membranes or skin necrosis: Yes Has patient had a PCN reaction that required hospitalization: No Has patient had a PCN reaction occurring within the last 10 years: Unk If all of the above answers are NO, then may proceed with Cephalosporin use.    Aspirin Nausea And Vomiting, Rash and Other (See Comments)    Stomach pain, also    Chlordiazepoxide Other (See Comments)    Flushing    Chlordiazepoxide Hcl Nausea And Vomiting and Nausea Only    Other reaction(s): GI Upset (intolerance), Other (See Comments), Unknown flushing    Demerol [Meperidine Hcl] Other (See Comments)    Burning sensation all over   Meperidine Itching and Other (See Comments)    Burning sensation all  over/Body was burning up    Povidone Iodine Rash    Bumps, burning also   Povidone-Iodine Itching, Rash and Other (See Comments)    Burning and itching and bumps, burning    Vancomycin  Hives   Lactose Nausea And Vomiting and Other (See Comments)  GI upset    Meperidine Hcl Other (See Comments)    Burning and tingling   Milk-Related Compounds Nausea And Vomiting   Nexium [Esomeprazole] Nausea And Vomiting   Omeprazole Nausea And Vomiting   Other     Reflux Gourmet   Penicillamine    Prednisone  Other (See Comments)    Eye issues and stomach upset   Protonix  [Pantoprazole  Sodium] Nausea And Vomiting   Amoxicillin Itching and Rash   Codeine Nausea And Vomiting   Iodinated Contrast Media Itching   Lactalbumin Nausea And Vomiting   Whey Nausea And Vomiting     Surgical History:  She  has a past surgical history that includes Abdominal hysterectomy; Bladder suspension; Cataract extraction, bilateral (Bilateral); Bunionectomy; Therapeutic abortion; Nasal reconstruction; Facelift w/blepharoplasty; Esophagogastroduodenoscopy (egd) with propofol  (N/A, 01/05/2015); and Colonoscopy with propofol  (N/A, 01/05/2015). Family History:  Her family history includes Alcohol  abuse in her brother and sister; Asthma in her maternal grandfather; Diabetes type II in her brother; Emphysema in her mother; Heart failure in her father; Hypertension in her father.  REVIEW OF SYSTEMS  : All other systems reviewed and negative except where noted in the History of Present Illness.  PHYSICAL EXAM: BP 108/68 (BP Location: Left Arm, Patient Position: Sitting, Cuff Size: Normal)   Pulse 80   Ht 5' 1.5 (1.562 m) Comment: height measured without shoes  Wt 133 lb (60.3 kg)   BMI 24.72 kg/m  Physical Exam   Physical Exam   GENERAL APPEARANCE: Well nourished, in no apparent distress. HEENT: No cervical lymphadenopathy, unremarkable thyroid, sclerae anicteric, conjunctiva pink. RESPIRATORY: Respiratory effort  normal, breath sounds equal bilaterally without rales, rhonchi, or wheezing. CARDIO: Regular rate and rhythm with no murmurs, rubs, or gallops, peripheral pulses intact. ABDOMEN: Soft, non-distended, active bowel sounds in all four quadrants, no tenderness to palpation, no rebound, no mass appreciated. RECTAL: Hemorrhoidal skin tags present, no hemorrhoids, no fissures. Hard stool present, decreased tone, no masses. MUSCULOSKELETAL: Full range of motion, normal gait, without edema. SKIN: Dry, intact without rashes or lesions. No jaundice. NEURO: Alert, oriented, no focal deficits. PSYCH: Cooperative, normal mood and affect.       Alan JONELLE Coombs, PA-C 11:00 AM

## 2024-04-16 ENCOUNTER — Ambulatory Visit: Payer: Self-pay | Admitting: Physician Assistant

## 2024-04-16 ENCOUNTER — Ambulatory Visit (INDEPENDENT_AMBULATORY_CARE_PROVIDER_SITE_OTHER): Admitting: Physician Assistant

## 2024-04-16 ENCOUNTER — Encounter: Payer: Self-pay | Admitting: Physician Assistant

## 2024-04-16 ENCOUNTER — Other Ambulatory Visit (INDEPENDENT_AMBULATORY_CARE_PROVIDER_SITE_OTHER)

## 2024-04-16 ENCOUNTER — Ambulatory Visit (INDEPENDENT_AMBULATORY_CARE_PROVIDER_SITE_OTHER)

## 2024-04-16 VITALS — BP 108/68 | HR 80 | Ht 61.5 in | Wt 133.0 lb

## 2024-04-16 DIAGNOSIS — K5900 Constipation, unspecified: Secondary | ICD-10-CM | POA: Diagnosis not present

## 2024-04-16 DIAGNOSIS — K5904 Chronic idiopathic constipation: Secondary | ICD-10-CM

## 2024-04-16 DIAGNOSIS — K219 Gastro-esophageal reflux disease without esophagitis: Secondary | ICD-10-CM | POA: Diagnosis not present

## 2024-04-16 DIAGNOSIS — R072 Precordial pain: Secondary | ICD-10-CM

## 2024-04-16 DIAGNOSIS — Z8619 Personal history of other infectious and parasitic diseases: Secondary | ICD-10-CM

## 2024-04-16 DIAGNOSIS — Z860101 Personal history of adenomatous and serrated colon polyps: Secondary | ICD-10-CM

## 2024-04-16 DIAGNOSIS — D649 Anemia, unspecified: Secondary | ICD-10-CM

## 2024-04-16 DIAGNOSIS — N329 Bladder disorder, unspecified: Secondary | ICD-10-CM | POA: Diagnosis not present

## 2024-04-16 DIAGNOSIS — R1319 Other dysphagia: Secondary | ICD-10-CM

## 2024-04-16 DIAGNOSIS — Z8719 Personal history of other diseases of the digestive system: Secondary | ICD-10-CM

## 2024-04-16 DIAGNOSIS — R110 Nausea: Secondary | ICD-10-CM

## 2024-04-16 DIAGNOSIS — R1011 Right upper quadrant pain: Secondary | ICD-10-CM

## 2024-04-16 LAB — IBC + FERRITIN
Ferritin: 53.5 ng/mL (ref 10.0–291.0)
Iron: 65 ug/dL (ref 42–145)
Saturation Ratios: 22.3 % (ref 20.0–50.0)
TIBC: 291.2 ug/dL (ref 250.0–450.0)
Transferrin: 208 mg/dL — ABNORMAL LOW (ref 212.0–360.0)

## 2024-04-16 LAB — COMPREHENSIVE METABOLIC PANEL WITH GFR
ALT: 12 U/L (ref 0–35)
AST: 17 U/L (ref 0–37)
Albumin: 4 g/dL (ref 3.5–5.2)
Alkaline Phosphatase: 71 U/L (ref 39–117)
BUN: 23 mg/dL (ref 6–23)
CO2: 29 meq/L (ref 19–32)
Calcium: 9.2 mg/dL (ref 8.4–10.5)
Chloride: 102 meq/L (ref 96–112)
Creatinine, Ser: 0.92 mg/dL (ref 0.40–1.20)
GFR: 60.75 mL/min (ref >60.00)
Glucose, Bld: 92 mg/dL (ref 70–99)
Potassium: 4.1 meq/L (ref 3.5–5.1)
Sodium: 140 meq/L (ref 135–145)
Total Bilirubin: 0.3 mg/dL (ref 0.2–1.2)
Total Protein: 7.3 g/dL (ref 6.0–8.3)

## 2024-04-16 LAB — VITAMIN B12: Vitamin B-12: 609 pg/mL (ref 211–911)

## 2024-04-16 LAB — CBC WITH DIFFERENTIAL/PLATELET
Basophils Absolute: 0 K/uL (ref 0.0–0.1)
Basophils Relative: 0.6 % (ref 0.0–3.0)
Eosinophils Absolute: 0.1 K/uL (ref 0.0–0.7)
Eosinophils Relative: 1.3 % (ref 0.0–5.0)
HCT: 33.3 % — ABNORMAL LOW (ref 36.0–46.0)
Hemoglobin: 11.2 g/dL — ABNORMAL LOW (ref 12.0–15.0)
Lymphocytes Relative: 35.5 % (ref 12.0–46.0)
Lymphs Abs: 2.5 K/uL (ref 0.7–4.0)
MCHC: 33.7 g/dL (ref 30.0–36.0)
MCV: 91.8 fl (ref 78.0–100.0)
Monocytes Absolute: 0.6 K/uL (ref 0.1–1.0)
Monocytes Relative: 8.4 % (ref 3.0–12.0)
Neutro Abs: 3.9 K/uL (ref 1.4–7.7)
Neutrophils Relative %: 54.2 % (ref 43.0–77.0)
Platelets: 341 K/uL (ref 150.0–400.0)
RBC: 3.62 Mil/uL — ABNORMAL LOW (ref 3.87–5.11)
RDW: 13.6 % (ref 11.5–15.5)
WBC: 7.2 K/uL (ref 4.0–10.5)

## 2024-04-16 MED ORDER — VOQUEZNA 10 MG PO TABS: 10.0000 mg | ORAL_TABLET | Freq: Every day | ORAL | 0 refills | Status: AC

## 2024-04-16 NOTE — Patient Instructions (Addendum)
 Your provider has requested that you go to the basement level for lab work before leaving today. Press B on the elevator. The lab is located at the first door on the left as you exit the elevator.  Due to recent changes in healthcare laws, you may see the results of your imaging and laboratory studies on MyChart before your provider has had a chance to review them.  We understand that in some cases there may be results that are confusing or concerning to you. Not all laboratory results come back in the same time frame and the provider may be waiting for multiple results in order to interpret others.  Please give us  48 hours in order for your provider to thoroughly review all the results before contacting the office for clarification of your results.    You have been scheduled for an abdominal ultrasound at [facility] (1st floor of hospital) on Friday, 04/24/24 at 8:30 am. Please arrive 30 minutes prior to your appointment for registration. Make certain not to have anything to eat or drink 6 hours prior to your appointment. Should you need to reschedule your appointment, please contact radiology at [phone]. This test typically takes about 30 minutes to perform.   You have been scheduled for a Barium Esophogram at [facility] (1st floor of the hospital) on Wednesday, 05/06/24 at 11:00 am. Please arrive 30 minutes prior to your appointment for registration. Make certain not to have anything to eat or drink 3 hours prior to your test. If you need to reschedule for any reason, please contact radiology at [phone] to do so. __________________________________________________________________ A barium swallow is an examination that concentrates on views of the esophagus. This tends to be a double contrast exam (barium and two liquids which, when combined, create a gas to distend the wall of the oesophagus) or single contrast (non-ionic iodine based). The study is usually tailored to  your symptoms so a good history is essential. Attention is paid during the study to the form, structure and configuration of the esophagus, looking for functional disorders (such as aspiration, dysphagia, achalasia, motility and reflux) EXAMINATION You may be asked to change into a gown, depending on the type of swallow being performed. A radiologist and radiographer will perform the procedure. The radiologist will advise you of the type of contrast selected for your procedure and direct you during the exam. You will be asked to stand, sit or lie in several different positions and to hold a small amount of fluid in your mouth before being asked to swallow while the imaging is performed .In some instances you may be asked to swallow barium coated marshmallows to assess the motility of a solid food bolus. The exam can be recorded as a digital or video fluoroscopy procedure. POST PROCEDURE It will take 1-2 days for the barium to pass through your system. To facilitate this, it is important, unless otherwise directed, to increase your fluids for the next 24-48hrs and to resume your normal diet.  This test typically takes about 30 minutes to perform. __________________________________________________________________________________  It has been recommended to you by your physician that you have an endoscopy and colonoscopy completed.  We did not schedule the procedures today because you have to schedule an appointment with your cardiologist. Please contact our office at [phone] once you've had your appointment so we can schedule the procedures. You will be scheduled for a pre-visit and procedure at that time.   Miralax  is an osmotic laxative.  It  only brings more water into the stool.  This is safe to take daily.  Can take up to 17 gram of miralax  twice a day.  Mix with juice or coffee.  Start 1 capful at night for 3-4 days and reassess your response in 3-4 days.  You can increase and decrease the  dose based on your response.  Remember, it can take up to 3-4 days to take effect OR for the effects to wear off.   I often pair this with benefiber in the morning to help assure the stool is not too loose.  FIBER SUPPLEMENT You can do metamucil or fibercon once or twice a day but if this causes gas/bloating please switch to Benefiber or Citracel.  Fiber is good for constipation/diarrhea/irritable bowel syndrome.  It can also help with weight loss and can help lower your bad cholesterol (LDL).  Please do 1 TBSP in the morning in water, coffee, or tea.  It can take up to a month before you can see a difference with your bowel movements.  It is cheapest from [employer], [employer], [employer].    Toileting tips to help with your constipation - Drink at least 64-80 ounces of water/liquid per day. - Establish a time to try to move your bowels every day.  For many people, this is after a cup of coffee or after a meal such as breakfast. - Sit all of the way back on the toilet keeping your back fairly straight and while sitting up, try to rest the tops of your forearms on your upper thighs.   - Raising your feet with a step stool/squatty potty can be helpful to improve the angle that allows your stool to pass through the rectum. - Relax the rectum feeling it bulge toward the toilet water.  If you feel your rectum raising toward your body, you are contracting rather than relaxing. - Breathe in and slowly exhale. Belly breath by expanding your belly towards your belly button. Keep belly expanded as you gently direct pressure down and back to the anus.  A low pitched GRRR sound can assist with increasing intra-abdominal pressure.  (Can also trying to blow on a pinwheel and make it move, this helps with the same belly breathing) - Repeat 3-4 times. If unsuccessful, contract the pelvic floor to restore normal tone and get off the toilet.  Avoid excessive straining. - To reduce excessive wiping by teaching your anus  to normally contract, place hands on outer aspect of knees and resist knee movement outward.  Hold 5-10 second then place hands just inside of knees and resist inward movement of knees.  Hold 5 seconds.  Repeat a few times each way.  Go to the ER if unable to pass gas, severe AB pain, unable to hold down food, any shortness of breath of chest pain.  VISIT SUMMARY:  Today, we discussed your worsening symptoms of gastroesophageal reflux disease (GERD), including severe heartburn, regurgitation, and difficulty swallowing. We also addressed your constipation, intermittent diarrhea, rectal bleeding, weight loss, and headaches.  YOUR PLAN:  -GASTROESOPHAGEAL REFLUX DISEASE WITH DYSPHAGIA AND REGURGITATION: GERD is a condition where stomach acid frequently flows back into the tube connecting your mouth and stomach, causing irritation. We will order a barium swallow test to get a better look at your esophagus and prescribe Voquezna  samples to help manage your symptoms. We will also consider Binosto as an alternative medication. Additionally, we will discuss the timing of your EGD and stress test with Dr. Federico  and provide you with transportation assistance contact information.  -HIATAL HERNIA: A hiatal hernia occurs when the upper part of your stomach bulges through the diaphragm into your chest cavity, which can worsen GERD symptoms. We will keep monitoring this condition as it may be contributing to your reflux issues.  -CONSTIPATION WITH INTERMITTENT DIARRHEA: Constipation is when you have infrequent bowel movements or difficulty passing stools, and diarrhea is when you have loose, watery stools. We recommend continuing Miralax  daily and adding fiber to your diet. You can use Dulcolax chewables or enemas as needed.  -RECTAL BLEEDING: Rectal bleeding can be caused by various conditions, including hemorrhoids. Since you have experienced bright red blood in your stool, we will continue to monitor this  closely. Your rectal exam did not show any external hemorrhoids or fissures.  -ABNORMAL WEIGHT LOSS: Unintentional weight loss can occur due to various reasons, including difficulty eating from dysphagia and reflux. We encourage you to maintain adequate nutrition to prevent further weight loss.  -HEADACHE UNDER EVALUATION FOR GASTROINTESTINAL ETIOLOGY: Your headaches may be related to your gastrointestinal issues, such as dehydration or inflammation. We will order an H. pylori stool test and an abdominal ultrasound to investigate further.  INSTRUCTIONS:  Please schedule a barium swallow test and follow up with Dr. Federico to discuss the timing of your EGD and stress test. Continue taking Miralax  daily and add fiber to your diet. Use Dulcolax chewables or enemas as needed. Maintain adequate nutrition to prevent further weight loss. We will also conduct an H. pylori stool test and an abdominal ultrasound to investigate your headaches.   You can contact [facility] at [phone] to arrange for someone to drive you to your procedure, wait, and drive you home. The cost is approximately $100 (includes 4 hours), plus mileage. (Please note: Rates are subject to change)  You can also contact [facility] to arrange someone to drive you to your procedure, wait and drive you home. Can call [phone] or go to [email or web address]   Thank you for trusting me with your gastrointestinal care!   Alan Coombs, PA-C  _______________________________________________________  If your blood pressure at your visit was 140/90 or greater, please contact your primary care physician to follow up on this.  _______________________________________________________  If you are age 16 or older, your body mass index should be between 23-30. Your Body mass index is 24.72 kg/m. If this is out of the aforementioned range listed, please consider follow up with your Primary  Care Provider.  If you are age 38 or younger, your body mass index should be between 19-25. Your Body mass index is 24.72 kg/m. If this is out of the aformentioned range listed, please consider follow up with your Primary Care Provider.   ________________________________________________________  The [facility] providers would like to encourage you to use MYCHART to communicate with providers for non-urgent requests or questions.  Due to long hold times on the telephone, sending your provider a message by [facility] may be a faster and more efficient way to get a response.  Please allow 48 business hours for a response.  Please remember that this is for non-urgent requests.  _______________________________________________________  [facility] is using a team-based approach to care.  Your team is made up of your doctor and two to three APPS. Our APPS (Nurse Practitioners and Physician Assistants) work with your physician to ensure care continuity for you. They are fully qualified to address your health concerns and develop a treatment plan.  They communicate directly with your gastroenterologist to care for you. Seeing the Advanced Practice Practitioners on your physician's team can help you by facilitating care more promptly, often allowing for earlier appointments, access to diagnostic testing, procedures, and other specialty referrals.

## 2024-04-16 NOTE — Progress Notes (Signed)
 I agree with the assessment and plan as outlined by Ms. Craig. Agree with waiting for stress test results before proceeding with EGD/colonoscopy.

## 2024-04-17 ENCOUNTER — Telehealth: Payer: Self-pay | Admitting: Physician Assistant

## 2024-04-17 NOTE — Telephone Encounter (Signed)
 Discussed lab results & recommendations with patient. She asked if there was an update on her stress test, stated Alan was going to see if it could be done sooner. Advised her I'd discuss with Alan & be back in touch.

## 2024-04-17 NOTE — Telephone Encounter (Signed)
 Inbound call from patient stating she would like to speak to nurse in regards to her recent lab results from yesterday 8/28 Requesting a call back  Please advise  Thank you

## 2024-04-17 NOTE — Telephone Encounter (Signed)
 Discussed with Alan. Pt already has stress test set up so will need to have this completed prior to setting up procedure. Pt will need to contact cardio office if she'd like it done sooner. Pt made aware.

## 2024-04-21 ENCOUNTER — Telehealth: Payer: Self-pay | Admitting: Physician Assistant

## 2024-04-21 NOTE — Telephone Encounter (Signed)
 Called and spoke with patient regarding negative results. Pt verbalized understanding and had no concerns.

## 2024-04-24 ENCOUNTER — Ambulatory Visit (HOSPITAL_COMMUNITY)
Admission: RE | Admit: 2024-04-24 | Discharge: 2024-04-24 | Disposition: A | Discharge status: Home / Self Care | Source: Ambulatory Visit | Attending: Physician Assistant | Admitting: Physician Assistant

## 2024-04-24 DIAGNOSIS — R1319 Other dysphagia: Secondary | ICD-10-CM | POA: Diagnosis present

## 2024-04-24 DIAGNOSIS — K219 Gastro-esophageal reflux disease without esophagitis: Secondary | ICD-10-CM | POA: Insufficient documentation

## 2024-04-30 ENCOUNTER — Ambulatory Visit: Admitting: Physician Assistant

## 2024-05-06 ENCOUNTER — Ambulatory Visit (HOSPITAL_COMMUNITY)
Admission: RE | Admit: 2024-05-06 | Discharge: 2024-05-06 | Disposition: A | Discharge status: Home / Self Care | Source: Ambulatory Visit | Attending: Physician Assistant | Admitting: Physician Assistant

## 2024-05-06 DIAGNOSIS — R1319 Other dysphagia: Secondary | ICD-10-CM | POA: Insufficient documentation

## 2024-05-07 ENCOUNTER — Ambulatory Visit: Payer: Self-pay | Admitting: Physician Assistant

## 2024-05-12 ENCOUNTER — Telehealth: Payer: Self-pay

## 2024-05-12 NOTE — Telephone Encounter (Signed)
-----   Message from [facility] Syna Gad S sent at 04/16/2024  9:11 AM EDT ----- Regarding: Schedule EGD/ Colon Patient is to get cardiac workup soon.  Follow up to find out when she is scheduled and then schedule her for EGD Colon.

## 2024-05-12 NOTE — Telephone Encounter (Signed)
 Lmtcb.  (Re: has she been seen by her cardiologist or is she scheduled to see them)?

## 2024-05-13 NOTE — Telephone Encounter (Signed)
 I spoke to Shelley [name] and she advised that she was supposed to have her cardiology work up yesterday but she was not feeling well and didn't make the appointment.  She asked that I let Alan know that she figured out that she has an ulcer because she has all of the symptoms, even the cough.  She said that she would like to have the EGD done.  I told her that I would follow up in a week to see if she was able to get her appointment rescheduled.    Alan other than follow up in a week, is there anything else you would like for me to advise or do for the patient?

## 2024-05-18 ENCOUNTER — Encounter (HOSPITAL_COMMUNITY)

## 2024-05-19 ENCOUNTER — Other Ambulatory Visit (HOSPITAL_COMMUNITY): Payer: Self-pay | Admitting: Cardiology

## 2024-05-19 DIAGNOSIS — R079 Chest pain, unspecified: Secondary | ICD-10-CM

## 2024-05-19 NOTE — Telephone Encounter (Signed)
 Lmtcb

## 2024-05-20 NOTE — Telephone Encounter (Signed)
   I spoke to Shelley [name] and I advised her that Shelley [name] wanted to know if she had tried the Voquezna  samples.  She said no she never tried them because she changed her diet and eats only her meat and veggies.  And her Famotidine  40 mg at bedtime works.  She said that she cut out citrus and sugar and so far she has not had any issues.  She said that she doesn't need the Carafate  because she has some and it make her more constipated.  Her cardiology appointment is on Monday.

## 2024-05-25 ENCOUNTER — Encounter (HOSPITAL_COMMUNITY)
Admission: RE | Admit: 2024-05-25 | Discharge: 2024-05-25 | Disposition: A | Discharge status: Home / Self Care | Source: Ambulatory Visit | Attending: Cardiology | Admitting: Cardiology

## 2024-05-25 ENCOUNTER — Other Ambulatory Visit (HOSPITAL_COMMUNITY)
Admission: RE | Admit: 2024-05-25 | Discharge: 2024-05-25 | Disposition: A | Discharge status: Home / Self Care | Source: Ambulatory Visit | Attending: Cardiology | Admitting: Cardiology

## 2024-05-25 ENCOUNTER — Encounter (HOSPITAL_COMMUNITY): Payer: Self-pay

## 2024-05-25 DIAGNOSIS — R079 Chest pain, unspecified: Secondary | ICD-10-CM | POA: Insufficient documentation

## 2024-05-25 LAB — HEPATIC FUNCTION PANEL
ALT: 12 U/L (ref 0–44)
AST: 16 U/L (ref 15–41)
Albumin: 3 g/dL — ABNORMAL LOW (ref 3.5–5.0)
Alkaline Phosphatase: 72 U/L (ref 38–126)
Bilirubin, Direct: <0.1 mg/dL (ref 0.0–0.2)
Total Bilirubin: 0.4 mg/dL (ref 0.0–1.2)
Total Protein: 7 g/dL (ref 6.5–8.1)

## 2024-05-25 LAB — BASIC METABOLIC PANEL WITH GFR
Anion gap: 8 (ref 5–15)
BUN: 16 mg/dL (ref 8–23)
CO2: 27 mmol/L (ref 22–32)
Calcium: 9.1 mg/dL (ref 8.9–10.3)
Chloride: 104 mmol/L (ref 98–111)
Creatinine, Ser: 0.79 mg/dL (ref 0.44–1.00)
GFR, Estimated: >60 mL/min (ref >60)
Glucose, Bld: 107 mg/dL — ABNORMAL HIGH (ref 70–99)
Potassium: 3.9 mmol/L (ref 3.5–5.1)
Sodium: 139 mmol/L (ref 135–145)

## 2024-05-25 LAB — LIPID PANEL
Cholesterol: 161 mg/dL (ref 0–200)
HDL: 46 mg/dL (ref >40)
LDL Cholesterol: 108 mg/dL — ABNORMAL HIGH (ref 0–99)
Total CHOL/HDL Ratio: 3.5 ratio
Triglycerides: 37 mg/dL (ref <150)
VLDL: 7 mg/dL (ref 0–40)

## 2024-05-25 MED ORDER — TECHNETIUM TC 99M TETROFOSMIN IV KIT
10.7000 | PACK | Freq: Once | INTRAVENOUS | Status: AC
  Administered 2024-05-25: 10.7 via INTRAVENOUS

## 2024-05-27 ENCOUNTER — Telehealth: Payer: Self-pay | Admitting: Physician Assistant

## 2024-05-27 NOTE — Telephone Encounter (Signed)
 Inbound call from patient stating she has completed heart stress test and would like to discuss scheduling colonoscopy and endoscopy. Please advise, thank you

## 2024-05-27 NOTE — Telephone Encounter (Signed)
 Dorsey patient.

## 2024-05-28 NOTE — Telephone Encounter (Signed)
 Lm on vm for patient to return call

## 2024-06-01 NOTE — Telephone Encounter (Signed)
 Patient returning call. Wishing to speak further regarding symptoms. Please advise, thank you

## 2024-06-04 NOTE — Telephone Encounter (Signed)
 Spoke with patient & she is having ongoing constipation (BM every 3-4 days) and bloating. Feels bm's aren't complete. Poor appetite & feels weak, and concerned she's losing muscle in her legs. She's taking miralax  daily, but unable to tolerate d/t feeling nauseous. Stopped fiber supplement because it made her throw up. Will discuss rescheduling endo with Alan & give patient a call back.

## 2024-06-05 ENCOUNTER — Other Ambulatory Visit: Payer: Self-pay

## 2024-06-05 ENCOUNTER — Other Ambulatory Visit: Payer: Self-pay | Admitting: Physician Assistant

## 2024-06-05 ENCOUNTER — Encounter: Payer: Self-pay | Admitting: Pediatrics

## 2024-06-05 DIAGNOSIS — K219 Gastro-esophageal reflux disease without esophagitis: Secondary | ICD-10-CM

## 2024-06-05 DIAGNOSIS — K5900 Constipation, unspecified: Secondary | ICD-10-CM

## 2024-06-05 DIAGNOSIS — R110 Nausea: Secondary | ICD-10-CM

## 2024-06-05 DIAGNOSIS — R1319 Other dysphagia: Secondary | ICD-10-CM

## 2024-06-05 MED ORDER — MOVIPREP 100 G PO SOLR: 1.0000 | ORAL | 0 refills | Status: AC

## 2024-06-05 MED ORDER — ONDANSETRON HCL 4 MG PO TABS: ORAL_TABLET | ORAL | 0 refills | Status: AC

## 2024-06-05 NOTE — Telephone Encounter (Signed)
 Left message for patient to call back

## 2024-06-05 NOTE — Telephone Encounter (Signed)
 Discussed PA recommendations with patient. Scheduled endo colon for 06/09/24 at 2:30 pm with Dr. Suzann in the [facility] in Dr. Lafonda absence. Amb ref placed & orders sent to pharmacy. Pt will come by today to pick up prep instructions. At this time she has no further questions, advised her to call back with any questions/concerns. Pt verbalized all understanding. No blood thinners, diabetic medication/wt loss injections.

## 2024-06-07 NOTE — Progress Notes (Deleted)
  Gastroenterology History and Physical   Primary Care Physician:  Chinita Hoy LITTIE DEVONNA   Reason for Procedure:  GERD, dysphagia, nausea, vomiting, hiatal hernia, weight loss, history of H. pylori gastritis, follow-up of adenomatous colon polyps  Plan:    Upper endoscopy and colonoscopy     HPI: Shelley [name] is a 78 y.o. female undergoing upper endoscopy for investigation of GERD, dysphagia, nausea, vomiting, hiatal hernia, history of H. pylori gastritis as well as a colonoscopy for follow-up of adenomatous colon polyps.  Patient has a longstanding history of GERD with worsening symptoms since March 2025 including severe heartburn, regurgitation and sensation of food getting stuck in her throat.  Describes her throat is feeling raw and inflamed.  At the time of clinic visit reported that symptoms seem to be contributing to weight loss.  Reported to have allergy to PPI-current symptoms not well-managed with Carafate  and Pepcid .  Has a previous history of H. pylori gastritis in 2016 status posttreatment.  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  Last colonoscopy performed in 2019 disclosed a 3 mm nonadvanced tubular adenoma.  Noteworthy that colonoscopy was described as being technically complex due to tortuous colon.  Clinic notes indicates mother has had history of advanced adenomas.  Patient is followed by Dr. Federico.   Past Medical History:  Diagnosis Date   Anxiety    Arthritis    osteoarthritis- back, knees, hands   Chronic fatigue syndrome    Complication of anesthesia    had decreased temp   Depression    Diverticulosis    Eczema    GERD (gastroesophageal reflux disease)    History of hiatal hernia    IBS (irritable bowel syndrome)    Internal hemorrhoids    Macular degeneration    Seizures (HCC)    migraines    Past Surgical History:  Procedure  Laterality Date   ABDOMINAL HYSTERECTOMY     BLADDER SUSPENSION     Prolasped bladder/mesh: revision surgeon -[facility] remains with retention and constipationneeding more surgery   BUNIONECTOMY     bilateral   CATARACT EXTRACTION, BILATERAL Bilateral    COLONOSCOPY WITH PROPOFOL  N/A 01/05/2015   Procedure: COLONOSCOPY WITH PROPOFOL ;  Surgeon: Elsie Cree, MD;  Location: [facility];  Service: Endoscopy;  Laterality: N/A;   ESOPHAGOGASTRODUODENOSCOPY (EGD) WITH PROPOFOL  N/A 01/05/2015   Procedure: ESOPHAGOGASTRODUODENOSCOPY (EGD) WITH PROPOFOL ;  Surgeon: Elsie Cree, MD;  Location: [facility];  Service: Endoscopy;  Laterality: N/A;   FACELIFT W/BLEPHAROPLASTY     NASAL RECONSTRUCTION     THERAPEUTIC ABORTION     '93    Prior to Admission medications   Medication Sig Start Date End Date Taking? Authorizing Provider  acetaminophen  (TYLENOL ) 500 MG tablet Take 1,000 mg by mouth every 8 (eight) hours as needed (for headaches).  Patient taking differently: Take 1,000 mg by mouth as needed (for headaches).    [provider]  ALPRAZolam  (XANAX ) 0.25 MG tablet Take 1-2 tabs (0.25mg -0.50mg ) 30-60 minutes before procedure. May repeat if needed.Do not drive. 03/31/24   Ines Onetha NOVAK, MD  amitriptyline  (ELAVIL ) 150 MG tablet Take 150 mg by mouth at bedtime.      [provider]  BIOTIN PO Take by mouth.    [provider]  bismuth subsalicylate (PEPTO BISMOL) 262 MG chewable tablet Chew 524 mg by mouth 3 (three) times daily as needed (for stomach pain).  Patient taking  differently: Chew 524 mg by mouth as needed (for stomach pain).    [provider]  famotidine  (PEPCID ) 40 MG tablet Take 40 mg by mouth at bedtime. 02/09/24   [provider]  LORazepam  (ATIVAN ) 0.5 MG tablet Take 0.5 mg by mouth See admin instructions. Take 0.5 mg by mouth at bedtime and 0.5 mg one to two times a day as needed for anxiety    [provider]  MOVIPREP  100 g  SOLR Take 1 kit (200 g total) by mouth as directed. For colonoscopy prep 06/05/24   Craig Alan SAUNDERS, PA-C  nitroGLYCERIN (NITROSTAT) 0.4 MG SL tablet Place 0.4 mg under the tongue every 5 (five) minutes as needed. 11/14/23   [provider]  ondansetron  (ZOFRAN ) 4 MG tablet Take 1 tablet by mouth when first starting bowel prep. Then can take every 4-6 hours as needed. 06/05/24   Craig Alan SAUNDERS, PA-C  polyethylene glycol (MIRALAX  / GLYCOLAX ) 17 g packet Take 17 g by mouth as needed. 11/14/17   [provider]  simethicone  (MYLICON) 125 MG chewable tablet Chew 125 mg by mouth every 6 (six) hours as needed for flatulence.  Patient taking differently: Chew 125 mg by mouth as needed for flatulence.    [provider]  Vonoprazan Fumarate  (VOQUEZNA ) 10 MG TABS Take 10 mg by mouth daily. 04/16/24   Craig Alan SAUNDERS, PA-C    Current Outpatient Medications  Medication Sig Dispense Refill   acetaminophen  (TYLENOL ) 500 MG tablet Take 1,000 mg by mouth every 8 (eight) hours as needed (for headaches).  (Patient taking differently: Take 1,000 mg by mouth as needed (for headaches).)     ALPRAZolam  (XANAX ) 0.25 MG tablet Take 1-2 tabs (0.25mg -0.50mg ) 30-60 minutes before procedure. May repeat if needed.Do not drive. 4 tablet 0   amitriptyline  (ELAVIL ) 150 MG tablet Take 150 mg by mouth at bedtime.       BIOTIN PO Take by mouth.     bismuth subsalicylate (PEPTO BISMOL) 262 MG chewable tablet Chew 524 mg by mouth 3 (three) times daily as needed (for stomach pain).  (Patient taking differently: Chew 524 mg by mouth as needed (for stomach pain).)     famotidine  (PEPCID ) 40 MG tablet Take 40 mg by mouth at bedtime.     LORazepam  (ATIVAN ) 0.5 MG tablet Take 0.5 mg by mouth See admin instructions. Take 0.5 mg by mouth at bedtime and 0.5 mg one to two times a day as needed for anxiety     MOVIPREP  100 g SOLR Take 1 kit (200 g total) by mouth as directed. For colonoscopy prep 1 each 0    nitroGLYCERIN (NITROSTAT) 0.4 MG SL tablet Place 0.4 mg under the tongue every 5 (five) minutes as needed.     ondansetron  (ZOFRAN ) 4 MG tablet Take 1 tablet by mouth when first starting bowel prep. Then can take every 4-6 hours as needed. 20 tablet 0   polyethylene glycol (MIRALAX  / GLYCOLAX ) 17 g packet Take 17 g by mouth as needed.     simethicone  (MYLICON) 125 MG chewable tablet Chew 125 mg by mouth every 6 (six) hours as needed for flatulence.  (Patient taking differently: Chew 125 mg by mouth as needed for flatulence.)     Vonoprazan Fumarate  (VOQUEZNA ) 10 MG TABS Take 10 mg by mouth daily. 25 tablet 0   No current facility-administered medications for this visit.    Allergies as of 06/09/2024 - Review Complete 05/25/2024  Allergen Reaction Noted  Nitrofurantoin Nausea And Vomiting 01/07/2012   Penicillins Itching and Rash 10/07/2009   Aspirin Nausea And Vomiting, Rash, and Other (See Comments) 10/07/2009   Chlordiazepoxide Other (See Comments) 11/14/2012   Chlordiazepoxide hcl Nausea And Vomiting and Nausea Only 11/14/2012   Demerol [meperidine hcl] Other (See Comments) 11/14/2012   Meperidine Itching and Other (See Comments) 11/14/2012   Povidone iodine Rash 12/21/2011   Povidone-iodine Itching, Rash, and Other (See Comments) 10/07/2009   Vancomycin  Hives 08/30/2021   Lactose Nausea And Vomiting and Other (See Comments) 10/01/2017   Meperidine hcl Other (See Comments) 10/07/2009   Milk-related compounds Nausea And Vomiting 02/27/2012   Nexium [esomeprazole] Nausea And Vomiting 06/10/2018   Omeprazole Nausea And Vomiting 06/10/2018   Other  04/16/2024   Penicillamine  04/16/2024   Prednisone  Other (See Comments) 04/07/2024   Protonix  [pantoprazole  sodium] Nausea And Vomiting 07/04/2022   Amoxicillin Itching and Rash 10/07/2009   Codeine Nausea And Vomiting 10/12/2014   Iodinated contrast media Itching 10/12/2014   Lactalbumin Nausea And Vomiting 02/27/2012   Whey Nausea And  Vomiting 10/12/2014    Family History  Problem Relation Age of Onset   Emphysema Mother        Died at 76   Heart failure Father    Hypertension Father    Alcohol  abuse Sister        x2   Diabetes type II Brother    Alcohol  abuse Brother        x3   Asthma Maternal Grandfather    Headache Neg Hx    Migraines Neg Hx     Social History   Socioeconomic History   Marital status: Divorced    Spouse name: Not on file   Number of children: 2   Years of education: Not on file   Highest education level: Not on file  Occupational History   Occupation: Disabled    Employer: RETIRED  Tobacco Use   Smoking status: Never   Smokeless tobacco: Never  Vaping Use   Vaping status: Never Used  Substance and Sexual Activity   Alcohol  use: No   Drug use: No   Sexual activity: Not on file  Other Topics Concern   Not on file  Social History Narrative   Pt lives alone    Retired    Social Drivers of Corporate investment banker Strain: Low Risk  (04/01/2024)   Received from [facility]   Overall Financial Resource Strain (CARDIA)    How hard is it for you to pay for the very basics like food, housing, medical care, and heating?: Not hard at all  Food Insecurity: Low Risk  (05/07/2024)   Received from [facility]   Hunger Vital Sign    Within the past 12 months, you worried that your food would run out before you got money to buy more: Never true    Within the past 12 months, the food you bought just didn't last and you didn't have money to get more. : Never true  Transportation Needs: No Transportation Needs (05/07/2024)   Received from [facility]    In the past 12 months, has lack of reliable transportation kept you from medical appointments, meetings, work or from getting things needed for daily living? : No  Physical Activity: Not on file  Stress: Not on file  Social Connections: Unknown (12/30/2021)   Received from [facility]   Social Network     Social Network: Not on file  Intimate Partner Violence: Unknown (11/21/2021)   [facility]   HITS    Physically Hurt: Not on file    Insult or Talk Down To: Not on file    Threaten Physical Harm: Not on file    Scream or Curse: Not on file    Review of Systems:  All other review of systems negative except as mentioned in the HPI.  Physical Exam: Vital signs There were no vitals taken for this visit.  General:   Alert,  Well-developed, well-nourished, pleasant and cooperative in NAD Airway:  Mallampati  Lungs:  Clear throughout to auscultation.   Heart:  Regular rate and rhythm; no murmurs, clicks, rubs,  or gallops. Abdomen:  Soft, nontender and nondistended. Normal bowel sounds.   Neuro/Psych:  Normal mood and affect. A and O x 3  Inocente Hausen, MD [facility]astro[facility]

## 2024-06-08 ENCOUNTER — Telehealth: Payer: Self-pay

## 2024-06-08 ENCOUNTER — Other Ambulatory Visit (HOSPITAL_COMMUNITY): Payer: Self-pay

## 2024-06-08 ENCOUNTER — Telehealth: Payer: Self-pay | Admitting: Physician Assistant

## 2024-06-08 NOTE — Telephone Encounter (Signed)
 PT is returning call about moviprep . It is not available at her pharmacy or any where that she has called to find it. Her ECL is on tomorrow and she needs a new prep sent to [facility]. Please advise.

## 2024-06-08 NOTE — Telephone Encounter (Signed)
 PA request has been Submitted. New Encounter has been or will be created for follow up. For additional info see Pharmacy Prior Auth telephone encounter from 06-08-2024 Please keep in mind that test claim shows that this is a plan exclusion so very possible will get denied.

## 2024-06-08 NOTE — Telephone Encounter (Signed)
 Pharmacy Patient Advocate Encounter   Received notification from Pt Calls Messages that prior authorization for MoviPrep  100GM solution is required/requested.   Insurance verification completed.   The patient is insured through [facility].   Per test claim: PA required; PA submitted to above mentioned insurance via Latent Key/confirmation #/EOC [id number] Status is pending

## 2024-06-08 NOTE — Telephone Encounter (Signed)
 Inbound call from [facility] requesting a call at [phone] to discuss clinical questions. States questions has been sent over and a response is needed prior to October 21st. Please advise, thank you

## 2024-06-08 NOTE — Telephone Encounter (Signed)
 Pharmacy Patient Advocate Encounter  Prior Authorization form/request asks a question that requires your assistance. Please see the question below and advise accordingly. The PA will not be submitted until the necessary information is received.   Insurance requesting the following:   Alternatives provided by insurance are Clenpiq, Gavilyte-C, Gavilyte-G, or PEG-3350 with electrolytes, Gavilyte-N  or PEG-3350-sodium chloride -sodium bicarbonate-potassium chloride , Sodium sulfate-potassium sulfate-magnesium sulfate (generic Suprep), Suflave , or Sutab

## 2024-06-08 NOTE — Telephone Encounter (Signed)
 Inbound call from patient stating that she spoke to her insurance and they stated that they would pay for her Movi prep only if she can have the doctor send in a letter. Patient is requesting to have a call back. Patient is schedule to have a procedure with Dr. Suzann on the 31 st of October. Please advise.

## 2024-06-08 NOTE — Telephone Encounter (Signed)
 Please see notes below.  Pt stated that she was not able to get her Moviprep  due to the pharmacy not having it.  I offered to change the prep but the pt stated that this is the only prep  that she can tolerate.   [facility], [facility], [facility], and [facility] all were contacted trying to get the prep for the pt today with no success.   Pt procedure was rescheduled to 06/19/2024 at 7:00 AM with Dr. Suzann. Pt made aware.   Amani the pharmacist at [facility] stated that she would order the prep for the pt and it should be here tomorrow.  Hermenia Stated that she will have to pay cash price of $127.00. Pt made aware. Pt stated that her insurance wil cover it, it just needs a PA   New prep instructions were offered to pt. Pt stated that she will look at the one that she picked up and just change the date and time. Pt notified that if she has any questions to please contact our office.   PA team please assist with the PA Routed to Dr. Suzann as an FYI

## 2024-06-09 ENCOUNTER — Encounter: Admitting: Pediatrics

## 2024-06-09 NOTE — Telephone Encounter (Signed)
 Pt made aware of the PA denial. Pt stated that she spoke with the pharmacy  yesterday and they notified her of the cash price of $129. Pt stated that she will pay the cash price.  Pt verbalized understanding with all questions answered.

## 2024-06-09 NOTE — Telephone Encounter (Signed)
 Inbound call from patient states she spoke with her insurance and they advised her provider needs to send over letter explaining why patient needs movie prep, in regards to covering the cost. Please advise.   Thank you

## 2024-06-09 NOTE — Telephone Encounter (Signed)
 Left message for pt to call back

## 2024-06-09 NOTE — Telephone Encounter (Signed)
 Pharmacy Patient Advocate Encounter  Received notification from [facility] Medicare that Prior Authorization for MoviPrep  100GM solution has been DENIED.  Full denial letter will be uploaded to the media tab. See denial reason below.  Moviprep  is denied because the information p[rovided was not sufficient to support approval for medical necessity. The following required information was not provided and/or clarified:  (1) The specific medical reasons why you are unable to use five (5) of the following covered drugs:  (A) Clenpiq (B) Gavilyte-C, Gavilyte-G, or PEG-3350 with electrolytes (C) Gavilyte-N  or PEG-3350-sodium chloride -sodium bicarbonate-potassium chloride  (D) Sodium sulfate-potassium sulfate-magnesium sulfate (generic Suprep) (E) Suflave  (F) Sutab  PA #/Case ID/Reference #: [id number]

## 2024-06-09 NOTE — Telephone Encounter (Signed)
Patient returning call- please advise.  

## 2024-06-10 NOTE — Telephone Encounter (Signed)
 Left message for pt to call back

## 2024-06-11 NOTE — Telephone Encounter (Signed)
 Patient returning call.

## 2024-06-11 NOTE — Telephone Encounter (Signed)
 Spoke with patient & she is unsure of the prep she has tried before, but states Moviprep  is the only prep that she's been able to tolerate for past procedures. Insurance has denied prep & an appeal would need to be done (call - [phone] or letter faxed to [phone]). Still feeling very nauseous & unsure if she can tolerate a different prep. Will discuss with provider.

## 2024-06-11 NOTE — Telephone Encounter (Signed)
 Left message for patient to call back

## 2024-06-12 NOTE — Telephone Encounter (Signed)
 Left message for patient to call back. Good RX coupon with [facility] for moviprep  $39.

## 2024-06-15 NOTE — Telephone Encounter (Signed)
 Patient returning call. Requesting a call back. Please advise, thank you

## 2024-06-16 NOTE — Telephone Encounter (Signed)
 Left message for pt to call back

## 2024-06-16 NOTE — Telephone Encounter (Signed)
 Pt made aware of Alan Coombs PA  recommendations. Pt stated that she wanted to hold off on the Colonoscopy and precede with the EGD for now and scheduled the Colonoscopy later.  Colonoscopy  canceled.EGD  now scheduled for 06/19/2024 at 7:30 AM  Pt made aware.  Prep instructions were created and  made available for pt to pick up on the second floor. Pt made aware. Pt verbalized understanding with all questions answered.

## 2024-06-16 NOTE — Telephone Encounter (Signed)
 Pt was made aware of the recommendations about the Prep.  Pt stated that she has been having lots of nausea lately and stated that there is no way that she is going to be able to tolerate the prep. Pt is scheduled for an EGD/ Colon on 06/19/2024 at 7:00 AM with Dr. Suzann.  Pt requesting to have only EGD done doe to the fact that she will not be able to tolerate the prep.  Please review and advise

## 2024-06-17 NOTE — Progress Notes (Unsigned)
  Gastroenterology History and Physical  Primary Care Physician:  Chinita Hoy LITTIE DEVONNA   Reason for Procedure:  GERD, dysphagia, nausea, vomiting, hiatal hernia, weight loss, history of H. pylori gastritis   Plan:    Upper endoscopy     HPI: Shelley [name] is a 78 y.o. female undergoing upper endoscopy for investigation of GERD, dysphagia, nausea, vomiting, hiatal hernia, history of H. pylori gastritis  Patient has a longstanding history of GERD with worsening symptoms since March 2025 including severe heartburn, regurgitation and sensation of food getting stuck in her throat.  Describes her throat is feeling raw and inflamed.  At the time of clinic visit reported that symptoms seem to be contributing to weight loss.  Reported to have allergy to PPI-current symptoms not well-managed with Carafate  and Pepcid .  Most recently prescribed Voquezna . Has a previous history of H. pylori gastritis in 2016 status posttreatment.  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  Patient is followed by Dr. Federico.  Discussed with patient possibility of performing evaluation at that time EGD.  States that she declines dilation and does not want performed.  Reported having favorable experience with dilation.  Advised that may be significant sedation previously.  After lengthy discussion, patient is clear that she declines dilation..  There were no strictures on barium esophagram.  Past Medical History:  Diagnosis Date   Anxiety    Arthritis    osteoarthritis- back, knees, hands   Chronic fatigue syndrome    Complication of anesthesia    had decreased temp   Depression    Diverticulosis    Eczema    GERD (gastroesophageal reflux disease)    History of hiatal hernia    IBS (irritable bowel syndrome)    Internal hemorrhoids    Macular degeneration    Seizures (HCC)    migraines    Past  Surgical History:  Procedure Laterality Date   ABDOMINAL HYSTERECTOMY     BLADDER SUSPENSION     Prolasped bladder/mesh: revision surgeon -[facility] remains with retention and constipationneeding more surgery   BUNIONECTOMY     bilateral   CATARACT EXTRACTION, BILATERAL Bilateral    COLONOSCOPY WITH PROPOFOL  N/A 01/05/2015   Procedure: COLONOSCOPY WITH PROPOFOL ;  Surgeon: Elsie Cree, MD;  Location: [facility];  Service: Endoscopy;  Laterality: N/A;   ESOPHAGOGASTRODUODENOSCOPY (EGD) WITH PROPOFOL  N/A 01/05/2015   Procedure: ESOPHAGOGASTRODUODENOSCOPY (EGD) WITH PROPOFOL ;  Surgeon: Elsie Cree, MD;  Location: [facility];  Service: Endoscopy;  Laterality: N/A;   FACELIFT W/BLEPHAROPLASTY     NASAL RECONSTRUCTION     THERAPEUTIC ABORTION     '93    Prior to Admission medications   Medication Sig Start Date End Date Taking? Authorizing Provider  acetaminophen  (TYLENOL ) 500 MG tablet Take 1,000 mg by mouth every 8 (eight) hours as needed (for headaches).  Patient taking differently: Take 1,000 mg by mouth as needed (for headaches).    [provider]  ALPRAZolam  (XANAX ) 0.25 MG tablet Take 1-2 tabs (0.25mg -0.50mg ) 30-60 minutes before procedure. May repeat if needed.Do not drive. 03/31/24   Ines Onetha NOVAK, MD  amitriptyline  (ELAVIL ) 150 MG tablet Take 150 mg by mouth at bedtime.      [provider]  BIOTIN PO Take by mouth.    [provider]  bismuth subsalicylate (PEPTO BISMOL) 262 MG chewable tablet Chew 524 mg by mouth 3 (three) times daily as needed (for stomach  pain).  Patient taking differently: Chew 524 mg by mouth as needed (for stomach pain).    [provider]  famotidine  (PEPCID ) 40 MG tablet Take 40 mg by mouth at bedtime. 02/09/24   [provider]  LORazepam  (ATIVAN ) 0.5 MG tablet Take 0.5 mg by mouth See admin instructions. Take 0.5 mg by mouth at bedtime and 0.5 mg one to two times a day as needed for anxiety    [provider]  MOVIPREP  100 g SOLR Take 1 kit (200 g total) by mouth as directed. For colonoscopy prep 06/05/24   Craig Alan SAUNDERS, PA-C  nitroGLYCERIN (NITROSTAT) 0.4 MG SL tablet Place 0.4 mg under the tongue every 5 (five) minutes as needed. 11/14/23   [provider]  ondansetron  (ZOFRAN ) 4 MG tablet Take 1 tablet by mouth when first starting bowel prep. Then can take every 4-6 hours as needed. 06/05/24   Craig Alan SAUNDERS, PA-C  polyethylene glycol (MIRALAX  / GLYCOLAX ) 17 g packet Take 17 g by mouth as needed. 11/14/17   [provider]  simethicone  (MYLICON) 125 MG chewable tablet Chew 125 mg by mouth every 6 (six) hours as needed for flatulence.  Patient taking differently: Chew 125 mg by mouth as needed for flatulence.    [provider]  Vonoprazan Fumarate  (VOQUEZNA ) 10 MG TABS Take 10 mg by mouth daily. 04/16/24   Craig Alan SAUNDERS, PA-C    Current Outpatient Medications  Medication Sig Dispense Refill   amitriptyline  (ELAVIL ) 150 MG tablet Take 150 mg by mouth at bedtime.       famotidine  (PEPCID ) 40 MG tablet Take 40 mg by mouth at bedtime.     ipratropium (ATROVENT) 0.06 % nasal spray Place 2 sprays into both nostrils. As needed     LORazepam  (ATIVAN ) 0.5 MG tablet Take 0.5 mg by mouth See admin instructions. Take 0.5 mg by mouth at bedtime and 0.5 mg one to two times a day as needed for anxiety     polyethylene glycol (MIRALAX  / GLYCOLAX ) 17 g packet Take 17 g by mouth as needed.     acetaminophen  (TYLENOL ) 500 MG tablet Take 1,000 mg by mouth every 8 (eight) hours as needed (for headaches).  (Patient taking differently: Take 1,000 mg by mouth as needed (for headaches).)     ALPRAZolam  (XANAX ) 0.25 MG tablet Take 1-2 tabs (0.25mg -0.50mg ) 30-60 minutes before procedure. May repeat if needed.Do not drive. 4 tablet 0   BIOTIN PO Take by mouth. (Patient not taking: Reported on 06/19/2024)     bismuth subsalicylate (PEPTO BISMOL) 262 MG chewable tablet Chew 524  mg by mouth 3 (three) times daily as needed (for stomach pain).  (Patient taking differently: Chew 524 mg by mouth as needed (for stomach pain).)     MOVIPREP  100 g SOLR Take 1 kit (200 g total) by mouth as directed. For colonoscopy prep (Patient not taking: Reported on 06/19/2024) 1 each 0   nitroGLYCERIN (NITROSTAT) 0.4 MG SL tablet Place 0.4 mg under the tongue every 5 (five) minutes as needed.     ondansetron  (ZOFRAN ) 4 MG tablet Take 1 tablet by mouth when first starting bowel prep. Then can take every 4-6 hours as needed. 20 tablet 0   simethicone  (MYLICON) 125 MG chewable tablet Chew 125 mg by mouth every 6 (six) hours as needed for flatulence.  (Patient taking differently: Chew 125 mg by mouth as needed for flatulence.)     Vonoprazan Fumarate  (VOQUEZNA ) 10 MG TABS Take  10 mg by mouth daily. 25 tablet 0   Current Facility-Administered Medications  Medication Dose Route Frequency Provider Last Rate Last Admin   0.9 %  sodium chloride  infusion  500 mL Intravenous Once Omara Alcon M, MD        Allergies as of 06/19/2024 - Review Complete 06/19/2024  Allergen Reaction Noted   Nitrofurantoin Nausea And Vomiting 01/07/2012   Penicillins Itching and Rash 10/07/2009   Aspirin Nausea And Vomiting, Rash, and Other (See Comments) 10/07/2009   Chlordiazepoxide Other (See Comments) 11/14/2012   Chlordiazepoxide hcl Nausea And Vomiting and Nausea Only 11/14/2012   Demerol [meperidine hcl] Other (See Comments) 11/14/2012   Meperidine Itching and Other (See Comments) 11/14/2012   Povidone iodine Rash 12/21/2011   Povidone-iodine Itching, Rash, and Other (See Comments) 10/07/2009   Vancomycin  Hives 08/30/2021   Amoxicillin Itching and Rash 10/07/2009   Codeine Nausea And Vomiting 10/12/2014   Iodinated contrast media Itching 10/12/2014   Lactalbumin Nausea And Vomiting 02/27/2012   Lactose Nausea And Vomiting and Other (See Comments) 10/01/2017   Meperidine hcl Other (See Comments) 10/07/2009    Milk-related compounds Nausea And Vomiting 02/27/2012   Nexium [esomeprazole] Nausea And Vomiting 06/10/2018   Omeprazole Nausea And Vomiting 06/10/2018   Other Other (See Comments) 04/16/2024   Penicillamine Other (See Comments) 04/16/2024   Prednisone  Other (See Comments) 04/07/2024   Protonix  [pantoprazole  sodium] Nausea And Vomiting 07/04/2022   Whey Nausea And Vomiting 10/12/2014    Family History  Problem Relation Age of Onset   Emphysema Mother        Died at 29   Heart failure Father    Hypertension Father    Alcohol  abuse Sister        x2   Diabetes type II Brother    Alcohol  abuse Brother        x3   Asthma Maternal Grandfather    Headache Neg Hx    Migraines Neg Hx     Social History   Socioeconomic History   Marital status: Divorced    Spouse name: Not on file   Number of children: 2   Years of education: Not on file   Highest education level: Not on file  Occupational History   Occupation: Disabled    Employer: RETIRED  Tobacco Use   Smoking status: Never   Smokeless tobacco: Never  Vaping Use   Vaping status: Never Used  Substance and Sexual Activity   Alcohol  use: No   Drug use: No   Sexual activity: Not on file  Other Topics Concern   Not on file  Social History Narrative   Pt lives alone    Retired    Social Drivers of Corporate Investment Banker Strain: Low Risk  (04/01/2024)   Received from [facility]   Overall Financial Resource Strain (CARDIA)    How hard is it for you to pay for the very basics like food, housing, medical care, and heating?: Not hard at all  Food Insecurity: Low Risk  (05/07/2024)   Received from [facility]   Hunger Vital Sign    Within the past 12 months, you worried that your food would run out before you got money to buy more: Never true    Within the past 12 months, the food you bought just didn't last and you didn't have money to get more. : Never true  Transportation Needs: No Transportation Needs  (05/07/2024)   Received from [facility]  In the past 12 months, has lack of reliable transportation kept you from medical appointments, meetings, work or from getting things needed for daily living? : No  Physical Activity: Not on file  Stress: Not on file  Social Connections: Unknown (12/30/2021)   Received from [facility]   Social Network    Social Network: Not on file  Intimate Partner Violence: Unknown (11/21/2021)   Received from [facility]   HITS    Physically Hurt: Not on file    Insult or Talk Down To: Not on file    Threaten Physical Harm: Not on file    Scream or Curse: Not on file    Review of Systems:  All other review of systems negative except as mentioned in the HPI.  Physical Exam: Vital signs BP (!) 102/59   Pulse 89   Temp 97.6 F (36.4 C) (Temporal)   Resp 16   Ht 5' 1.5 (1.562 m)   Wt 133 lb (60.3 kg)   SpO2 96%   BMI 24.72 kg/m   General:   Alert,  Well-developed, well-nourished, pleasant and cooperative in NAD Airway:  Mallampati 1 Lungs:  Clear throughout to auscultation.   Heart:  Regular rate and rhythm; no murmurs, clicks, rubs,  or gallops. Abdomen:  Soft, nontender and nondistended. Normal bowel sounds.   Neuro/Psych:  Normal mood and affect. A and O x 3  Shelley Hausen, MD [facility]

## 2024-06-18 ENCOUNTER — Ambulatory Visit: Admitting: Neurology

## 2024-06-19 ENCOUNTER — Encounter: Payer: Self-pay | Admitting: Pediatrics

## 2024-06-19 ENCOUNTER — Ambulatory Visit: Admitting: Pediatrics

## 2024-06-19 VITALS — BP 108/60 | HR 79 | Temp 97.6°F | Resp 14 | Ht 61.5 in | Wt 133.0 lb

## 2024-06-19 DIAGNOSIS — R12 Heartburn: Secondary | ICD-10-CM

## 2024-06-19 DIAGNOSIS — K219 Gastro-esophageal reflux disease without esophagitis: Secondary | ICD-10-CM

## 2024-06-19 DIAGNOSIS — T182XXA Foreign body in stomach, initial encounter: Secondary | ICD-10-CM

## 2024-06-19 DIAGNOSIS — A048 Other specified bacterial intestinal infections: Secondary | ICD-10-CM

## 2024-06-19 DIAGNOSIS — K2950 Unspecified chronic gastritis without bleeding: Secondary | ICD-10-CM

## 2024-06-19 DIAGNOSIS — R1319 Other dysphagia: Secondary | ICD-10-CM

## 2024-06-19 DIAGNOSIS — R109 Unspecified abdominal pain: Secondary | ICD-10-CM

## 2024-06-19 DIAGNOSIS — R110 Nausea: Secondary | ICD-10-CM

## 2024-06-19 MED ORDER — SODIUM CHLORIDE 0.9 % IV SOLN: 500.0000 mL | Freq: Once | INTRAVENOUS | Status: DC

## 2024-06-19 NOTE — Progress Notes (Signed)
 Called to room to assist during endoscopic procedure.  Patient ID and intended procedure confirmed with present staff. Received instructions for my participation in the procedure from the performing physician.

## 2024-06-19 NOTE — Progress Notes (Signed)
 Vss nad trans to pacu

## 2024-06-19 NOTE — Patient Instructions (Addendum)

## 2024-06-19 NOTE — Op Note (Signed)
 [facility] Patient Name: Shelley [name] Procedure Date: 06/19/2024 7:42 AM MRN: [id number] Endoscopist: Inocente Hausen , MD, [id number] Age: 78 Referring MD:  Date of Birth: 1945-12-20 Gender: Female Account #: [id number] Procedure:                Upper GI endoscopy Indications:              Abdominal distress, , Dysphagia, Heartburn,                            Follow-up of non-erosive esophageal reflux, Failure                            to respond to medical treatment, Follow-up of                            Helicobacter pylori, Regurgitation, Barium                            esophagram with 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                            Patient declines having dilation performed today. Medicines:                Monitored Anesthesia Care Procedure:                Pre-Anesthesia Assessment:                           - Prior to the procedure, a History and Physical                            was performed, and patient medications and                            allergies were reviewed. The patient's tolerance of                            previous anesthesia was also reviewed. The risks                            and benefits of the procedure and the sedation                            options and risks were discussed with the patient.                            All questions were answered, and informed consent                            was obtained. Prior Anticoagulants: The patient has  taken no anticoagulant or antiplatelet agents. ASA                            Grade Assessment: II - A patient with mild systemic                            disease. After reviewing the risks and benefits,                            the patient was deemed in satisfactory condition to                            undergo the procedure.                           After  obtaining informed consent, the endoscope was                            passed under direct vision. Throughout the                            procedure, the patient's blood pressure, pulse, and                            oxygen saturations were monitored continuously. The                            Olympus Scope [id number] was introduced through the                            mouth, and advanced to the second part of duodenum.                            The upper GI endoscopy was accomplished without                            difficulty. The patient tolerated the procedure                            well. Scope In: Scope Out: Findings:                 No endoscopic abnormality was evident in the                            esophagus to explain the patient's complaint of                            dysphagia. No esophageal webs, rings or strictures                            seen. Biopsies were obtained from the proximal and                            distal  esophagus with cold forceps for histology                            for evaluation of eosinophilic esophagitis.                           A small amount of food (residue) was found in the                            gastric body and in the gastric antrum.                           Normal mucosa was found in the gastric body, in the                            gastric antrum, in the cardia (on retroflexion) and                            in the gastric fundus (on retroflexion). Biopsies                            were taken with a cold forceps for Helicobacter                            pylori testing.                           The duodenal bulb and second portion of the                            duodenum were normal. Biopsies for histology were                            taken with a cold forceps for evaluation of celiac                            disease. Complications:            No immediate complications. Estimated blood loss:                             Minimal. Estimated Blood Loss:     Estimated blood loss was minimal. Impression:               - No endoscopic esophageal abnormality to explain                            patient's dysphagia.                           - A small amount of food (residue) in the stomach.                           - Normal mucosa was found in the gastric body, in  the antrum, in the cardia and in the gastric                            fundus. Biopsied.                           - Normal duodenal bulb and second portion of the                            duodenum. Biopsied.                           - Biopsies were taken with a cold forceps for                            evaluation of eosinophilic esophagitis. Recommendation:           - Discharge patient to home (ambulatory).                           - Await pathology results.                           - If biopsies are unrevealing consider ENT                            evaluation for cricopharyngeal bar potentially                            contributing to dysphagia in conjunction with                            esophageal dysmotility                           - Consider possible gastric emptying scan given                            presence of a small amount of retained gastric                            contents at the time of EGD to evaluate for delayed                            gastric emptying contributing to symptoms.                           - The findings and recommendations were discussed                            with the patient's family.                           - Patient has a contact number available for  emergencies. The signs and symptoms of potential                            delayed complications were discussed with the                            patient. Return to normal activities tomorrow.                            Written discharge instructions were  provided to the                            patient. Inocente Hausen, MD 06/19/2024 8:18:56 AM This report has been signed electronically.

## 2024-06-22 ENCOUNTER — Telehealth: Payer: Self-pay | Admitting: Lactation Services

## 2024-06-22 NOTE — Telephone Encounter (Signed)
  Follow up Call-     06/19/2024    7:19 AM  Call back number  Post procedure Call Back phone  # [phone]  Permission to leave phone message Yes     Patient questions:  Do you have a fever, pain , or abdominal swelling? No. Pain Score  0 *  Have you tolerated food without any problems? Yes.    Have you been able to return to your normal activities? Yes.    Do you have any questions about your discharge instructions: Diet   No. Medications  No. Follow up visit  No.  Do you have questions or concerns about your Care? No.  Actions: * If pain score is 4 or above: No action needed, pain <4.

## 2024-06-24 ENCOUNTER — Ambulatory Visit: Payer: Self-pay | Admitting: Pediatrics

## 2024-06-24 LAB — SURGICAL PATHOLOGY

## 2024-06-28 ENCOUNTER — Ambulatory Visit (HOSPITAL_COMMUNITY): Admission: RE | Admit: 2024-06-28 | Source: Ambulatory Visit

## 2024-08-14 ENCOUNTER — Telehealth: Payer: Self-pay | Admitting: Pediatrics

## 2024-08-14 NOTE — Telephone Encounter (Signed)
 PT is calling to speak with a nurse regarding her low hemoglobin levels. Please advise.

## 2024-08-17 NOTE — Telephone Encounter (Signed)
"  Left message for pt to call back   "

## 2024-08-21 NOTE — Telephone Encounter (Signed)
"  Left message for pt to call back   "

## 2024-08-23 ENCOUNTER — Emergency Department (HOSPITAL_COMMUNITY)
Admission: EM | Admit: 2024-08-23 | Discharge: 2024-08-24 | Disposition: A | Attending: Emergency Medicine | Admitting: Emergency Medicine

## 2024-08-23 DIAGNOSIS — D649 Anemia, unspecified: Secondary | ICD-10-CM | POA: Diagnosis not present

## 2024-08-23 DIAGNOSIS — R531 Weakness: Secondary | ICD-10-CM | POA: Insufficient documentation

## 2024-08-23 DIAGNOSIS — M791 Myalgia, unspecified site: Secondary | ICD-10-CM | POA: Diagnosis present

## 2024-08-23 LAB — CBC
HCT: 29.7 % — ABNORMAL LOW (ref 36.0–46.0)
Hemoglobin: 8.9 g/dL — ABNORMAL LOW (ref 12.0–15.0)
MCH: 26.2 pg (ref 26.0–34.0)
MCHC: 30 g/dL (ref 30.0–36.0)
MCV: 87.4 fL (ref 80.0–100.0)
Platelets: 552 K/uL — ABNORMAL HIGH (ref 150–400)
RBC: 3.4 MIL/uL — ABNORMAL LOW (ref 3.87–5.11)
RDW: 15.2 % (ref 11.5–15.5)
WBC: 8.5 K/uL (ref 4.0–10.5)
nRBC: 0 % (ref 0.0–0.2)

## 2024-08-23 LAB — TYPE AND SCREEN
ABO/RH(D): B NEG
Antibody Screen: NEGATIVE

## 2024-08-23 LAB — BASIC METABOLIC PANEL WITH GFR
Anion gap: 10 (ref 5–15)
BUN: 16 mg/dL (ref 8–23)
CO2: 28 mmol/L (ref 22–32)
Calcium: 9.4 mg/dL (ref 8.9–10.3)
Chloride: 99 mmol/L (ref 98–111)
Creatinine, Ser: 0.77 mg/dL (ref 0.44–1.00)
GFR, Estimated: >60 mL/min
Glucose, Bld: 117 mg/dL — ABNORMAL HIGH (ref 70–99)
Potassium: 4.5 mmol/L (ref 3.5–5.1)
Sodium: 136 mmol/L (ref 135–145)

## 2024-08-23 LAB — ABO/RH: ABO/RH(D): B NEG

## 2024-08-23 NOTE — ED Notes (Signed)
Pt says she is unable to provide a urine sample at this time.

## 2024-08-23 NOTE — ED Triage Notes (Signed)
 Pt here from home with c/o weakness that has been ongoing for a couple of months , pt states she has a hard time chewing and hx of anemia

## 2024-08-23 NOTE — ED Provider Triage Note (Signed)
 Emergency Medicine Provider Triage Evaluation Note  Shelley [name] , a 79 y.o. female  was evaluated in triage.  Pt complains of weakness.  Pt feels like her muscles are wasting away.  Pt reports having an endoscopy because she feels like their is something in her throat and stomach, but nothing was found   Review of Systems  Positive: weakness Negative: fever  Physical Exam  BP 94/61   Pulse 92   Temp 97.7 F (36.5 C) (Oral)   Resp 18   SpO2 96%  Gen:   Awake, no distress, pale  Resp:  Normal effort  MSK:   Moves extremities without difficulty  Other:    Medical Decision Making  Medically screening exam initiated at 8:50 PM.  Appropriate orders placed.  Shelley [name] was informed that the remainder of the evaluation will be completed by another provider, this initial triage assessment does not replace that evaluation, and the importance of remaining in the ED until their evaluation is complete.     Flint Sonny POUR, PA-C 08/23/24 2052

## 2024-08-24 LAB — URINALYSIS, ROUTINE W REFLEX MICROSCOPIC
Bilirubin Urine: NEGATIVE
Glucose, UA: NEGATIVE mg/dL
Hgb urine dipstick: NEGATIVE
Ketones, ur: NEGATIVE mg/dL
Nitrite: NEGATIVE
Protein, ur: NEGATIVE mg/dL
Specific Gravity, Urine: 1.013 (ref 1.005–1.030)
pH: 6 (ref 5.0–8.0)

## 2024-08-24 NOTE — Telephone Encounter (Signed)
 Pt stated that she went to the ER last night due to weakness and was evaluated. Pt stated that he labs values have improved.  Pt verbalized understanding with all questions answered.

## 2024-08-24 NOTE — ED Provider Notes (Signed)
 " [facility]ENC[facility] DEPT [facility] Emergency Department Provider Note MRN:  [id number]  Arrival date & time: 08/24/2024     Chief Complaint   Weakness   History of Present Illness   Shelley [name] is a 79 y.o. year-old female with a history of chronic fatigue syndrome, IBS, depression presenting to the ED with chief complaint of weakness.  Patient has been having issues with her health for the past 6 months.  Had some headaches for few weeks that went away.  Having continued weakness all over, aches and pains all over, poor appetite.  No issues with speech or vision, denies specific pain to the chest, no shortness of breath, no abdominal pain.  Feels like she is losing muscles in her legs.  Concerned about her low blood counts.  Review of Systems  A thorough review of systems was obtained and all systems are negative except as noted in the HPI and PMH.   Patient's Health History    Past Medical History:  Diagnosis Date   Anxiety    Arthritis    osteoarthritis- back, knees, hands   Chronic fatigue syndrome    Complication of anesthesia    had decreased temp   Depression    Diverticulosis    Eczema    GERD (gastroesophageal reflux disease)    History of hiatal hernia    IBS (irritable bowel syndrome)    Internal hemorrhoids    Macular degeneration    Seizures (HCC)    migraines    Past Surgical History:  Procedure Laterality Date   ABDOMINAL HYSTERECTOMY     BLADDER SUSPENSION     Prolasped bladder/mesh: revision surgeon -[facility] remains with retention and constipationneeding more surgery   BUNIONECTOMY     bilateral   CATARACT EXTRACTION, BILATERAL Bilateral    COLONOSCOPY WITH PROPOFOL  N/A 01/05/2015   Procedure: COLONOSCOPY WITH PROPOFOL ;  Surgeon: Elsie Cree, MD;  Location: [facility];  Service: Endoscopy;  Laterality: N/A;   ESOPHAGOGASTRODUODENOSCOPY (EGD) WITH PROPOFOL  N/A 01/05/2015   Procedure: ESOPHAGOGASTRODUODENOSCOPY (EGD) WITH PROPOFOL ;  Surgeon:  Elsie Cree, MD;  Location: [facility];  Service: Endoscopy;  Laterality: N/A;   FACELIFT W/BLEPHAROPLASTY     NASAL RECONSTRUCTION     THERAPEUTIC ABORTION     '93    Family History  Problem Relation Age of Onset   Emphysema Mother        Died at 8   Heart failure Father    Hypertension Father    Alcohol  abuse Sister        x2   Diabetes type II Brother    Alcohol  abuse Brother        x3   Asthma Maternal Grandfather    Headache Neg Hx    Migraines Neg Hx     Social History   Socioeconomic History   Marital status: Divorced    Spouse name: Not on file   Number of children: 2   Years of education: Not on file   Highest education level: Not on file  Occupational History   Occupation: Disabled    Employer: RETIRED  Tobacco Use   Smoking status: Never   Smokeless tobacco: Never  Vaping Use   Vaping status: Never Used  Substance and Sexual Activity   Alcohol  use: No   Drug use: No   Sexual activity: Not on file  Other Topics Concern   Not on file  Social History Narrative   Pt lives alone    Retired  Social Drivers of Health   Tobacco Use: Low Risk (08/12/2024)   Received from [facility]   Patient History    Smoking Tobacco Use: Never    Smokeless Tobacco Use: Never    Passive Exposure: Never  Financial Resource Strain: Low Risk (07/08/2024)   Received from [facility]   Overall Financial Resource Strain (CARDIA)    How hard is it for you to pay for the very basics like food, housing, medical care, and heating?: Not hard at all  Food Insecurity: No Food Insecurity (07/08/2024)   Received from [facility]   Epic    Within the past 12 months, you worried that your food would run out before you got the money to buy more.: Never true    Within the past 12 months, the food you bought just didn't last and you didn't have money to get more.: Never true  Transportation Needs: No Transportation Needs (07/08/2024)   Received from [facility]     In the past 12 months, has lack of transportation kept you from medical appointments or from getting medications?: No    In the past 12 months, has lack of transportation kept you from meetings, work, or from getting things needed for daily living?: No  Physical Activity: Inactive (07/08/2024)   Received from [facility]   Exercise Vital Sign    On average, how many days per week do you engage in moderate to strenuous exercise (like a brisk walk)?: 0 days    Minutes of Exercise per Session: Not on file  Stress: No Stress Concern Present (07/08/2024)   Received from [facility] - Occupational Stress Questionnaire    Do you feel stress - tense, restless, nervous, or anxious, or unable to sleep at night because your mind is troubled all the time - these days?: Not at all  Social Connections: Socially Integrated (07/08/2024)   Received from [facility]   Social Network    How would you rate your social network (family, work, friends)?: Good participation with social networks  Intimate Partner Violence: Not At Risk (07/08/2024)   Received from [facility]   HITS    Over the last 12 months how often did your partner physically hurt you?: Never    Over the last 12 months how often did your partner insult you or talk down to you?: Never    Over the last 12 months how often did your partner threaten you with physical harm?: Never    Over the last 12 months how often did your partner scream or curse at you?: Never  Depression (PHQ2-9): Low Risk (06/19/2022)   Depression (PHQ2-9)    PHQ-2 Score: 0  Alcohol  Screen: Not on file  Housing: Low Risk (07/08/2024)   Received from [facility]    In the last 12 months, was there a time when you were not able to pay the mortgage or rent on time?: No    In the past 12 months, how many times have you moved where you were living?: 1    At any time in the past 12 months, were you homeless or  living in a shelter (including now)?: No  Utilities: Not At Risk (07/08/2024)   Received from [facility]    In the past 12 months has the electric, gas, oil, or water company threatened to shut off services in your home?: No  Health  Literacy: Not on file     Physical Exam   Vitals:   08/23/24 2018 08/23/24 2252  BP: 94/61 105/64  Pulse: 92 87  Resp: 18 15  Temp: 97.7 F (36.5 C) 98.5 F (36.9 C)  SpO2: 96% 98%    CONSTITUTIONAL: Well-appearing, NAD NEURO/PSYCH:  Alert and oriented x 3, no focal deficits EYES:  eyes equal and reactive ENT/NECK:  no LAD, no JVD CARDIO: Regular rate, well-perfused, normal S1 and S2 PULM:  CTAB no wheezing or rhonchi GI/GU:  non-distended, non-tender MSK/SPINE:  No gross deformities, no edema SKIN:  no rash, atraumatic   *Additional and/or pertinent findings included in MDM below  Diagnostic and Interventional Summary    EKG Interpretation Date/Time:  Sunday August 23 2024 20:22:53 EST Ventricular Rate:  91 PR Interval:  126 QRS Duration:  74 QT Interval:  356 QTC Calculation: 437 R Axis:   76  Text Interpretation: Normal sinus rhythm Low voltage QRS Borderline ECG When compared with ECG of 27-Mar-2024 01:10, PREVIOUS ECG IS PRESENT Confirmed by Theadore Sharper [id number]) on 08/24/2024 1:26:40 AM       Labs Reviewed  CBC - Abnormal; Notable for the following components:      Result Value   RBC 3.40 (*)    Hemoglobin 8.9 (*)    HCT 29.7 (*)    Platelets 552 (*)    All other components within normal limits  BASIC METABOLIC PANEL WITH GFR - Abnormal; Notable for the following components:   Glucose, Bld 117 (*)    All other components within normal limits  URINALYSIS, ROUTINE W REFLEX MICROSCOPIC - Abnormal; Notable for the following components:   APPearance HAZY (*)    Leukocytes,Ua LARGE (*)    Bacteria, UA FEW (*)    All other components within normal limits  TYPE AND SCREEN  ABO/RH    No orders to display     Medications - No data to display   Procedures  /  Critical Care Procedures  ED Course and Medical Decision Making  Initial Impression and Ddx Multiple chronic complaints, has seen multiple specialists, including hematology, gastroenterology, neurology.  She is concerned that she does not have an answer.  She is completely normal vital signs, awake and alert, normal neurological exam.  With poor appetite she does seem to be losing weight.  Underlying malignancy is considered.  Other considerations include electrolyte disturbance, worsening anemia.  Past medical/surgical history that increases complexity of ED encounter: Chronic fatigue syndrome  Interpretation of Diagnostics I personally reviewed the Laboratory Testing and my interpretation is as follows: No significant blood count or electrolyte disturbance.  Mild anemia.  She reports her hemoglobin more recently has been 7 and so hemoglobin of 8.9 this evening is reassuring.    Patient Reassessment and Ultimate Disposition/Management     Given the chronicity and normal vitals and reassuring workup and reassuring exam there is no indication of emergent process at this time, patient encouraged to follow-up with her regular doctors for further outpatient diagnostics if needed.  Patient management required discussion with the following services or consulting groups:  None  Complexity of Problems Addressed Acute illness or injury that poses threat of life of bodily function  Additional Data Reviewed and Analyzed Further history obtained from: Prior labs/imaging results  Additional Factors Impacting ED Encounter Risk Consideration of hospitalization  Sharper HERO. Theadore, MD [facility] Emergency Medicine [facility] [email or web address]  Final Clinical Impressions(s) / ED Diagnoses     ICD-10-CM  1. Weakness  R53.1       ED Discharge Orders     None        Discharge Instructions Discussed with and Provided  to Patient:    Discharge Instructions      You were evaluated in the Emergency Department and after careful evaluation, we did not find any emergent condition requiring admission or further testing in the hospital.  Your exam/testing today is overall reassuring.  Recommend continued follow-up with your primary care doctor to discuss your symptoms.  Please return to the Emergency Department if you experience any worsening of your condition.   Thank you for allowing us  to be a part of your care.      Theadore Ozell HERO, MD 08/24/24 [phone]  "

## 2024-08-24 NOTE — Discharge Instructions (Addendum)
You were evaluated in the Emergency Department and after careful evaluation, we did not find any emergent condition requiring admission or further testing in the hospital.  Your exam/testing today is overall reassuring.  Recommend continued follow-up with your primary care doctor to discuss your symptoms.  Please return to the Emergency Department if you experience any worsening of your condition.   Thank you for allowing [place] to be a part of your care.
